# Patient Record
Sex: Female | Born: 1955
Health system: Southern US, Community
[De-identification: ages and names within clinical notes are randomized; demographics above are authoritative.]

## PROBLEM LIST (undated history)

## (undated) DIAGNOSIS — I10 Essential (primary) hypertension: Secondary | ICD-10-CM

## (undated) DIAGNOSIS — F32A Depression, unspecified: Secondary | ICD-10-CM

## (undated) DIAGNOSIS — F329 Major depressive disorder, single episode, unspecified: Secondary | ICD-10-CM

## (undated) DIAGNOSIS — F419 Anxiety disorder, unspecified: Secondary | ICD-10-CM

## (undated) DIAGNOSIS — K219 Gastro-esophageal reflux disease without esophagitis: Secondary | ICD-10-CM

## (undated) DIAGNOSIS — K759 Inflammatory liver disease, unspecified: Secondary | ICD-10-CM

## (undated) DIAGNOSIS — R112 Nausea with vomiting, unspecified: Secondary | ICD-10-CM

## (undated) DIAGNOSIS — E785 Hyperlipidemia, unspecified: Secondary | ICD-10-CM

## (undated) DIAGNOSIS — Z9889 Other specified postprocedural states: Secondary | ICD-10-CM

## (undated) HISTORY — PX: BREAST EXCISIONAL BIOPSY: SUR124

## (undated) HISTORY — DX: Hyperlipidemia, unspecified: E78.5

## (undated) HISTORY — DX: Essential (primary) hypertension: I10

## (undated) HISTORY — PX: CERVICAL BIOPSY  W/ LOOP ELECTRODE EXCISION: SUR135

---
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
## 1985-05-10 HISTORY — PX: TUBAL LIGATION: SHX77

## 1998-07-24 ENCOUNTER — Other Ambulatory Visit: Admission: RE | Admit: 1998-07-24 | Discharge: 1998-07-24 | Payer: Self-pay | Admitting: Obstetrics and Gynecology

## 1998-12-23 ENCOUNTER — Other Ambulatory Visit: Admission: RE | Admit: 1998-12-23 | Discharge: 1998-12-23 | Payer: Self-pay | Admitting: Obstetrics and Gynecology

## 1999-07-17 ENCOUNTER — Other Ambulatory Visit: Admission: RE | Admit: 1999-07-17 | Discharge: 1999-07-17 | Payer: Self-pay | Admitting: Obstetrics and Gynecology

## 2000-02-19 ENCOUNTER — Other Ambulatory Visit: Admission: RE | Admit: 2000-02-19 | Discharge: 2000-02-19 | Payer: Self-pay | Admitting: Obstetrics and Gynecology

## 2000-06-28 ENCOUNTER — Encounter: Payer: Self-pay | Admitting: *Deleted

## 2000-06-28 ENCOUNTER — Encounter: Admission: RE | Admit: 2000-06-28 | Discharge: 2000-06-28 | Payer: Self-pay | Admitting: *Deleted

## 2000-07-18 ENCOUNTER — Other Ambulatory Visit: Admission: RE | Admit: 2000-07-18 | Discharge: 2000-07-18 | Payer: Self-pay | Admitting: Obstetrics and Gynecology

## 2000-12-27 ENCOUNTER — Encounter: Admission: RE | Admit: 2000-12-27 | Discharge: 2000-12-27 | Payer: Self-pay | Admitting: *Deleted

## 2000-12-27 ENCOUNTER — Encounter: Payer: Self-pay | Admitting: *Deleted

## 2001-04-26 ENCOUNTER — Other Ambulatory Visit: Admission: RE | Admit: 2001-04-26 | Discharge: 2001-04-26 | Payer: Self-pay | Admitting: Obstetrics and Gynecology

## 2002-03-06 ENCOUNTER — Encounter: Payer: Self-pay | Admitting: *Deleted

## 2002-03-06 ENCOUNTER — Encounter: Admission: RE | Admit: 2002-03-06 | Discharge: 2002-03-06 | Payer: Self-pay | Admitting: *Deleted

## 2003-03-19 ENCOUNTER — Encounter: Admission: RE | Admit: 2003-03-19 | Discharge: 2003-03-19 | Payer: Self-pay | Admitting: *Deleted

## 2004-03-20 ENCOUNTER — Encounter: Admission: RE | Admit: 2004-03-20 | Discharge: 2004-03-20 | Payer: Self-pay | Admitting: Obstetrics and Gynecology

## 2005-05-20 ENCOUNTER — Encounter: Admission: RE | Admit: 2005-05-20 | Discharge: 2005-05-20 | Payer: Self-pay | Admitting: Obstetrics and Gynecology

## 2006-05-10 HISTORY — PX: LIVER BIOPSY: SHX301

## 2006-06-14 ENCOUNTER — Encounter: Admission: RE | Admit: 2006-06-14 | Discharge: 2006-06-14 | Payer: Self-pay | Admitting: Obstetrics and Gynecology

## 2007-02-24 ENCOUNTER — Ambulatory Visit: Payer: Self-pay | Admitting: Family Medicine

## 2007-05-31 ENCOUNTER — Ambulatory Visit: Payer: Self-pay | Admitting: Unknown Physician Specialty

## 2007-05-31 LAB — HM COLONOSCOPY

## 2007-06-26 ENCOUNTER — Encounter: Admission: RE | Admit: 2007-06-26 | Discharge: 2007-06-26 | Payer: Self-pay | Admitting: Obstetrics and Gynecology

## 2007-06-29 ENCOUNTER — Ambulatory Visit: Payer: Self-pay | Admitting: Surgery

## 2007-06-29 HISTORY — PX: CHOLECYSTECTOMY: SHX55

## 2008-07-02 ENCOUNTER — Encounter: Admission: RE | Admit: 2008-07-02 | Discharge: 2008-07-02 | Payer: Self-pay | Admitting: Obstetrics and Gynecology

## 2008-10-21 ENCOUNTER — Ambulatory Visit: Payer: Self-pay | Admitting: Unknown Physician Specialty

## 2009-01-22 ENCOUNTER — Ambulatory Visit: Payer: Self-pay | Admitting: Family Medicine

## 2009-07-07 ENCOUNTER — Encounter: Admission: RE | Admit: 2009-07-07 | Discharge: 2009-07-07 | Payer: Self-pay | Admitting: Obstetrics and Gynecology

## 2010-06-11 ENCOUNTER — Other Ambulatory Visit: Payer: Self-pay | Admitting: Obstetrics and Gynecology

## 2010-06-11 DIAGNOSIS — Z1239 Encounter for other screening for malignant neoplasm of breast: Secondary | ICD-10-CM

## 2010-07-08 ENCOUNTER — Ambulatory Visit
Admission: RE | Admit: 2010-07-08 | Discharge: 2010-07-08 | Disposition: A | Discharge status: Home / Self Care | Payer: BC Managed Care – PPO | Source: Ambulatory Visit | Attending: Obstetrics and Gynecology | Admitting: Obstetrics and Gynecology

## 2010-07-08 DIAGNOSIS — Z1239 Encounter for other screening for malignant neoplasm of breast: Secondary | ICD-10-CM

## 2010-08-06 ENCOUNTER — Ambulatory Visit: Payer: Self-pay | Admitting: Unknown Physician Specialty

## 2011-06-02 ENCOUNTER — Other Ambulatory Visit: Payer: Self-pay | Admitting: Obstetrics and Gynecology

## 2011-06-02 DIAGNOSIS — Z1231 Encounter for screening mammogram for malignant neoplasm of breast: Secondary | ICD-10-CM

## 2011-08-17 ENCOUNTER — Ambulatory Visit
Admission: RE | Admit: 2011-08-17 | Discharge: 2011-08-17 | Disposition: A | Discharge status: Home / Self Care | Payer: BC Managed Care – PPO | Source: Ambulatory Visit | Attending: Obstetrics and Gynecology | Admitting: Obstetrics and Gynecology

## 2011-08-17 DIAGNOSIS — Z1231 Encounter for screening mammogram for malignant neoplasm of breast: Secondary | ICD-10-CM

## 2012-07-19 ENCOUNTER — Other Ambulatory Visit: Payer: Self-pay

## 2012-07-19 ENCOUNTER — Ambulatory Visit: Payer: Self-pay | Admitting: Unknown Physician Specialty

## 2012-07-19 DIAGNOSIS — Z1231 Encounter for screening mammogram for malignant neoplasm of breast: Secondary | ICD-10-CM

## 2012-08-23 ENCOUNTER — Ambulatory Visit
Admission: RE | Admit: 2012-08-23 | Discharge: 2012-08-23 | Disposition: A | Discharge status: Home / Self Care | Payer: BC Managed Care – PPO | Source: Ambulatory Visit

## 2012-08-23 ENCOUNTER — Other Ambulatory Visit: Payer: Self-pay | Admitting: Obstetrics and Gynecology

## 2012-08-23 DIAGNOSIS — Z1231 Encounter for screening mammogram for malignant neoplasm of breast: Secondary | ICD-10-CM

## 2012-08-23 DIAGNOSIS — R928 Other abnormal and inconclusive findings on diagnostic imaging of breast: Secondary | ICD-10-CM

## 2012-09-06 ENCOUNTER — Ambulatory Visit
Admission: RE | Admit: 2012-09-06 | Discharge: 2012-09-06 | Disposition: A | Discharge status: Home / Self Care | Payer: BC Managed Care – PPO | Source: Ambulatory Visit | Attending: Obstetrics and Gynecology | Admitting: Obstetrics and Gynecology

## 2012-09-06 DIAGNOSIS — R928 Other abnormal and inconclusive findings on diagnostic imaging of breast: Secondary | ICD-10-CM

## 2013-11-06 LAB — LIPID PANEL
Cholesterol: 207 mg/dL — AB (ref 0–200)
HDL: 63 mg/dL (ref 35–70)
LDL Cholesterol: 126 mg/dL
LDl/HDL Ratio: 2
Triglycerides: 88 mg/dL (ref 40–160)

## 2013-11-06 LAB — HEPATIC FUNCTION PANEL
ALT: 39 U/L — AB (ref 7–35)
AST: 32 U/L (ref 13–35)

## 2013-11-06 LAB — HEMOGLOBIN A1C: HEMOGLOBIN A1C: 6 % (ref 4.0–6.0)

## 2014-04-16 LAB — HM DEXA SCAN

## 2014-08-10 ENCOUNTER — Other Ambulatory Visit: Payer: Self-pay

## 2014-08-10 DIAGNOSIS — F329 Major depressive disorder, single episode, unspecified: Secondary | ICD-10-CM | POA: Insufficient documentation

## 2014-08-10 DIAGNOSIS — G43009 Migraine without aura, not intractable, without status migrainosus: Secondary | ICD-10-CM | POA: Insufficient documentation

## 2014-08-10 DIAGNOSIS — G4700 Insomnia, unspecified: Secondary | ICD-10-CM | POA: Insufficient documentation

## 2014-08-10 DIAGNOSIS — K743 Primary biliary cirrhosis: Secondary | ICD-10-CM | POA: Insufficient documentation

## 2014-08-10 DIAGNOSIS — K219 Gastro-esophageal reflux disease without esophagitis: Secondary | ICD-10-CM | POA: Insufficient documentation

## 2014-08-10 DIAGNOSIS — I10 Essential (primary) hypertension: Secondary | ICD-10-CM | POA: Insufficient documentation

## 2014-08-10 DIAGNOSIS — Z0000 Encounter for general adult medical examination without abnormal findings: Secondary | ICD-10-CM | POA: Insufficient documentation

## 2014-08-10 DIAGNOSIS — F32A Depression, unspecified: Secondary | ICD-10-CM | POA: Insufficient documentation

## 2014-08-10 DIAGNOSIS — A048 Other specified bacterial intestinal infections: Secondary | ICD-10-CM | POA: Insufficient documentation

## 2014-08-10 DIAGNOSIS — G43809 Other migraine, not intractable, without status migrainosus: Secondary | ICD-10-CM | POA: Insufficient documentation

## 2014-08-10 DIAGNOSIS — E785 Hyperlipidemia, unspecified: Secondary | ICD-10-CM | POA: Insufficient documentation

## 2014-08-10 DIAGNOSIS — E7800 Pure hypercholesterolemia, unspecified: Secondary | ICD-10-CM | POA: Insufficient documentation

## 2014-09-20 ENCOUNTER — Other Ambulatory Visit: Payer: Self-pay | Admitting: Obstetrics and Gynecology

## 2014-09-20 DIAGNOSIS — R928 Other abnormal and inconclusive findings on diagnostic imaging of breast: Secondary | ICD-10-CM

## 2014-10-01 ENCOUNTER — Other Ambulatory Visit: Payer: Self-pay | Admitting: Obstetrics and Gynecology

## 2014-10-01 ENCOUNTER — Ambulatory Visit
Admission: RE | Admit: 2014-10-01 | Discharge: 2014-10-01 | Disposition: A | Discharge status: Home / Self Care | Payer: BLUE CROSS/BLUE SHIELD | Source: Ambulatory Visit | Attending: Obstetrics and Gynecology | Admitting: Obstetrics and Gynecology

## 2014-10-01 DIAGNOSIS — R928 Other abnormal and inconclusive findings on diagnostic imaging of breast: Secondary | ICD-10-CM

## 2014-10-02 ENCOUNTER — Other Ambulatory Visit: Payer: Self-pay | Admitting: Obstetrics and Gynecology

## 2014-10-02 DIAGNOSIS — R928 Other abnormal and inconclusive findings on diagnostic imaging of breast: Secondary | ICD-10-CM

## 2014-10-03 ENCOUNTER — Ambulatory Visit
Admission: RE | Admit: 2014-10-03 | Discharge: 2014-10-03 | Disposition: A | Discharge status: Home / Self Care | Payer: BLUE CROSS/BLUE SHIELD | Source: Ambulatory Visit | Attending: Obstetrics and Gynecology | Admitting: Obstetrics and Gynecology

## 2014-10-03 DIAGNOSIS — R928 Other abnormal and inconclusive findings on diagnostic imaging of breast: Secondary | ICD-10-CM

## 2014-10-03 HISTORY — PX: BREAST BIOPSY: SHX20

## 2014-10-09 ENCOUNTER — Ambulatory Visit (INDEPENDENT_AMBULATORY_CARE_PROVIDER_SITE_OTHER): Payer: BLUE CROSS/BLUE SHIELD | Admitting: Family Medicine

## 2014-10-09 ENCOUNTER — Encounter: Payer: Self-pay | Admitting: Family Medicine

## 2014-10-09 VITALS — BP 128/80 | HR 84 | Temp 98.2°F | Resp 16 | Ht 65.5 in | Wt 212.0 lb

## 2014-10-09 DIAGNOSIS — I10 Essential (primary) hypertension: Secondary | ICD-10-CM

## 2014-10-09 DIAGNOSIS — E7800 Pure hypercholesterolemia, unspecified: Secondary | ICD-10-CM

## 2014-10-09 DIAGNOSIS — K743 Primary biliary cirrhosis: Secondary | ICD-10-CM

## 2014-10-09 DIAGNOSIS — R739 Hyperglycemia, unspecified: Secondary | ICD-10-CM | POA: Diagnosis not present

## 2014-10-09 NOTE — Progress Notes (Signed)
Patient ID: Michaela Hardy, female   DOB: [birth year], 59 y.o.   MRN: [id number] Patient: Michaela Hardy, Female    DOB: [birth year], 59 y.o.   MRN: [id number] Visit Date: 10/09/2014  Today's Provider: Margarita Rana, MD   Chief Complaint  Patient presents with  . Annual Exam   Subjective:  Michaela Hardy is a 59 y.o. female who presents today for health maintenance and complete physical. She feels well. She reports exercising 3 times a week cardio and weights. Patient reports that over the weekend she walks a few miles. She reports she is sleeping well.   Review of Systems  Constitutional: Negative.   HENT: Negative.   Eyes: Positive for discharge. Negative for photophobia, pain, redness, itching and visual disturbance.  Respiratory: Negative.   Cardiovascular: Negative.   Gastrointestinal: Negative.   Endocrine: Negative.   Genitourinary: Negative.   Allergic/Immunologic: Negative.   Neurological: Negative.   Hematological: Negative.   Psychiatric/Behavioral: Negative.     History   Social History  . Marital Status: Married    Spouse Name: N/A  . Number of Children: N/A  . Years of Education: N/A   Occupational History  . Not on file.   Social History Main Topics  . Smoking status: Former Smoker -- 1.50 packs/day for 40 years    Types: Cigarettes    Quit date: 05/10/2004  . Smokeless tobacco: Never Used  . Alcohol Use: 3.0 oz/week    5 Glasses of wine per week  . Drug Use: No  . Sexual Activity: Not Currently   Other Topics Concern  . Not on file   Social History Narrative   Patient Active Problem List   Diagnosis Date Noted  . Atypical migraine 08/10/2014  . Routine general medical examination at a health care facility 08/10/2014  . Clinical depression 08/10/2014  . Acid reflux 08/10/2014  . Helicobacter pylori gastrointestinal tract infection 08/10/2014  . Hypercholesteremia 08/10/2014  . BP (high blood pressure) 08/10/2014  . Cannot sleep 08/10/2014   . Cirrhosis, primary biliary 08/10/2014     Past Medical History  Diagnosis Date  . Hyperlipidemia   . Hypertension     Past Surgical History  Procedure Laterality Date  . Appendectomy  1976  . Cholecystectomy  06/29/2007  . Tubal ligation  1987  . Liver biopsy  2008  . Breast biopsy Left 10/03/2014    Dr. Autumn Patty    Her family history includes Cancer in her father; Cerebrovascular Accident in her father; Coronary artery disease in her father; Glaucoma in her father; Heart attack in her father; Hypertension in her father and mother; Pancreatitis in her mother; Parkinson's disease in her maternal grandmother; Thyroid disease in her mother.    Previous Medications   CALCIUM CARBONATE (OS-CAL) 600 MG TABS TABLET    Take 1 tablet by mouth 2 (two) times daily.   CITALOPRAM (CELEXA) 20 MG TABLET    Take 20 mg by mouth once.   LOSARTAN-HYDROCHLOROTHIAZIDE (HYZAAR) 50-12.5 MG PER TABLET    Take 1 tablet by mouth daily.   MULTIPLE VITAMIN TABLET    Take 1 tablet by mouth daily.   OMEPRAZOLE (PRILOSEC) 20 MG CAPSULE    Take 1 capsule by mouth daily.   SIMVASTATIN (ZOCOR) 10 MG TABLET    Take 1 tablet by mouth daily.   URSODIOL (ACTIGALL) 300 MG CAPSULE    Take 2 capsules by mouth 2 (two) times daily.    Patient Care Team: Margarita Rana, MD as PCP -  General (Family Medicine)     Objective:  BP 128/80 mmHg  Pulse 84  Temp(Src) 98.2 F (36.8 C) (Oral)  Resp 16  Ht 5' 5.5" (1.664 m)  Wt 212 lb (96.163 kg)  BMI 34.73 kg/m2 Vitals:  Filed Vitals:   10/09/14 1546  BP: 128/80  Pulse: 84  Temp: 98.2 F (36.8 C)  TempSrc: Oral  Resp: 16  Height: 5' 5.5" (1.664 m)  Weight: 212 lb (96.163 kg)    Physical Exam  Constitutional: She is oriented to person, place, and time. She appears well-developed and well-nourished.  HENT:  Head: Normocephalic and atraumatic.  Right Ear: Tympanic membrane, external ear and ear canal normal.  Left Ear: Tympanic membrane, external ear and ear  canal normal.  Nose: Nose normal.  Mouth/Throat: Uvula is midline, oropharynx is clear and moist and mucous membranes are normal.  Eyes: Conjunctivae, EOM and lids are normal. Pupils are equal, round, and reactive to light.  Neck: Trachea normal and normal range of motion. Neck supple. Carotid bruit is not present. No thyroid mass and no thyromegaly present.  Cardiovascular: Normal rate, regular rhythm and normal heart sounds.   Pulmonary/Chest: Effort normal and breath sounds normal.  Abdominal: Soft. Normal appearance and bowel sounds are normal. There is no hepatosplenomegaly. There is no tenderness.  Musculoskeletal: Normal range of motion.  Lymphadenopathy:    She has no cervical adenopathy.    She has no axillary adenopathy.  Neurological: She is alert and oriented to person, place, and time. She has normal strength. No cranial nerve deficit.  Skin: Skin is warm, dry and intact.  Psychiatric: She has a normal mood and affect. Her speech is normal and behavior is normal. Judgment and thought content normal. Cognition and memory are normal.     Depression Screen PHQ 2/9 Scores 10/09/2014  PHQ - 2 Score 0      Assessment & Plan:     Routine Health Maintenance and Physical Exam  Will check labs today also.    Exercise Activities and Dietary recommendations Goals    . Exercise 150 minutes per week (moderate activity)       Immunization History  Administered Date(s) Administered  . Hepatitis A 11/27/2008, 05/30/2009  . Hepatitis B 11/27/2008, 12/26/2008, 05/30/2009  . Td 12/03/1998  . Tdap 06/29/2012    Health Maintenance  Topic Date Due  . HIV Screening  06/07/1971  . INFLUENZA VACCINE  12/09/2014  . MAMMOGRAM  10/02/2016  . COLONOSCOPY  05/30/2017  . PAP SMEAR  09/23/2017  . TETANUS/TDAP  06/29/2022      Discussed health benefits of physical activity, and encouraged her to engage in regular exercise appropriate for her age and condition.     ------------------------------------------------------------------------------------------------------------  Patient seen and examined by Dr. Jerrell Belfast, and note scribed by Philbert Riser. Dimas, CMA.  I have reviewed the document for accuracy and completeness and I agree with above. Jerrell Belfast, MD

## 2014-10-16 LAB — LIPID PANEL
Chol/HDL Ratio: 3.8 ratio units (ref 0.0–4.4)
Cholesterol, Total: 231 mg/dL — ABNORMAL HIGH (ref 100–199)
HDL: 61 mg/dL (ref >39)
LDL Calculated: 146 mg/dL — ABNORMAL HIGH (ref 0–99)
TRIGLYCERIDES: 122 mg/dL (ref 0–149)
VLDL CHOLESTEROL CAL: 24 mg/dL (ref 5–40)

## 2014-10-16 LAB — COMPREHENSIVE METABOLIC PANEL
A/G RATIO: 1.5 (ref 1.1–2.5)
ALK PHOS: 106 IU/L (ref 39–117)
ALT: 27 IU/L (ref 0–32)
AST: 24 IU/L (ref 0–40)
Albumin: 4.4 g/dL (ref 3.5–5.5)
BUN/Creatinine Ratio: 15 (ref 9–23)
BUN: 11 mg/dL (ref 6–24)
Bilirubin Total: 0.3 mg/dL (ref 0.0–1.2)
CALCIUM: 9.5 mg/dL (ref 8.7–10.2)
CO2: 27 mmol/L (ref 18–29)
CREATININE: 0.72 mg/dL (ref 0.57–1.00)
Chloride: 97 mmol/L (ref 97–108)
GFR calc Af Amer: 107 mL/min/{1.73_m2} (ref >59)
GFR calc non Af Amer: 93 mL/min/{1.73_m2} (ref >59)
GLUCOSE: 117 mg/dL — AB (ref 65–99)
Globulin, Total: 2.9 g/dL (ref 1.5–4.5)
Potassium: 4.8 mmol/L (ref 3.5–5.2)
Sodium: 140 mmol/L (ref 134–144)
TOTAL PROTEIN: 7.3 g/dL (ref 6.0–8.5)

## 2014-10-16 LAB — CBC WITH DIFFERENTIAL/PLATELET
Basophils Absolute: 0 10*3/uL (ref 0.0–0.2)
Basos: 1 %
EOS (ABSOLUTE): 0.1 10*3/uL (ref 0.0–0.4)
Eos: 1 %
HEMATOCRIT: 38.4 % (ref 34.0–46.6)
Hemoglobin: 12.8 g/dL (ref 11.1–15.9)
IMMATURE GRANULOCYTES: 0 %
Immature Grans (Abs): 0 10*3/uL (ref 0.0–0.1)
LYMPHS: 25 %
Lymphocytes Absolute: 1.2 10*3/uL (ref 0.7–3.1)
MCH: 30.3 pg (ref 26.6–33.0)
MCHC: 33.3 g/dL (ref 31.5–35.7)
MCV: 91 fL (ref 79–97)
Monocytes Absolute: 0.5 10*3/uL (ref 0.1–0.9)
Monocytes: 11 %
NEUTROS PCT: 62 %
Neutrophils Absolute: 3 10*3/uL (ref 1.4–7.0)
Platelets: 373 10*3/uL (ref 150–379)
RBC: 4.23 x10E6/uL (ref 3.77–5.28)
RDW: 14.3 % (ref 12.3–15.4)
WBC: 4.9 10*3/uL (ref 3.4–10.8)

## 2014-10-16 LAB — HEMOGLOBIN A1C
Est. average glucose Bld gHb Est-mCnc: 134 mg/dL
HEMOGLOBIN A1C: 6.3 % — AB (ref 4.8–5.6)

## 2014-10-16 LAB — TSH: TSH: 3.78 u[IU]/mL (ref 0.450–4.500)

## 2014-10-22 ENCOUNTER — Other Ambulatory Visit: Payer: Self-pay | Admitting: General Surgery

## 2014-10-22 DIAGNOSIS — N6022 Fibroadenosis of left breast: Secondary | ICD-10-CM

## 2014-11-04 ENCOUNTER — Other Ambulatory Visit: Payer: Self-pay | Admitting: Family Medicine

## 2014-11-04 DIAGNOSIS — F329 Major depressive disorder, single episode, unspecified: Secondary | ICD-10-CM

## 2014-11-04 DIAGNOSIS — F32A Depression, unspecified: Secondary | ICD-10-CM

## 2014-11-05 ENCOUNTER — Other Ambulatory Visit: Payer: Self-pay | Admitting: General Surgery

## 2014-11-05 DIAGNOSIS — N6022 Fibroadenosis of left breast: Secondary | ICD-10-CM

## 2014-11-15 DIAGNOSIS — K743 Primary biliary cirrhosis: Secondary | ICD-10-CM | POA: Insufficient documentation

## 2014-11-18 ENCOUNTER — Encounter (HOSPITAL_BASED_OUTPATIENT_CLINIC_OR_DEPARTMENT_OTHER)
Admission: RE | Admit: 2014-11-18 | Discharge: 2014-11-18 | Disposition: A | Discharge status: Home / Self Care | Payer: BLUE CROSS/BLUE SHIELD | Source: Ambulatory Visit | Attending: General Surgery | Admitting: General Surgery

## 2014-11-18 ENCOUNTER — Ambulatory Visit
Admission: RE | Admit: 2014-11-18 | Discharge: 2014-11-18 | Disposition: A | Discharge status: Home / Self Care | Payer: BLUE CROSS/BLUE SHIELD | Source: Ambulatory Visit | Attending: General Surgery | Admitting: General Surgery

## 2014-11-18 ENCOUNTER — Other Ambulatory Visit: Payer: Self-pay

## 2014-11-18 ENCOUNTER — Encounter (HOSPITAL_BASED_OUTPATIENT_CLINIC_OR_DEPARTMENT_OTHER): Payer: Self-pay | Admitting: *Deleted

## 2014-11-18 DIAGNOSIS — I10 Essential (primary) hypertension: Secondary | ICD-10-CM | POA: Diagnosis not present

## 2014-11-18 DIAGNOSIS — Z79899 Other long term (current) drug therapy: Secondary | ICD-10-CM | POA: Diagnosis not present

## 2014-11-18 DIAGNOSIS — R921 Mammographic calcification found on diagnostic imaging of breast: Secondary | ICD-10-CM | POA: Diagnosis not present

## 2014-11-18 DIAGNOSIS — N6022 Fibroadenosis of left breast: Secondary | ICD-10-CM

## 2014-11-18 DIAGNOSIS — Z859 Personal history of malignant neoplasm, unspecified: Secondary | ICD-10-CM | POA: Diagnosis not present

## 2014-11-18 DIAGNOSIS — L905 Scar conditions and fibrosis of skin: Secondary | ICD-10-CM | POA: Diagnosis not present

## 2014-11-18 DIAGNOSIS — K219 Gastro-esophageal reflux disease without esophagitis: Secondary | ICD-10-CM | POA: Diagnosis not present

## 2014-11-18 DIAGNOSIS — R9431 Abnormal electrocardiogram [ECG] [EKG]: Secondary | ICD-10-CM | POA: Diagnosis not present

## 2014-11-18 DIAGNOSIS — Z87891 Personal history of nicotine dependence: Secondary | ICD-10-CM | POA: Diagnosis not present

## 2014-11-18 DIAGNOSIS — Z6833 Body mass index (BMI) 33.0-33.9, adult: Secondary | ICD-10-CM | POA: Diagnosis not present

## 2014-11-18 LAB — BASIC METABOLIC PANEL
Anion gap: 10 (ref 5–15)
BUN: 7 mg/dL (ref 6–20)
CALCIUM: 9.2 mg/dL (ref 8.9–10.3)
CO2: 28 mmol/L (ref 22–32)
Chloride: 98 mmol/L — ABNORMAL LOW (ref 101–111)
Creatinine, Ser: 0.68 mg/dL (ref 0.44–1.00)
GFR calc non Af Amer: >60 mL/min (ref >60)
GLUCOSE: 104 mg/dL — AB (ref 65–99)
Potassium: 3.6 mmol/L (ref 3.5–5.1)
SODIUM: 136 mmol/L (ref 135–145)

## 2014-11-19 ENCOUNTER — Encounter (HOSPITAL_BASED_OUTPATIENT_CLINIC_OR_DEPARTMENT_OTHER): Payer: Self-pay

## 2014-11-21 ENCOUNTER — Encounter (HOSPITAL_BASED_OUTPATIENT_CLINIC_OR_DEPARTMENT_OTHER): Payer: Self-pay | Admitting: Anesthesiology

## 2014-11-21 ENCOUNTER — Ambulatory Visit
Admission: RE | Admit: 2014-11-21 | Discharge: 2014-11-21 | Disposition: A | Discharge status: Home / Self Care | Payer: BLUE CROSS/BLUE SHIELD | Source: Ambulatory Visit | Attending: General Surgery | Admitting: General Surgery

## 2014-11-21 ENCOUNTER — Ambulatory Visit (HOSPITAL_BASED_OUTPATIENT_CLINIC_OR_DEPARTMENT_OTHER): Payer: BLUE CROSS/BLUE SHIELD | Admitting: Anesthesiology

## 2014-11-21 ENCOUNTER — Ambulatory Visit (HOSPITAL_BASED_OUTPATIENT_CLINIC_OR_DEPARTMENT_OTHER)
Admission: RE | Admit: 2014-11-21 | Discharge: 2014-11-21 | Disposition: A | Discharge status: Home / Self Care | Payer: BLUE CROSS/BLUE SHIELD | Source: Ambulatory Visit | Attending: General Surgery | Admitting: General Surgery

## 2014-11-21 ENCOUNTER — Encounter (HOSPITAL_BASED_OUTPATIENT_CLINIC_OR_DEPARTMENT_OTHER)
Admission: RE | Disposition: A | Discharge status: Home / Self Care | Payer: Self-pay | Source: Ambulatory Visit | Attending: General Surgery

## 2014-11-21 DIAGNOSIS — N6022 Fibroadenosis of left breast: Secondary | ICD-10-CM

## 2014-11-21 DIAGNOSIS — R9431 Abnormal electrocardiogram [ECG] [EKG]: Secondary | ICD-10-CM | POA: Insufficient documentation

## 2014-11-21 DIAGNOSIS — I10 Essential (primary) hypertension: Secondary | ICD-10-CM | POA: Insufficient documentation

## 2014-11-21 DIAGNOSIS — R921 Mammographic calcification found on diagnostic imaging of breast: Secondary | ICD-10-CM | POA: Insufficient documentation

## 2014-11-21 DIAGNOSIS — Z87891 Personal history of nicotine dependence: Secondary | ICD-10-CM | POA: Insufficient documentation

## 2014-11-21 DIAGNOSIS — Z79899 Other long term (current) drug therapy: Secondary | ICD-10-CM | POA: Insufficient documentation

## 2014-11-21 DIAGNOSIS — L905 Scar conditions and fibrosis of skin: Secondary | ICD-10-CM | POA: Insufficient documentation

## 2014-11-21 DIAGNOSIS — K219 Gastro-esophageal reflux disease without esophagitis: Secondary | ICD-10-CM | POA: Insufficient documentation

## 2014-11-21 DIAGNOSIS — Z859 Personal history of malignant neoplasm, unspecified: Secondary | ICD-10-CM | POA: Insufficient documentation

## 2014-11-21 DIAGNOSIS — Z6833 Body mass index (BMI) 33.0-33.9, adult: Secondary | ICD-10-CM | POA: Insufficient documentation

## 2014-11-21 HISTORY — DX: Depression, unspecified: F32.A

## 2014-11-21 HISTORY — DX: Gastro-esophageal reflux disease without esophagitis: K21.9

## 2014-11-21 HISTORY — DX: Other specified postprocedural states: Z98.890

## 2014-11-21 HISTORY — DX: Anxiety disorder, unspecified: F41.9

## 2014-11-21 HISTORY — PX: BREAST LUMPECTOMY WITH RADIOACTIVE SEED LOCALIZATION: SHX6424

## 2014-11-21 HISTORY — DX: Inflammatory liver disease, unspecified: K75.9

## 2014-11-21 HISTORY — DX: Nausea with vomiting, unspecified: R11.2

## 2014-11-21 HISTORY — DX: Major depressive disorder, single episode, unspecified: F32.9

## 2014-11-21 LAB — POCT HEMOGLOBIN-HEMACUE: Hemoglobin: 13.4 g/dL (ref 12.0–15.0)

## 2014-11-21 SURGERY — BREAST LUMPECTOMY WITH RADIOACTIVE SEED LOCALIZATION
Anesthesia: General | Site: Breast | Laterality: Left

## 2014-11-21 MED ORDER — OXYCODONE-ACETAMINOPHEN 5-325 MG PO TABS: 1.0000 | ORAL_TABLET | ORAL | Status: DC | PRN

## 2014-11-21 MED ORDER — FENTANYL CITRATE (PF) 100 MCG/2ML IJ SOLN
INTRAMUSCULAR | Status: DC | PRN
  Administered 2014-11-21: 100 ug via INTRAVENOUS

## 2014-11-21 MED ORDER — LACTATED RINGERS IV SOLN
INTRAVENOUS | Status: DC
  Administered 2014-11-21 (×2): via INTRAVENOUS

## 2014-11-21 MED ORDER — HYDROMORPHONE HCL 1 MG/ML IJ SOLN
INTRAMUSCULAR | Status: AC
  Filled 2014-11-21: qty 1

## 2014-11-21 MED ORDER — FENTANYL CITRATE (PF) 100 MCG/2ML IJ SOLN: 50.0000 ug | INTRAMUSCULAR | Status: DC | PRN

## 2014-11-21 MED ORDER — PROPOFOL 10 MG/ML IV BOLUS
INTRAVENOUS | Status: DC | PRN
  Administered 2014-11-21: 200 mg via INTRAVENOUS

## 2014-11-21 MED ORDER — SCOPOLAMINE 1 MG/3DAYS TD PT72: 1.0000 | MEDICATED_PATCH | Freq: Once | TRANSDERMAL | Status: DC | PRN

## 2014-11-21 MED ORDER — ONDANSETRON HCL 4 MG/2ML IJ SOLN
INTRAMUSCULAR | Status: DC | PRN
Start: 2014-11-21 — End: 2014-11-21
  Administered 2014-11-21: 4 mg via INTRAVENOUS

## 2014-11-21 MED ORDER — LIDOCAINE HCL (CARDIAC) 20 MG/ML IV SOLN
INTRAVENOUS | Status: DC | PRN
  Administered 2014-11-21: 50 mg via INTRAVENOUS

## 2014-11-21 MED ORDER — MIDAZOLAM HCL 2 MG/2ML IJ SOLN
1.0000 mg | INTRAMUSCULAR | Status: DC | PRN
Start: 2014-11-21 — End: 2014-11-21

## 2014-11-21 MED ORDER — BUPIVACAINE HCL (PF) 0.25 % IJ SOLN
INTRAMUSCULAR | Status: DC | PRN
  Administered 2014-11-21: 20 mL

## 2014-11-21 MED ORDER — MIDAZOLAM HCL 5 MG/5ML IJ SOLN
INTRAMUSCULAR | Status: DC | PRN
  Administered 2014-11-21: 2 mg via INTRAVENOUS

## 2014-11-21 MED ORDER — SCOPOLAMINE 1 MG/3DAYS TD PT72
MEDICATED_PATCH | TRANSDERMAL | Status: AC
  Filled 2014-11-21: qty 1

## 2014-11-21 MED ORDER — CEFAZOLIN SODIUM-DEXTROSE 2-3 GM-% IV SOLR
2.0000 g | INTRAVENOUS | Status: AC
  Administered 2014-11-21: 2 g via INTRAVENOUS

## 2014-11-21 MED ORDER — OXYCODONE HCL 5 MG PO TABS: 5.0000 mg | ORAL_TABLET | Freq: Once | ORAL | Status: DC | PRN

## 2014-11-21 MED ORDER — CHLORHEXIDINE GLUCONATE 4 % EX LIQD: 1.0000 "application " | Freq: Once | CUTANEOUS | Status: DC

## 2014-11-21 MED ORDER — MEPERIDINE HCL 25 MG/ML IJ SOLN: 6.2500 mg | INTRAMUSCULAR | Status: DC | PRN

## 2014-11-21 MED ORDER — HYDROMORPHONE HCL 1 MG/ML IJ SOLN
0.2500 mg | INTRAMUSCULAR | Status: DC | PRN
  Administered 2014-11-21: 0.25 mg via INTRAVENOUS
  Administered 2014-11-21: 0.5 mg via INTRAVENOUS

## 2014-11-21 MED ORDER — FENTANYL CITRATE (PF) 100 MCG/2ML IJ SOLN
INTRAMUSCULAR | Status: AC
  Filled 2014-11-21: qty 4

## 2014-11-21 MED ORDER — GLYCOPYRROLATE 0.2 MG/ML IJ SOLN: 0.2000 mg | Freq: Once | INTRAMUSCULAR | Status: DC | PRN

## 2014-11-21 MED ORDER — DEXAMETHASONE SODIUM PHOSPHATE 4 MG/ML IJ SOLN
INTRAMUSCULAR | Status: DC | PRN
  Administered 2014-11-21: 10 mg via INTRAVENOUS

## 2014-11-21 MED ORDER — EPHEDRINE SULFATE 50 MG/ML IJ SOLN
INTRAMUSCULAR | Status: DC | PRN
  Administered 2014-11-21 (×2): 10 mg via INTRAVENOUS

## 2014-11-21 MED ORDER — OXYCODONE HCL 5 MG/5ML PO SOLN: 5.0000 mg | Freq: Once | ORAL | Status: DC | PRN

## 2014-11-21 MED ORDER — MIDAZOLAM HCL 2 MG/2ML IJ SOLN
INTRAMUSCULAR | Status: AC
  Filled 2014-11-21: qty 2

## 2014-11-21 SURGICAL SUPPLY — 37 items
APPLIER CLIP 9.375 MED OPEN (MISCELLANEOUS) ×3
BLADE SURG 15 STRL LF DISP TIS (BLADE) ×1 IMPLANT
BLADE SURG 15 STRL SS (BLADE) ×2
CANISTER SUC SOCK COL 7IN (MISCELLANEOUS) ×3 IMPLANT
CANISTER SUCT 1200ML W/VALVE (MISCELLANEOUS) ×3 IMPLANT
CHLORAPREP W/TINT 26ML (MISCELLANEOUS) ×3 IMPLANT
CLIP APPLIE 9.375 MED OPEN (MISCELLANEOUS) ×1 IMPLANT
COVER BACK TABLE 60X90IN (DRAPES) ×3 IMPLANT
COVER MAYO STAND STRL (DRAPES) ×3 IMPLANT
COVER PROBE W GEL 5X96 (DRAPES) ×3 IMPLANT
DECANTER SPIKE VIAL GLASS SM (MISCELLANEOUS) IMPLANT
DEVICE DUBIN W/COMP PLATE 8390 (MISCELLANEOUS) ×3 IMPLANT
DRAPE LAPAROSCOPIC ABDOMINAL (DRAPES) IMPLANT
DRAPE UTILITY XL STRL (DRAPES) ×3 IMPLANT
ELECT COATED BLADE 2.86 ST (ELECTRODE) ×3 IMPLANT
ELECT REM PT RETURN 9FT ADLT (ELECTROSURGICAL) ×3
ELECTRODE REM PT RTRN 9FT ADLT (ELECTROSURGICAL) ×1 IMPLANT
GLOVE BIO SURGEON STRL SZ7.5 (GLOVE) ×6 IMPLANT
GOWN STRL REUS W/ TWL LRG LVL3 (GOWN DISPOSABLE) ×2 IMPLANT
GOWN STRL REUS W/TWL LRG LVL3 (GOWN DISPOSABLE) ×4
KIT MARKER MARGIN INK (KITS) ×3 IMPLANT
LIQUID BAND (GAUZE/BANDAGES/DRESSINGS) ×3 IMPLANT
NEEDLE HYPO 25X1 1.5 SAFETY (NEEDLE) ×3 IMPLANT
NS IRRIG 1000ML POUR BTL (IV SOLUTION) ×3 IMPLANT
PACK BASIN DAY SURGERY FS (CUSTOM PROCEDURE TRAY) ×3 IMPLANT
PENCIL BUTTON HOLSTER BLD 10FT (ELECTRODE) ×3 IMPLANT
SLEEVE SCD COMPRESS KNEE MED (MISCELLANEOUS) ×3 IMPLANT
SPONGE LAP 18X18 X RAY DECT (DISPOSABLE) ×3 IMPLANT
SUT MON AB 4-0 PC3 18 (SUTURE) ×3 IMPLANT
SUT SILK 2 0 SH (SUTURE) IMPLANT
SUT VICRYL 3-0 CR8 SH (SUTURE) ×3 IMPLANT
SYR CONTROL 10ML LL (SYRINGE) IMPLANT
TOWEL OR 17X24 6PK STRL BLUE (TOWEL DISPOSABLE) ×3 IMPLANT
TOWEL OR NON WOVEN STRL DISP B (DISPOSABLE) ×3 IMPLANT
TUBE CONNECTING 20'X1/4 (TUBING) ×1
TUBE CONNECTING 20X1/4 (TUBING) ×2 IMPLANT
YANKAUER SUCT BULB TIP NO VENT (SUCTIONS) IMPLANT

## 2014-11-21 NOTE — H&P (Signed)
Michaela Hardy 4/43/1540 08:67 AM Location: [facility] Patient #: [id number] DOB: 01/07/1956 Married / Language: Cleophus Molt / Race: White Female  History of Present Illness Sammuel Hines. Marlou Starks MD; 10/22/2014 12:45 PM) Patient words: np.  The patient is a 59 year old female who presents with a breast mass. We are asked to see the patient in consultation by Dr. Venia Minks to evaluate her for an abnormal mammogram. The patient is a 59 year old white female who recently went for a routine screening mammogram. At that time she was found to have an abnormal area of architectural distortion in the 12 o'clock position of the left breast. This was biopsied and came back as a complex sclerosing lesion. She denies any breast pain or discharge from the nipple. She has no personal or family history of breast cancer. She does not take any hormone replacement.   Other Problems Yehuda Mao, RMA; 10/22/2014 11:29 AM) High blood pressure Other disease, cancer, significant illness  Past Surgical History Yehuda Mao, RMA; 10/22/2014 11:29 AM) Appendectomy Breast Biopsy Left. Colon Polyp Removal - Colonoscopy Gallbladder Surgery - Laparoscopic Oral Surgery  Diagnostic Studies History Yehuda Mao, RMA; 10/22/2014 11:29 AM) Colonoscopy 5-10 years ago Mammogram within last year Pap Smear 1-5 years ago  Allergies Shirlean Mylar Gwynn, RMA; 10/22/2014 11:29 AM) No Known Drug Allergies06/14/2016  Medication History Shirlean Mylar Gwynn, RMA; 10/22/2014 11:32 AM) Calcium 600 (1500MG  Tablet, Oral daily) Active. CeleXA (20MG  Tablet, Oral daily) Active. Hyzaar (50-12.5MG  Tablet, Oral daily) Active. Multiple Vitamin (Oral daily) Active. PriLOSEC (20MG  Capsule DR, Oral daily) Active. Zocor (10MG  Tablet, Oral daily) Active. Actigall (300MG  Capsule, Oral daily) Active. Medications Reconciled  Social History Yehuda Mao, RMA; 10/22/2014 11:29 AM) Alcohol use Moderate alcohol use. Caffeine use  Coffee. No drug use Tobacco use Former smoker.  Family History Yehuda Mao, [place]; 10/22/2014 11:29 AM) Cancer Father. Cerebrovascular Accident Father. Heart Disease Father. Heart disease in female family member before age 38 Hypertension Father, Mother. Kidney Disease Father. Thyroid problems Mother.  Pregnancy / Birth History Yehuda Mao, RMA; 10/22/2014 11:29 AM) Age at menarche 64 years. Age of menopause 35-55 Gravida 2 Maternal age 16-30 Para 2  Review of Systems Yehuda Mao RMA; 10/22/2014 11:29 AM) General Not Present- Appetite Loss, Chills, Fatigue, Fever, Night Sweats, Weight Gain and Weight Loss. Skin Not Present- Change in Wart/Mole, Dryness, Hives, Jaundice, New Lesions, Non-Healing Wounds, Rash and Ulcer. HEENT Present- Wears glasses/contact lenses. Not Present- Earache, Hearing Loss, Hoarseness, Nose Bleed, Oral Ulcers, Ringing in the Ears, Seasonal Allergies, Sinus Pain, Sore Throat, Visual Disturbances and Yellow Eyes. Respiratory Not Present- Bloody sputum, Chronic Cough, Difficulty Breathing, Snoring and Wheezing. Breast Present- Breast Mass. Not Present- Breast Pain, Nipple Discharge and Skin Changes. Cardiovascular Not Present- Chest Pain, Difficulty Breathing Lying Down, Leg Cramps, Palpitations, Rapid Heart Rate, Shortness of Breath and Swelling of Extremities. Gastrointestinal Not Present- Abdominal Pain, Bloating, Bloody Stool, Change in Bowel Habits, Chronic diarrhea, Constipation, Difficulty Swallowing, Excessive gas, Gets full quickly at meals, Hemorrhoids, Indigestion, Nausea, Rectal Pain and Vomiting. Female Genitourinary Not Present- Frequency, Nocturia, Painful Urination, Pelvic Pain and Urgency. Musculoskeletal Not Present- Back Pain, Joint Pain, Joint Stiffness, Muscle Pain, Muscle Weakness and Swelling of Extremities. Neurological Not Present- Decreased Memory, Fainting, Headaches, Numbness, Seizures, Tingling, Tremor, Trouble walking and  Weakness. Psychiatric Not Present- Anxiety, Bipolar, Change in Sleep Pattern, Depression, Fearful and Frequent crying. Endocrine Present- Hot flashes. Not Present- Cold Intolerance, Excessive Hunger, Hair Changes, Heat Intolerance and New Diabetes. Hematology Not Present- Easy Bruising, Excessive bleeding, Gland problems,  HIV and Persistent Infections.   Vitals (Robin Gwynn RMA; 10/22/2014 11:33 AM) 10/22/2014 11:32 AM Weight: 208.6 lb Height: 67in Body Surface Area: 2.11 m Body Mass Index: 32.67 kg/m Temp.: 98.44F  Pulse: 80 (Regular)  BP: 160/90 (Sitting, Left Arm, Standard)    Physical Exam Eddie Dibbles S. Marlou Starks MD; 10/22/2014 12:46 PM) General Mental Status-Alert. General Appearance-Consistent with stated age. Hydration-Well hydrated. Voice-Normal.  Head and Neck Head-normocephalic, atraumatic with no lesions or palpable masses. Trachea-midline. Thyroid Gland Characteristics - normal size and consistency.  Eye Eyeball - Bilateral-Extraocular movements intact. Sclera/Conjunctiva - Bilateral-No scleral icterus.  Chest and Lung Exam Chest and lung exam reveals -quiet, even and easy respiratory effort with no use of accessory muscles and on auscultation, normal breath sounds, no adventitious sounds and normal vocal resonance. Inspection Chest Wall - Normal. Back - normal.  Breast Note: There is no palpable mass in either breast. There is no palpable axillary, supraclavicular, or cervical lymphadenopathy.   Cardiovascular Cardiovascular examination reveals -normal heart sounds, regular rate and rhythm with no murmurs and normal pedal pulses bilaterally.  Abdomen Note: The abdomen is soft and nontender. There is no palpable mass.   Neurologic Neurologic evaluation reveals -alert and oriented x 3 with no impairment of recent or remote memory. Mental Status-Normal.  Musculoskeletal Normal Exam - Left-Upper Extremity Strength Normal and  Lower Extremity Strength Normal. Normal Exam - Right-Upper Extremity Strength Normal and Lower Extremity Strength Normal.  Lymphatic Head & Neck  General Head & Neck Lymphatics: Bilateral - Description - Normal. Axillary  General Axillary Region: Bilateral - Description - Normal. Tenderness - Non Tender. Femoral & Inguinal  Generalized Femoral & Inguinal Lymphatics: Bilateral - Description - Normal. Tenderness - Non Tender.    Assessment & Plan Eddie Dibbles S. Marlou Starks MD; 10/22/2014 12:24 PM) SCLEROSING ADENOSIS OF BREAST, LEFT (610.2  N60.22) Impression: The patient appears to have a complex sclerosing lesion in the 12 o'clock position of the left breast. Because of its appearance and because it is considered a high risk lesion the recommendation is to have this area removed. I have discussed with her in detail the risks and benefits of the operation to do this as well as some of the technical aspects and she understands and wishes to proceed. I will plan for a left breast radioactive seed localized lumpectomy Current Plans  Pt Education - Breast Diseases: discussed with patient and provided information.   Signed by Luella Cook, MD (10/22/2014 12:46 PM)

## 2014-11-21 NOTE — Anesthesia Procedure Notes (Signed)
Procedure Name: LMA Insertion Date/Time: 11/21/2014 11:58 AM Performed by: Lieutenant Diego Pre-anesthesia Checklist: Patient identified, Emergency Drugs available, Suction available and Patient being monitored Patient Re-evaluated:Patient Re-evaluated prior to inductionOxygen Delivery Method: Circle System Utilized Preoxygenation: Pre-oxygenation with 100% oxygen Intubation Type: IV induction Ventilation: Mask ventilation without difficulty LMA: LMA inserted LMA Size: 4.0 Number of attempts: 1 Airway Equipment and Method: Bite block Placement Confirmation: positive ETCO2 and breath sounds checked- equal and bilateral Tube secured with: Tape Dental Injury: Teeth and Oropharynx as per pre-operative assessment

## 2014-11-21 NOTE — Op Note (Signed)
11/21/2014  12:39 PM  PATIENT:  Michaela Hardy  59 y.o. female  PRE-OPERATIVE DIAGNOSIS:  Left Breast Sclerosing Lesion  POST-OPERATIVE DIAGNOSIS:  Left Breast Sclerosing Lesion  PROCEDURE:  Procedure(s): LEFT BREAST LUMPECTOMY WITH RADIOACTIVE SEED LOCALIZATION (Left)  SURGEON:  Surgeon(s) and Role:    * Jovita Kussmaul, MD - Primary  PHYSICIAN ASSISTANT:   ASSISTANTS: none   ANESTHESIA:   general  EBL:  Total I/O In: 1000 [I.V.:1000] Out: -   BLOOD ADMINISTERED:none  DRAINS: none   LOCAL MEDICATIONS USED:  MARCAINE     SPECIMEN:  Source of Specimen:  left breast tissue  DISPOSITION OF SPECIMEN:  PATHOLOGY  COUNTS:  YES  TOURNIQUET:  * No tourniquets in log *  DICTATION: .Dragon Dictation  After informed consent was obtained the patient was brought to the operating room and placed in the supine position on the operating room table. After adequate induction of general anesthesia the patient's left breast was prepped with ChloraPrep, allowed to dry, and draped in usual sterile manner. Previously an I-125 seed was placed in the upper outer quadrant of the left breast to mark an area of complex sclerosing lesion. The neoprobe said to I-125 was used to identify the area of radioactivity. A transversely oriented curvilinear incision was made overlying the area of radioactivity with a 15 blade knife. This incision was carried through the skin and subcutaneous tissue sharply with electrocautery until the breast tissue was entered. While checking the area of radioactivity frequently with the neoprobe a circular portion of breast tissue was excised sharply around the radioactive seed. Once the specimen was removed it was oriented with the appropriate paint colors. A specimen radiograph was obtained that showed the clip and seed to be in the center of the specimen. There was no residual I-125 radioactivity in the breast. The specimen was then sent to pathology for further evaluation.  The wound was irrigated with copious amounts of saline and infiltrated with Quarter percent Marcaine. The deep layer of the wound was closed with layers of interrupted 3-0 Vicryl stitch. The skin was then closed with interrupted 4-0 Monocryl subcuticular stitches. Dermabond dressings were applied. The patient tolerated the procedure well. At the end of the case all needle sponge and instrument counts were correct. The patient was then awakened and taken to recovery in stable condition.  PLAN OF CARE: Discharge to home after PACU  PATIENT DISPOSITION:  PACU - hemodynamically stable.   Delay start of Pharmacological VTE agent (>24hrs) due to surgical blood loss or risk of bleeding: not applicable

## 2014-11-21 NOTE — Anesthesia Postprocedure Evaluation (Signed)
  Anesthesia Post-op Note  Patient: Michaela Hardy  Procedure(s) Performed: Procedure(s): LEFT BREAST LUMPECTOMY WITH RADIOACTIVE SEED LOCALIZATION (Left)  Patient Location: PACU  Anesthesia Type: General   Level of Consciousness: awake, alert  and oriented  Airway and Oxygen Therapy: Patient Spontanous Breathing  Post-op Pain: mild  Post-op Assessment: Post-op Vital signs reviewed  Post-op Vital Signs: Reviewed  Last Vitals:  Filed Vitals:   11/21/14 1413  BP: 164/78  Pulse: 68  Temp: 36.5 C  Resp:     Complications: No apparent anesthesia complications

## 2014-11-21 NOTE — Transfer of Care (Signed)
Immediate Anesthesia Transfer of Care Note  Patient: Michaela Hardy  Procedure(s) Performed: Procedure(s): LEFT BREAST LUMPECTOMY WITH RADIOACTIVE SEED LOCALIZATION (Left)  Patient Location: PACU  Anesthesia Type:General  Level of Consciousness: awake and alert   Airway & Oxygen Therapy: Patient Spontanous Breathing and Patient connected to face mask oxygen  Post-op Assessment: Report given to RN and Post -op Vital signs reviewed and stable  Post vital signs: Reviewed and stable  Last Vitals:  Filed Vitals:   11/21/14 1117  BP: 156/72  Pulse: 70  Temp: 36.8 C  Resp: 18    Complications: No apparent anesthesia complications

## 2014-11-21 NOTE — Discharge Instructions (Signed)

## 2014-11-21 NOTE — Interval H&P Note (Signed)
History and Physical Interval Note:  11/21/2014 3:95 AM  Michaela Hardy  has presented today for surgery, with the diagnosis of Left Breast Sclerosing Lesion  The various methods of treatment have been discussed with the patient and family. After consideration of risks, benefits and other options for treatment, the patient has consented to  Procedure(s): LEFT BREAST LUMPECTOMY WITH RADIOACTIVE SEED LOCALIZATION (Left) as a surgical intervention .  The patient's history has been reviewed, patient examined, no change in status, stable for surgery.  I have reviewed the patient's chart and labs.  Questions were answered to the patient's satisfaction.     TOTH III,Tristyn Pharris S

## 2014-11-21 NOTE — Anesthesia Preprocedure Evaluation (Signed)
Anesthesia Evaluation  Patient identified by MRN, date of birth, ID band Patient awake    Reviewed: Allergy & Precautions, NPO status , Patient's Chart, lab work & pertinent test results  History of Anesthesia Complications (+) PONV  Airway Mallampati: I  TM Distance: >3 FB Neck ROM: Full    Dental  (+) Teeth Intact, Dental Advisory Given   Pulmonary former smoker,  breath sounds clear to auscultation        Cardiovascular hypertension, Pt. on medications Rhythm:Regular Rate:Normal     Neuro/Psych    GI/Hepatic GERD-  Medicated,  Endo/Other  Morbid obesity  Renal/GU      Musculoskeletal   Abdominal   Peds  Hematology   Anesthesia Other Findings   Reproductive/Obstetrics                             Anesthesia Physical Anesthesia Plan  ASA: II  Anesthesia Plan: General   Post-op Pain Management:    Induction: Intravenous  Airway Management Planned: LMA  Additional Equipment:   Intra-op Plan:   Post-operative Plan: Extubation in OR  Informed Consent: I have reviewed the patients History and Physical, chart, labs and discussed the procedure including the risks, benefits and alternatives for the proposed anesthesia with the patient or authorized representative who has indicated his/her understanding and acceptance.   Dental advisory given  Plan Discussed with: CRNA, Anesthesiologist and Surgeon  Anesthesia Plan Comments:         Anesthesia Quick Evaluation

## 2014-11-22 ENCOUNTER — Encounter (HOSPITAL_BASED_OUTPATIENT_CLINIC_OR_DEPARTMENT_OTHER): Payer: Self-pay | Admitting: General Surgery

## 2015-01-10 ENCOUNTER — Other Ambulatory Visit: Payer: Self-pay | Admitting: Family Medicine

## 2015-01-10 DIAGNOSIS — F32A Depression, unspecified: Secondary | ICD-10-CM

## 2015-01-10 DIAGNOSIS — F329 Major depressive disorder, single episode, unspecified: Secondary | ICD-10-CM

## 2015-01-14 ENCOUNTER — Encounter: Payer: Self-pay | Admitting: Family Medicine

## 2015-01-14 ENCOUNTER — Ambulatory Visit (INDEPENDENT_AMBULATORY_CARE_PROVIDER_SITE_OTHER): Payer: BLUE CROSS/BLUE SHIELD | Admitting: Family Medicine

## 2015-01-14 VITALS — BP 116/74 | HR 72 | Temp 98.5°F | Resp 16 | Ht 67.0 in | Wt 210.0 lb

## 2015-01-14 DIAGNOSIS — J309 Allergic rhinitis, unspecified: Secondary | ICD-10-CM

## 2015-01-14 DIAGNOSIS — H6980 Other specified disorders of Eustachian tube, unspecified ear: Secondary | ICD-10-CM

## 2015-01-14 DIAGNOSIS — J3489 Other specified disorders of nose and nasal sinuses: Secondary | ICD-10-CM | POA: Diagnosis not present

## 2015-01-14 DIAGNOSIS — H6990 Unspecified Eustachian tube disorder, unspecified ear: Secondary | ICD-10-CM | POA: Insufficient documentation

## 2015-01-14 MED ORDER — FLUTICASONE PROPIONATE 50 MCG/ACT NA SUSP: 2.0000 | Freq: Every day | NASAL | Status: DC

## 2015-01-14 MED ORDER — LORATADINE 10 MG PO TABS: 10.0000 mg | ORAL_TABLET | Freq: Every day | ORAL | Status: DC

## 2015-01-14 NOTE — Progress Notes (Signed)
Subjective:    Patient ID: Michaela Hardy, female    DOB: [birth year], 59 y.o.   MRN: [id number]  Sinusitis This is a new problem. The current episode started in the past 7 days. The problem has been gradually worsening since onset. There has been no fever. Associated symptoms include congestion, ear pain, headaches and sinus pressure. Pertinent negatives include no chills, coughing, diaphoresis, hoarse voice, neck pain, shortness of breath, sneezing, sore throat or swollen glands. Past treatments include oral decongestants. The treatment provided no relief.   Has had trouble with this in the past. Especially change of season.  Not on any allergy medication. Head spinning when leans back.   Patient Active Problem List   Diagnosis Date Noted  . Atypical migraine 08/10/2014  . Routine general medical examination at a health care facility 08/10/2014  . Clinical depression 08/10/2014  . Acid reflux 08/10/2014  . Helicobacter pylori gastrointestinal tract infection 08/10/2014  . Hypercholesteremia 08/10/2014  . BP (high blood pressure) 08/10/2014  . Cannot sleep 08/10/2014  . Cirrhosis, primary biliary 08/10/2014   Social History   Social History  . Marital Status: Married    Spouse Name: N/A  . Number of Children: N/A  . Years of Education: N/A   Occupational History  . Not on file.   Social History Main Topics  . Smoking status: Former Smoker -- 1.50 packs/day for 40 years    Types: Cigarettes    Quit date: 05/10/2004  . Smokeless tobacco: Never Used  . Alcohol Use: 3.0 oz/week    5 Glasses of wine per week     Comment: social  . Drug Use: No  . Sexual Activity: Not Currently   Other Topics Concern  . Not on file   Social History Narrative   No Known Allergies Previous Medications   CALCIUM CARBONATE (OS-CAL) 600 MG TABS TABLET    Take 1 tablet by mouth 2 (two) times daily.   CITALOPRAM (CELEXA) 20 MG TABLET    TAKE 1 TABLET BY MOUTH EVERY DAY   LOSARTAN-HYDROCHLOROTHIAZIDE (HYZAAR) 50-12.5 MG PER TABLET    Take 1 tablet by mouth daily.   MULTIPLE VITAMIN TABLET    Take 1 tablet by mouth daily.   OMEPRAZOLE (PRILOSEC) 20 MG CAPSULE    Take 1 capsule by mouth daily.   SIMVASTATIN (ZOCOR) 10 MG TABLET    Take 1 tablet by mouth daily.   URSODIOL (ACTIGALL) 300 MG CAPSULE    Take 2 capsules by mouth 2 (two) times daily.       Review of Systems  Constitutional: Positive for fatigue. Negative for fever, chills, diaphoresis, activity change, appetite change and unexpected weight change.  HENT: Positive for congestion, ear pain and sinus pressure. Negative for dental problem, drooling, ear discharge, facial swelling, hearing loss, hoarse voice, mouth sores, nosebleeds, postnasal drip, rhinorrhea, sneezing, sore throat, tinnitus, trouble swallowing and voice change.   Eyes: Positive for discharge (watery). Negative for photophobia, pain, redness, itching and visual disturbance.  Respiratory: Negative for apnea, cough, choking, chest tightness, shortness of breath, wheezing and stridor.   Cardiovascular: Negative for chest pain, palpitations and leg swelling.  Gastrointestinal: Positive for nausea. Negative for vomiting, abdominal pain, diarrhea, constipation, blood in stool, abdominal distention, anal bleeding and rectal pain.  Musculoskeletal: Negative for neck pain and neck stiffness.  Neurological: Positive for dizziness and headaches. Negative for facial asymmetry and light-headedness.       Objective:   Physical Exam  Constitutional: She is oriented  to person, place, and time. She appears well-developed and well-nourished.  HENT:  Head: Normocephalic and atraumatic.  Right Ear: External ear normal.  Left Ear: External ear normal.  Nose: Mucosal edema present.  Mouth/Throat: Oropharynx is clear and moist.  Eyes: Conjunctivae and EOM are normal. Pupils are equal, round, and reactive to light.  Neck: Normal range of motion. Neck  supple.  Cardiovascular: Normal rate and regular rhythm.   Pulmonary/Chest: Effort normal and breath sounds normal.  Neurological: She is alert and oriented to person, place, and time.  Psychiatric: She has a normal mood and affect. Her behavior is normal. Judgment and thought content normal.   BP 116/74 mmHg  Pulse 72  Temp(Src) 98.5 F (36.9 C) (Oral)  Resp 16  Ht 5\' 7"  (1.702 m)  Wt 210 lb (95.255 kg)  BMI 32.88 kg/m2      Assessment & Plan:  1. Sinus pain Suspect related to allergies.   2. Allergic sinusitis Will treat for allergies and further plan pending this treatment.  - fluticasone (FLONASE) 50 MCG/ACT nasal spray; Place 2 sprays into both nostrils daily.  Dispense: 16 g; Refill: 6 - loratadine (CLARITIN) 10 MG tablet; Take 1 tablet (10 mg total) by mouth daily.  Dispense: 30 tablet; Refill: 0  3. Eustachian tube dysfunction, unspecified laterality Suspect related to above. Call if worsens or does not improve.     Patient was seen and examined by Jerrell Belfast, MD, and note scribed, in part,  by Ashley Royalty, [place].   I have reviewed the document for accuracy and completeness and I agree with above. Jerrell Belfast, MD

## 2015-01-14 NOTE — Telephone Encounter (Signed)
Last OV 10/2013 

## 2015-01-24 ENCOUNTER — Telehealth: Payer: Self-pay | Admitting: Family Medicine

## 2015-01-24 DIAGNOSIS — J018 Other acute sinusitis: Secondary | ICD-10-CM

## 2015-01-24 DIAGNOSIS — J019 Acute sinusitis, unspecified: Secondary | ICD-10-CM | POA: Insufficient documentation

## 2015-01-24 MED ORDER — AMOXICILLIN-POT CLAVULANATE 875-125 MG PO TABS: 1.0000 | ORAL_TABLET | Freq: Two times a day (BID) | ORAL | Status: DC

## 2015-01-24 NOTE — Telephone Encounter (Signed)
Sent in rx.  Thanks.

## 2015-01-24 NOTE — Telephone Encounter (Signed)
Pt reports she is still having a lot of sinus pressure; dizziness, etc.  She says she is not running a fever, coughing, wheezing.  She was wondering if it was time for an antibiotic?  Thanks,   -Mickel Baas

## 2015-01-24 NOTE — Telephone Encounter (Signed)
Left message advising pt.   Thanks,   -laura  

## 2015-01-24 NOTE — Telephone Encounter (Signed)
Pt stated that she has been taking the meds that she was put on at her last OV on 01/14/15 but she isn't feeling any better. Symptoms are the same. Pt wanted to know if she should try something else. [facility] [address] Marina Gravel TNP

## 2015-02-09 ENCOUNTER — Other Ambulatory Visit: Payer: Self-pay | Admitting: Family Medicine

## 2015-02-09 DIAGNOSIS — J309 Allergic rhinitis, unspecified: Secondary | ICD-10-CM

## 2015-04-09 ENCOUNTER — Ambulatory Visit (INDEPENDENT_AMBULATORY_CARE_PROVIDER_SITE_OTHER): Payer: BLUE CROSS/BLUE SHIELD | Admitting: Physician Assistant

## 2015-04-09 ENCOUNTER — Encounter: Payer: Self-pay | Admitting: Physician Assistant

## 2015-04-09 VITALS — BP 134/70 | HR 86 | Temp 98.4°F | Resp 16 | Wt 209.6 lb

## 2015-04-09 DIAGNOSIS — M62830 Muscle spasm of back: Secondary | ICD-10-CM

## 2015-04-09 MED ORDER — BACLOFEN 10 MG PO TABS: 5.0000 mg | ORAL_TABLET | Freq: Three times a day (TID) | ORAL | Status: DC

## 2015-04-09 MED ORDER — MELOXICAM 15 MG PO TABS: 15.0000 mg | ORAL_TABLET | Freq: Every day | ORAL | Status: DC

## 2015-04-09 NOTE — Progress Notes (Signed)
Patient: [name]    DOB: 19-Aug-1955   59 y.o.   MRN: [id number] Visit Date: 04/09/2015  Today's Provider: Mar Daring, PA-C   Chief Complaint  Patient presents with  . Leg Pain   Subjective:    Leg Pain  The incident occurred more than 1 week ago. There was no injury mechanism. The pain is present in the left leg. The quality of the pain is described as cramping and aching (patient also has aching pain in her lower left side of back). The pain is at a severity of 8/10. The pain is moderate. The pain has been fluctuating since onset. Associated symptoms include an inability to bear weight. The symptoms are aggravated by movement and weight bearing. She has tried acetaminophen (Ibuprofen) for the symptoms. The treatment provided no relief.  Symptoms started approximately 2 weeks ago. She states that she was working out with a friend of hers and they started a new exercise DVD. She states that she notice the pain the next day. She started taking ibuprofen without relief. She states that the pain radiates from her left buttocks around her hip up into the left groin region.     No Known Allergies Previous Medications   AMOXICILLIN-CLAVULANATE (AUGMENTIN) 875-125 MG PER TABLET    Take 1 tablet by mouth 2 (two) times daily.   CALCIUM CARBONATE (OS-CAL) 600 MG TABS TABLET    Take 1 tablet by mouth 2 (two) times daily.   CITALOPRAM (CELEXA) 20 MG TABLET    TAKE 1 TABLET BY MOUTH EVERY DAY   FLUTICASONE (FLONASE) 50 MCG/ACT NASAL SPRAY    Place 2 sprays into both nostrils daily.   LOSARTAN-HYDROCHLOROTHIAZIDE (HYZAAR) 50-12.5 MG PER TABLET    Take 1 tablet by mouth daily.   MULTIPLE VITAMIN TABLET    Take 1 tablet by mouth daily.   OMEPRAZOLE (PRILOSEC) 20 MG CAPSULE    Take 1 capsule by mouth daily.   SIMVASTATIN (ZOCOR) 10 MG TABLET    Take 1 tablet by mouth daily.   URSODIOL (ACTIGALL) 300 MG CAPSULE    Take 2 capsules by mouth 2 (two) times daily.    Review  of Systems  Constitutional: Negative.   Respiratory: Negative.   Cardiovascular: Negative.   Gastrointestinal: Negative.   Musculoskeletal: Positive for back pain (Lower back pain. Left side) and arthralgias (left hip).  Neurological: Negative.   All other systems reviewed and are negative.   Social History  Substance Use Topics  . Smoking status: Former Smoker -- 1.50 packs/day for 40 years    Types: Cigarettes    Quit date: 05/10/2004  . Smokeless tobacco: Never Used  . Alcohol Use: 3.0 oz/week    5 Glasses of wine per week     Comment: social   Objective:   BP 134/70 mmHg  Pulse 86  Temp(Src) 98.4 F (36.9 C) (Oral)  Resp 16  Wt 209 lb 9.6 oz (95.074 kg)  Physical Exam  Constitutional: She appears well-developed and well-nourished. No distress.  Cardiovascular: Normal rate, regular rhythm and normal heart sounds.  Exam reveals no gallop and no friction rub.   No murmur heard. Pulmonary/Chest: Effort normal and breath sounds normal. No respiratory distress. She has no wheezes. She has no rales.  Musculoskeletal:       Right hip: Normal.       Left hip: She exhibits normal range of motion, normal strength, no tenderness and no bony tenderness.  Right knee: Normal.       Lumbar back: Normal.  Spasm noted through the left gluteal muscle. No pain or tenderness with bilateral hip range of motion. No weakness noted. Negative straight leg raise.  Skin: She is not diaphoretic.  Vitals reviewed.       Assessment & Plan:     1. Muscle spasm of back Spasm noted through the left gluteal muscle. Since she has not had much response with ibuprofen I will prescribe meloxicam as below. She is to take 1 tablet daily for 2 weeks and then as needed. I will also give baclofen as below for the muscle spasm. I advised her to take baclofen at night initially to see how much drowsiness it causes. If she does not have much drowsiness she may take during the day as needed. I also advised  her to apply moist heat and/or do Epsom salt soaks for 15-20 minutes. I also gave her information on back stretches and exercises that may help alleviate the pain. She is to call the office if symptoms fail to improve or worsen. May require prednisone for inflammation versus imaging if no improvement. - meloxicam (MOBIC) 15 MG tablet; Take 1 tablet (15 mg total) by mouth daily.  Dispense: 30 tablet; Refill: 0 - baclofen (LIORESAL) 10 MG tablet; Take 0.5 tablets (5 mg total) by mouth 3 (three) times daily.  Dispense: 30 each; Refill: 0       Mar Daring, PA-C  [facility]

## 2015-04-09 NOTE — Patient Instructions (Signed)

## 2015-04-24 ENCOUNTER — Other Ambulatory Visit: Payer: Self-pay | Admitting: Nurse Practitioner

## 2015-04-24 DIAGNOSIS — K743 Primary biliary cirrhosis: Secondary | ICD-10-CM

## 2015-05-07 ENCOUNTER — Ambulatory Visit (INDEPENDENT_AMBULATORY_CARE_PROVIDER_SITE_OTHER): Payer: BLUE CROSS/BLUE SHIELD | Admitting: Family Medicine

## 2015-05-07 ENCOUNTER — Encounter: Payer: Self-pay | Admitting: Family Medicine

## 2015-05-07 VITALS — BP 138/78 | HR 92 | Temp 97.5°F | Resp 16 | Wt 207.0 lb

## 2015-05-07 DIAGNOSIS — I10 Essential (primary) hypertension: Secondary | ICD-10-CM | POA: Diagnosis not present

## 2015-05-07 DIAGNOSIS — J0100 Acute maxillary sinusitis, unspecified: Secondary | ICD-10-CM

## 2015-05-07 DIAGNOSIS — J019 Acute sinusitis, unspecified: Secondary | ICD-10-CM | POA: Insufficient documentation

## 2015-05-07 MED ORDER — AMOXICILLIN-POT CLAVULANATE 875-125 MG PO TABS: 1.0000 | ORAL_TABLET | Freq: Two times a day (BID) | ORAL | Status: DC

## 2015-05-07 NOTE — Progress Notes (Signed)
Patient ID: Michaela Hardy, female   DOB: 20-Mar-1956, 59 y.o.   MRN: [id number]       Patient: Michaela Hardy Female    DOB: [birth year]   59 y.o.   MRN: [id number] Visit Date: 05/07/2015  Today's Provider: Margarita Rana, MD   Chief Complaint  Patient presents with  . URI   Subjective:    HPI Pt reports that about 10 days ago she started having nasal congestion, sinus pain and pressure. She also has dizziness when she leans her head backwards and her ears are popping. She has tried OTC medication, with pseudaphed and it works but makes her jittery, also  flonase, nasal rinse with mild relief. She is not coughing, no fevers, or chills. Just not getting better.  Has been too long. Has had previously and does end up needing an antibiotic. This feels similar. Did not respond to allergy treatment last time. Was in the summer last time. Taking all the medication listed without difficulty.      No Known Allergies Previous Medications   BACLOFEN (LIORESAL) 10 MG TABLET    Take 0.5 tablets (5 mg total) by mouth 3 (three) times daily.   CALCIUM CARBONATE (OS-CAL) 600 MG TABS TABLET    Take 1 tablet by mouth 2 (two) times daily.   CITALOPRAM (CELEXA) 20 MG TABLET    TAKE 1 TABLET BY MOUTH EVERY DAY   FLUTICASONE (FLONASE) 50 MCG/ACT NASAL SPRAY    Place 2 sprays into both nostrils daily.   LOSARTAN-HYDROCHLOROTHIAZIDE (HYZAAR) 50-12.5 MG PER TABLET    Take 1 tablet by mouth daily.   MULTIPLE VITAMIN TABLET    Take 1 tablet by mouth daily.   OMEPRAZOLE (PRILOSEC) 20 MG CAPSULE    Take 1 capsule by mouth daily.   SIMVASTATIN (ZOCOR) 10 MG TABLET    Take 1 tablet by mouth daily.   URSODIOL (ACTIGALL) 300 MG CAPSULE    Take 2 capsules by mouth 2 (two) times daily.    Review of Systems  Constitutional: Positive for fatigue. Negative for activity change and appetite change.  HENT: Positive for congestion, rhinorrhea and sinus pressure.   Eyes: Negative.   Respiratory: Negative.  Negative for  cough, chest tightness and shortness of breath.   Cardiovascular: Negative for chest pain.  Gastrointestinal: Negative.   Endocrine: Negative.   Genitourinary: Negative.   Musculoskeletal: Negative.   Skin: Negative.   Allergic/Immunologic: Negative.   Neurological: Negative.   Hematological: Negative.   Psychiatric/Behavioral: Negative.     Social History  Substance Use Topics  . Smoking status: Former Smoker -- 1.50 packs/day for 40 years    Types: Cigarettes    Quit date: 05/10/2004  . Smokeless tobacco: Never Used  . Alcohol Use: 3.0 oz/week    5 Glasses of wine per week     Comment: social   Objective:   BP 138/78 mmHg  Pulse 92  Temp(Src) 97.5 F (36.4 C) (Oral)  Resp 16  Wt 207 lb (93.895 kg)  SpO2 97%  Physical Exam  Constitutional: She is oriented to person, place, and time. She appears well-developed and well-nourished.  HENT:  Head: Normocephalic and atraumatic.  Right Ear: Tympanic membrane and external ear normal.  Left Ear: Tympanic membrane and external ear normal.  Nose: Mucosal edema and rhinorrhea present. Right sinus exhibits maxillary sinus tenderness. Left sinus exhibits maxillary sinus tenderness.  Mouth/Throat: Uvula is midline and oropharynx is clear and moist.  Eyes: Conjunctivae and EOM are normal. Pupils  are equal, round, and reactive to light.  Neck: Normal range of motion. Neck supple.  Cardiovascular: Normal rate and regular rhythm.   Pulmonary/Chest: Effort normal and breath sounds normal. She has no wheezes. She has no rales.  Neurological: She is alert and oriented to person, place, and time.      Assessment & Plan:     1. Acute maxillary sinusitis, recurrence not specified New problem.  Worsening.  Start antibiotic. Patient instructed to call back if condition worsens or does not improve.    - amoxicillin-clavulanate (AUGMENTIN) 875-125 MG tablet; Take 1 tablet by mouth 2 (two) times daily.  Dispense: 20 tablet; Refill: 0  2.  Essential hypertension No more pseudaphed secondary to BP.       Margarita Rana, MD  [facility]

## 2015-05-15 ENCOUNTER — Ambulatory Visit
Admission: RE | Admit: 2015-05-15 | Discharge: 2015-05-15 | Disposition: A | Discharge status: Home / Self Care | Payer: BLUE CROSS/BLUE SHIELD | Source: Ambulatory Visit | Attending: Nurse Practitioner | Admitting: Nurse Practitioner

## 2015-05-15 DIAGNOSIS — Z9049 Acquired absence of other specified parts of digestive tract: Secondary | ICD-10-CM | POA: Insufficient documentation

## 2015-05-15 DIAGNOSIS — K743 Primary biliary cirrhosis: Secondary | ICD-10-CM | POA: Diagnosis not present

## 2015-05-15 MED ORDER — GADOBENATE DIMEGLUMINE 529 MG/ML IV SOLN
20.0000 mL | Freq: Once | INTRAVENOUS | Status: AC | PRN
  Administered 2015-05-15: 19 mL via INTRAVENOUS

## 2015-05-22 ENCOUNTER — Telehealth: Payer: Self-pay | Admitting: Family Medicine

## 2015-05-22 DIAGNOSIS — J0100 Acute maxillary sinusitis, unspecified: Secondary | ICD-10-CM

## 2015-05-22 MED ORDER — AMOXICILLIN-POT CLAVULANATE 875-125 MG PO TABS: 1.0000 | ORAL_TABLET | Freq: Two times a day (BID) | ORAL | Status: DC

## 2015-05-22 NOTE — Telephone Encounter (Signed)
Pt called in saying she just finished her antibiotic for the sinus infection and has started back with symptoms.   Can she get another round on antibiotic or does she need to come in.  Her call back is [phone]  Thanks teri

## 2015-05-27 ENCOUNTER — Encounter: Payer: Self-pay | Admitting: Family Medicine

## 2015-06-09 ENCOUNTER — Other Ambulatory Visit: Payer: Self-pay | Admitting: Family Medicine

## 2015-06-09 DIAGNOSIS — I10 Essential (primary) hypertension: Secondary | ICD-10-CM

## 2015-06-12 ENCOUNTER — Inpatient Hospital Stay: Payer: BLUE CROSS/BLUE SHIELD | Attending: Internal Medicine | Admitting: Internal Medicine

## 2015-06-12 VITALS — BP 169/88 | HR 74 | Temp 98.4°F | Resp 18 | Ht 67.0 in | Wt 209.0 lb

## 2015-06-12 DIAGNOSIS — D472 Monoclonal gammopathy: Secondary | ICD-10-CM

## 2015-06-12 DIAGNOSIS — E785 Hyperlipidemia, unspecified: Secondary | ICD-10-CM

## 2015-06-12 DIAGNOSIS — I10 Essential (primary) hypertension: Secondary | ICD-10-CM

## 2015-06-12 DIAGNOSIS — F418 Other specified anxiety disorders: Secondary | ICD-10-CM

## 2015-06-12 DIAGNOSIS — Z79899 Other long term (current) drug therapy: Secondary | ICD-10-CM | POA: Diagnosis not present

## 2015-06-12 DIAGNOSIS — Z87891 Personal history of nicotine dependence: Secondary | ICD-10-CM

## 2015-06-12 DIAGNOSIS — K219 Gastro-esophageal reflux disease without esophagitis: Secondary | ICD-10-CM | POA: Diagnosis not present

## 2015-06-12 DIAGNOSIS — D890 Polyclonal hypergammaglobulinemia: Secondary | ICD-10-CM

## 2015-06-12 NOTE — Progress Notes (Signed)
[place] NOTE  Patient Care Team: Margarita Rana, MD as PCP - General (Family Medicine)  CHIEF COMPLAINTS/PURPOSE OF CONSULTATION: Increased IgM  HISTORY OF PRESENTING ILLNESS:  Michaela Hardy 60 y.o.  female  With a history of primary biliary cholangitis was recently evaluated by gastroenterology with protein electrophoresis and immunofixation.  IgM was elevated at 332 She has been referred to [place] for further evaluation.   patient denies any back pain;  Denies any unusual fatigue or  Nausea vomiting. Denies any tingling or numbness. Denies any unusual weight loss or night sweats. No frequent infections.  Denies any bruising or easy bleeding.   ROS: A complete 10 point review of system is done which is negative except mentioned above in history of present illness  MEDICAL HISTORY:  Past Medical History  Diagnosis Date  . Hyperlipidemia   . Hypertension   . Depression   . Anxiety   . GERD (gastroesophageal reflux disease)   . PONV (postoperative nausea and vomiting)   . Hepatitis     Primary bilary cholangitis    SURGICAL HISTORY: Past Surgical History  Procedure Laterality Date  . Appendectomy  1976  . Cholecystectomy  06/29/2007  . Tubal ligation  1987  . Liver biopsy  2008  . Breast biopsy Left 10/03/2014    Dr. Autumn Patty  . Breast lumpectomy with radioactive seed localization Left 11/21/2014    Procedure: LEFT BREAST LUMPECTOMY WITH RADIOACTIVE SEED LOCALIZATION;  Surgeon: Autumn Messing III, MD;  Location: [facility];  Service: General;  Laterality: Left;    SOCIAL HISTORY: Social History   Social History  . Marital Status: Married    Spouse Name: N/A  . Number of Children: N/A  . Years of Education: N/A   Occupational History  . Not on file.   Social History Main Topics  . Smoking status: Former Smoker -- 1.50 packs/day for 40 years    Types: Cigarettes    Quit date: 05/10/2004  . Smokeless tobacco: Never Used  . Alcohol  Use: 3.0 oz/week    5 Glasses of wine per week     Comment: social  . Drug Use: No  . Sexual Activity: Not Currently   Other Topics Concern  . Not on file   Social History Narrative    FAMILY HISTORY: Family History  Problem Relation Age of Onset  . Hypertension Mother   . Pancreatitis Mother   . Thyroid disease Mother   . Hypertension Father   . Heart attack Father   . Cerebrovascular Accident Father   . Coronary artery disease Father   . Glaucoma Father   . Cancer Father     lung  . Parkinson's disease Maternal Grandmother     ALLERGIES:  has No Known Allergies.  MEDICATIONS:  Current Outpatient Prescriptions  Medication Sig Dispense Refill  . amoxicillin-clavulanate (AUGMENTIN) 875-125 MG tablet Take 1 tablet by mouth 2 (two) times daily. 20 tablet 0  . baclofen (LIORESAL) 10 MG tablet Take 0.5 tablets (5 mg total) by mouth 3 (three) times daily. 30 each 0  . calcium carbonate (OS-CAL) 600 MG TABS tablet Take 1 tablet by mouth 2 (two) times daily.    . citalopram (CELEXA) 20 MG tablet TAKE 1 TABLET BY MOUTH EVERY DAY 90 tablet 1  . fluticasone (FLONASE) 50 MCG/ACT nasal spray Place 2 sprays into both nostrils daily. 16 g 6  . losartan-hydrochlorothiazide (HYZAAR) 50-12.5 MG tablet TAKE 1 TABLET DAILY 90 tablet 3  .  Multiple Vitamin tablet Take 1 tablet by mouth daily.    Marland Kitchen omeprazole (PRILOSEC) 20 MG capsule Take 1 capsule by mouth daily.    . simvastatin (ZOCOR) 10 MG tablet Take 1 tablet by mouth daily.    . ursodiol (ACTIGALL) 300 MG capsule Take 2 capsules by mouth 2 (two) times daily.     No current facility-administered medications for this visit.      Marland Kitchen  PHYSICAL EXAMINATION:   Filed Vitals:   06/12/15 1111  BP: 169/88  Pulse: 74  Temp: 98.4 F (36.9 C)  Resp: 18   Filed Weights   06/12/15 1111  Weight: 208 lb 15.9 oz (94.8 kg)    GENERAL: Well-nourished well-developed; Alert, no distress and comfortable.  Alone. EYES: no pallor or  icterus OROPHARYNX: no thrush or ulceration; good dentition  NECK: supple, no masses felt LYMPH:  no palpable lymphadenopathy in the cervical, axillary or inguinal regions LUNGS: clear to auscultation and  No wheeze or crackles HEART/CVS: regular rate & rhythm and no murmurs; No lower extremity edema ABDOMEN: abdomen soft, non-tender and normal bowel sounds Musculoskeletal:no cyanosis of digits and no clubbing  PSYCH: alert & oriented x 3 with fluent speech NEURO: no focal motor/sensory deficits SKIN:  no rashes or significant lesions  LABORATORY DATA:  I have reviewed the data as listed Lab Results  Component Value Date   WBC 4.9 10/15/2014   HGB 13.4 11/21/2014   HCT 38.4 10/15/2014   MCV 91 10/15/2014   PLT 373 10/15/2014    Recent Labs  10/15/14 0822 11/18/14 1500  NA 140 136  K 4.8 3.6  CL 97 98*  CO2 27 28  GLUCOSE 117* 104*  BUN 11 7  CREATININE 0.72 0.68  CALCIUM 9.5 9.2  GFRNONAA 93 >60  GFRAA 107 >60  PROT 7.3  --   ALBUMIN 4.4  --   AST 24  --   ALT 27  --   ALKPHOS 106  --   BILITOT 0.3  --     Mr Abdomen W Wo Contrast  05/15/2015  CLINICAL DATA:  Primary biliary cholangitis, fatty liver, elevated labs EXAM: MRI ABDOMEN WITHOUT AND WITH CONTRAST TECHNIQUE: Multiplanar multisequence MR imaging of the abdomen was performed both before and after the administration of intravenous contrast. CONTRAST:  63mL MULTIHANCE GADOBENATE DIMEGLUMINE 529 MG/ML IV SOLN COMPARISON:  Abdominal ultrasound dated 07/19/2012 FINDINGS: Lower chest:  Lung bases are clear. Hepatobiliary: Liver is within normal limits. No morphologic findings of cirrhosis. No suspicious/enhancing lesions. Status post cholecystectomy. No intrahepatic or extrahepatic ductal dilatation. Common duct measures 9 mm and smoothly tapers at the ampulla. No choledocholithiasis seen. Pancreas: Within normal limits. Spleen: Within normal limits. Adrenals/Urinary Tract: Adrenal glands are within normal limits.  Kidneys are within normal limits.  No hydronephrosis. Stomach/Bowel: Stomach is within normal limits. Visualized bowel is unremarkable. Vascular/Lymphatic: No evidence abdominal aortic aneurysm. No suspicious abdominal lymphadenopathy. Other: No abdominal ascites. Musculoskeletal: No focal osseous lesions. IMPRESSION: No morphologic findings of cirrhosis.  No hepatic steatosis. Status post cholecystectomy. No intrahepatic or extrahepatic ductal dilatation. Electronically Signed   By: Julian Hy M.D.   On: 05/15/2015 18:28    ASSESSMENT & PLAN:  #  Polyclonal gammopathy/ elevated IgM-  Immunofixation showed-  No M protein.  Normal  Rest of the  Immunoglobulins.  Reviewed the CBC- within normal limits;  Kidney function- normal.   I reviewed with the patient  At length that  Polyclonal  Gammopathy could be seen in  inflammatory states/ autoimmune  Diseases.  As there is absence of monoclonal protein-  I'm not concerned about   Plasma cell dyscrasia.  I would not recommend any further workup at this time  Unless clinically  Needed in future.   Patient  Does not need any follow-up in the clinic at this time.  She'll continue follow up with PCP/ GI physicians.   Thank you Ms. Jerelene Redden  For all of me to participate in the care of your patient.  Please not hesitate to contact me if any questions or concerns.      Cammie Sickle, MD 06/12/2015 11:16 AM

## 2015-07-20 ENCOUNTER — Other Ambulatory Visit: Payer: Self-pay | Admitting: Family Medicine

## 2015-07-20 DIAGNOSIS — F32A Depression, unspecified: Secondary | ICD-10-CM

## 2015-07-20 DIAGNOSIS — F329 Major depressive disorder, single episode, unspecified: Secondary | ICD-10-CM

## 2015-07-20 DIAGNOSIS — E7800 Pure hypercholesterolemia, unspecified: Secondary | ICD-10-CM

## 2015-07-30 ENCOUNTER — Other Ambulatory Visit: Payer: Self-pay | Admitting: Physician Assistant

## 2015-08-31 ENCOUNTER — Other Ambulatory Visit: Payer: Self-pay | Admitting: Physician Assistant

## 2015-09-10 ENCOUNTER — Encounter: Payer: Self-pay | Admitting: Family Medicine

## 2015-09-10 ENCOUNTER — Ambulatory Visit (INDEPENDENT_AMBULATORY_CARE_PROVIDER_SITE_OTHER): Payer: BLUE CROSS/BLUE SHIELD | Admitting: Family Medicine

## 2015-09-10 VITALS — BP 136/78 | HR 88 | Temp 98.3°F | Resp 20 | Wt 208.0 lb

## 2015-09-10 DIAGNOSIS — M7632 Iliotibial band syndrome, left leg: Secondary | ICD-10-CM

## 2015-09-10 MED ORDER — MELOXICAM 15 MG PO TABS: 15.0000 mg | ORAL_TABLET | Freq: Every day | ORAL | Status: DC

## 2015-09-10 NOTE — Progress Notes (Signed)
Subjective:    Patient ID: Michaela Hardy, female    DOB: 1955-08-12, 60 y.o.   MRN: [id number]  Leg Pain  Incident onset: x 3 months. Was seen 04/09/2015 by Tawanna Sat for leg pain and was prescribed Meloxicam and Baclofen for back muscle spasm. Pt reports the leg pain is not in the same location and is a different pain now. Back pain has improved. Injury mechanism: exercising. The pain is present in the left leg. The quality of the pain is described as aching. The pain is at a severity of 7/10. The pain is moderate. The pain has been fluctuating since onset. Associated symptoms include an inability to bear weight and muscle weakness. Pertinent negatives include no loss of motion, loss of sensation, numbness or tingling. Exacerbated by: going from sitting to standing position. Treatments tried: Baclofen (for back pain), Meloxicam. The treatment provided mild relief.      Review of Systems  Neurological: Negative for tingling and numbness.   BP 136/78 mmHg  Pulse 88  Temp(Src) 98.3 F (36.8 C) (Oral)  Resp 20  Wt 208 lb (94.348 kg)   Patient Active Problem List   Diagnosis Date Noted  . Sinusitis, acute 05/07/2015  . Allergic sinusitis 01/14/2015  . Eustachian tube dysfunction 01/14/2015  . Primary biliary cholangitis ([place]) 11/15/2014  . Atypical migraine 08/10/2014  . Routine general medical examination at a health care facility 08/10/2014  . Clinical depression 08/10/2014  . Acid reflux 08/10/2014  . Helicobacter pylori gastrointestinal tract infection 08/10/2014  . Hypercholesteremia 08/10/2014  . BP (high blood pressure) 08/10/2014  . Cannot sleep 08/10/2014  . Cirrhosis, primary biliary ([place]) 08/10/2014  . Gastro-esophageal reflux disease without esophagitis 08/10/2014   Past Medical History  Diagnosis Date  . Hyperlipidemia   . Hypertension   . Depression   . Anxiety   . GERD (gastroesophageal reflux disease)   . PONV (postoperative nausea and vomiting)   . Hepatitis      Primary bilary cholangitis   Current Outpatient Prescriptions on File Prior to Visit  Medication Sig  . calcium carbonate (OS-CAL) 600 MG TABS tablet Take 1 tablet by mouth 2 (two) times daily.  . citalopram (CELEXA) 20 MG tablet TAKE 1 TABLET BY MOUTH EVERY DAY  . losartan-hydrochlorothiazide (HYZAAR) 50-12.5 MG tablet TAKE 1 TABLET DAILY  . Multiple Vitamin tablet Take 1 tablet by mouth daily.  Marland Kitchen omeprazole (PRILOSEC) 20 MG capsule Take 1 capsule by mouth daily.  . simvastatin (ZOCOR) 10 MG tablet TAKE 1 TABLET BY MOUTH DAILY  . ursodiol (ACTIGALL) 300 MG capsule Take 2 capsules by mouth 2 (two) times daily.  . baclofen (LIORESAL) 10 MG tablet Take 0.5 tablets (5 mg total) by mouth 3 (three) times daily. (Patient not taking: Reported on 09/10/2015)  . fluticasone (FLONASE) 50 MCG/ACT nasal spray Place 2 sprays into both nostrils daily. (Patient not taking: Reported on 09/10/2015)   No current facility-administered medications on file prior to visit.   No Known Allergies Past Surgical History  Procedure Laterality Date  . Appendectomy  1976  . Cholecystectomy  06/29/2007  . Tubal ligation  1987  . Liver biopsy  2008  . Breast biopsy Left 10/03/2014    Dr. Autumn Patty  . Breast lumpectomy with radioactive seed localization Left 11/21/2014    Procedure: LEFT BREAST LUMPECTOMY WITH RADIOACTIVE SEED LOCALIZATION;  Surgeon: Autumn Messing III, MD;  Location: [facility];  Service: General;  Laterality: Left;   Social History   Social History  .  Marital Status: Married    Spouse Name: N/A  . Number of Children: N/A  . Years of Education: N/A   Occupational History  . Not on file.   Social History Main Topics  . Smoking status: Former Smoker -- 1.50 packs/day for 40 years    Types: Cigarettes    Quit date: 05/10/2004  . Smokeless tobacco: Never Used  . Alcohol Use: Yes     Comment: social  . Drug Use: No  . Sexual Activity: Not Currently   Other Topics Concern  . Not on  file   Social History Narrative   Family History  Problem Relation Age of Onset  . Hypertension Mother   . Pancreatitis Mother   . Thyroid disease Mother   . Hypertension Father   . Heart attack Father   . Cerebrovascular Accident Father   . Coronary artery disease Father   . Glaucoma Father   . Cancer Father     lung  . Parkinson's disease Maternal Grandmother       Objective:   Physical Exam  Constitutional: She appears well-developed and well-nourished.  Musculoskeletal:  No back tenderness with palpation. Negative leg lifts bilaterally. No pain with internal and external lateral movements. Normal reflexes bilaterally.  Psychiatric: She has a normal mood and affect. Her behavior is normal.  BP 136/78 mmHg  Pulse 88  Temp(Src) 98.3 F (36.8 C) (Oral)  Resp 20  Wt 208 lb (94.348 kg)     Assessment & Plan:  1. IT band syndrome, left New problem.  Advised pt to purchase rolling stick from a sporting goods store. Continue Meloxicam as below. Printed off rehabilitation exercises for pt to perform. FU if worsening or if there is no improvement. - meloxicam (MOBIC) 15 MG tablet; Take 1 tablet (15 mg total) by mouth daily.  Dispense: 30 tablet; Refill: 1    Patient seen and examined by Jerrell Belfast, MD, and note scribed by Renaldo Fiddler, CMA.  I have reviewed the document for accuracy and completeness and I agree with above. Jerrell Belfast, MD   Margarita Rana, MD

## 2015-09-10 NOTE — Patient Instructions (Signed)
Iliotibial Band Syndrome With Rehab The iliotibial (IT) band is a tendon that connects the hip muscles to the shinbone (tibia) and to one of the bones of the pelvis (ileum). The IT band passes by the knee and is often irritated by the outer portion of the knee (lateral femoral condyle). A fluid filled sac (bursa) exists between the tendon and the bone, to cushion and reduce friction. Overuse of the tendon may cause excessive friction, which results in IT band syndrome. This condition involves inflammation of the bursa (bursitis) and/or inflammation of the IT band (tendinitis). SYMPTOMS   Pain, tenderness, swelling, warmth, or redness over the IT band, at the outer knee (above the joint).  Pain that travels up or down the thigh or leg.  Initially, pain at the beginning of an exercise, that decreases once warmed up. Eventually, pain throughout the activity, getting worse as the activity continues. May cause the athlete to stop in the middle of training or competing.  Pain that gets worse when running down hills or stairs, on banked tracks, or next to the curb on the street.  Pain that increases when the foot of the affected leg hits the ground.  Possibly, a crackling sound (crepitation) when the tendon or bursa is moved or touched. CAUSES  IT band syndrome is caused by irritation of the IT band and the underlying bursa. This eventually results in inflammation and pain. IT band syndrome is an overuse injury.  RISK INCREASES WITH:  Sports with repetitive knee-bending activities (distance running, cycling).  Incorrect training techniques, including sudden changes in the intensity, frequency, or duration of training.  Not enough rest between workouts.  Poor strength and flexibility, especially a tight IT band.  Failure to warm up properly before activity.  Bow legs.  Arthritis of the knee. PREVENTION   Warm up and stretch properly before activity.  Allow for adequate recovery between  workouts.  Maintain physical fitness:  Strength, flexibility, and endurance.  Cardiovascular fitness.  Learn and use proper training technique, including reducing running mileage, shortening stride, and avoiding running on hills and banked surfaces.  Wear arch supports (orthotics), if you have flat feet. PROGNOSIS  If treated properly, IT band syndrome usually goes away within 6 weeks of treatment. RELATED COMPLICATIONS   Longer healing time, if not properly treated, or if not given enough time to heal.  Recurring inflammation of the tendon and bursa, that may result in a chronic condition.  Recurring symptoms, if activity is resumed too soon, with overuse, with a direct blow, or with poor training technique.  Inability to complete training or competition. TREATMENT  Treatment first involves the use of ice and medicine, to reduce pain and inflammation. The use of strengthening and stretching exercises may help reduce pain with activity. These exercises may be performed at home or with a therapist. For individuals with flat feet, an arch support (orthotic) may be helpful. Some individuals find that wearing a knee sleeve or compression bandage around the knee during workouts provides some relief. Certain training techniques, such as adjusting stride length, avoiding running on hills or stairs, changing the direction you run on a circular or banked track, or changing the side of the road you run on, if you run next to the curb, may help decrease symptoms of IT band syndrome. Cyclists may need to change the seat height or foot position on their bicycles. An injection of cortisone into the bursa may be recommended. Surgery to remove the inflamed bursa and/or part   of the IT band is only considered after at least 6 months of non-surgical treatment.  MEDICATION   If pain medicine is needed, nonsteroidal anti-inflammatory medicines (aspirin and ibuprofen), or other minor pain relievers  (acetaminophen), are often advised.  Do not take pain medicine for 7 days before surgery.  Prescription pain relievers may be given, if your caregiver thinks they are needed. Use only as directed and only as much as you need.  Corticosteroid injections may be given by your caregiver. These injections should be reserved for the most serious cases, because they may only be given a certain number of times. HEAT AND COLD  Cold treatment (icing) should be applied for 10 to 15 minutes every 2 to 3 hours for inflammation and pain, and immediately after activity that aggravates your symptoms. Use ice packs or an ice massage.  Heat treatment may be used before performing stretching and strengthening activities prescribed by your caregiver, physical therapist, or athletic trainer. Use a heat pack or a warm water soak. SEEK MEDICAL CARE IF:   Symptoms get worse or do not improve in 2 to 4 weeks, despite treatment.  New, unexplained symptoms develop. (Drugs used in treatment may produce side effects.) EXERCISES  RANGE OF MOTION (ROM) AND STRETCHING EXERCISES - Iliotibial Band Syndrome These exercises may help you when beginning to rehabilitate your injury. Your symptoms may go away with or without further involvement from your physician, physical therapist or athletic trainer. While completing these exercises, remember:   Restoring tissue flexibility helps normal motion to return to the joints. This allows healthier, less painful movement and activity.  An effective stretch should be held for at least 30 seconds.  A stretch should never be painful. You should only feel a gentle lengthening or release in the stretched tissue. STRETCH - Quadriceps, Prone   Lie on your stomach on a firm surface, such as a bed or padded floor.  Bend your right / left knee and grasp your ankle. If you are unable to reach your ankle or pant leg, use a belt around your foot to lengthen your reach.  Gently pull your heel  toward your buttocks. Your knee should not slide out to the side. You should feel a stretch in the front of your thigh and knee.  Hold this position for __________ seconds. Repeat __________ times. Complete this stretch __________ times per day.  STRETCH - Iliotibial Band  On the floor or bed, lie on your side, so your right / left leg is on top. Bend your knee and grab your ankle.  Slowly bring your knee back so that your thigh is in line with your trunk. Keep your heel at your buttocks and gently arch your back, so your head, shoulders and hips line up.  Slowly lower your leg so that your knee approaches the floor or bed, until you feel a gentle stretch on the outside of your right / left thigh. If you do not feel a stretch and your knee will not fall farther, place the heel of your opposite foot on top of your knee, and pull your thigh down farther.  Hold this stretch for __________ seconds. Repeat __________ times. Complete this stretch __________ times per day. STRENGTHENING EXERCISES - Iliotibial Band Syndrome Improving the flexibility of the IT band will best relieve your discomfort due to IT band syndrome. Strengthening exercises, however, can help improve both muscle endurance and joint mechanics, reducing the factors that can contribute to this condition. Your physician, physical   therapist or athletic trainer may provide you with exercises that train specific muscle groups that are especially weak. The following exercises target muscles that are often weak in people who have IT band syndrome. STRENGTH - Hip Abductors, Straight Leg Raises  Be aware of your form throughout the entire exercise, so that you exercise the correct muscles. Poor form means that you are not strengthening the correct muscles.  Lie on your side, so that your head, shoulders, knee and hip line up. You may bend your lower knee to help maintain your balance. Your right / left leg should be on top.  Roll your hips  slightly forward, so that your hips are stacked directly over each other and your right / left knee is facing forward.  Lift your top leg up 4-6 inches, leading with your heel. Be sure that your foot does not drift forward and that your knee does not roll toward the ceiling.  Hold this position for __________ seconds. You should feel the muscles in your outer hip lifting (you may not notice this until your leg begins to tire).  Slowly lower your leg to the starting position. Allow the muscles to fully relax before beginning the next repetition. Repeat __________ times. Complete this exercise __________ times per day.  STRENGTH - Quad/VMO, Isometric  Sit in a chair with your right / left knee slightly bent. With your fingertips, feel the VMO muscle (just above the inside of your knee). The VMO is important in controlling the position of your kneecap.  Keeping your fingertips on this muscle. Without actually moving your leg, attempt to drive your knee down, as if straightening your leg. You should feel your VMO tense. If you have a difficult time, you may wish to try the same exercise on your healthy knee first.  Tense this muscle as hard as you can, without increasing any knee pain.  Hold for __________ seconds. Relax the muscles slowly and completely between each repetition. Repeat __________ times. Complete this exercise __________ times per day.    This information is not intended to replace advice given to you by your health care provider. Make sure you discuss any questions you have with your health care provider.   Document Released: 04/26/2005 Document Revised: 05/17/2014 Document Reviewed: 08/08/2008 Elsevier Interactive Patient Education 2016 [employer] Inc[employer]  

## 2015-09-25 ENCOUNTER — Other Ambulatory Visit: Payer: Self-pay

## 2015-09-25 DIAGNOSIS — Z1231 Encounter for screening mammogram for malignant neoplasm of breast: Secondary | ICD-10-CM

## 2015-10-01 DIAGNOSIS — Z1389 Encounter for screening for other disorder: Secondary | ICD-10-CM | POA: Diagnosis not present

## 2015-10-01 DIAGNOSIS — D649 Anemia, unspecified: Secondary | ICD-10-CM | POA: Diagnosis not present

## 2015-10-01 DIAGNOSIS — Z124 Encounter for screening for malignant neoplasm of cervix: Secondary | ICD-10-CM | POA: Diagnosis not present

## 2015-10-01 DIAGNOSIS — Z130 Encounter for screening for diseases of the blood and blood-forming organs and certain disorders involving the immune mechanism: Secondary | ICD-10-CM | POA: Diagnosis not present

## 2015-10-01 DIAGNOSIS — Z01419 Encounter for gynecological examination (general) (routine) without abnormal findings: Secondary | ICD-10-CM | POA: Diagnosis not present

## 2015-10-01 DIAGNOSIS — Z1151 Encounter for screening for human papillomavirus (HPV): Secondary | ICD-10-CM | POA: Diagnosis not present

## 2015-10-20 ENCOUNTER — Encounter: Payer: Self-pay | Admitting: Family Medicine

## 2015-10-20 ENCOUNTER — Ambulatory Visit (INDEPENDENT_AMBULATORY_CARE_PROVIDER_SITE_OTHER): Payer: BLUE CROSS/BLUE SHIELD | Admitting: Family Medicine

## 2015-10-20 VITALS — BP 156/86 | HR 96 | Temp 98.3°F | Resp 20 | Ht 67.0 in | Wt 208.0 lb

## 2015-10-20 DIAGNOSIS — E7800 Pure hypercholesterolemia, unspecified: Secondary | ICD-10-CM

## 2015-10-20 DIAGNOSIS — Z1159 Encounter for screening for other viral diseases: Secondary | ICD-10-CM | POA: Diagnosis not present

## 2015-10-20 DIAGNOSIS — R7309 Other abnormal glucose: Secondary | ICD-10-CM

## 2015-10-20 DIAGNOSIS — Z0000 Encounter for general adult medical examination without abnormal findings: Secondary | ICD-10-CM

## 2015-10-20 DIAGNOSIS — I10 Essential (primary) hypertension: Secondary | ICD-10-CM

## 2015-10-20 DIAGNOSIS — R739 Hyperglycemia, unspecified: Secondary | ICD-10-CM | POA: Insufficient documentation

## 2015-10-20 LAB — POCT URINALYSIS DIPSTICK
Bilirubin, UA: NEGATIVE
Blood, UA: NEGATIVE
Glucose, UA: NEGATIVE
Ketones, UA: NEGATIVE
LEUKOCYTES UA: NEGATIVE
NITRITE UA: NEGATIVE
PH UA: 5
PROTEIN UA: NEGATIVE
Spec Grav, UA: 1.025
UROBILINOGEN UA: 0.2

## 2015-10-20 MED ORDER — LOSARTAN POTASSIUM-HCTZ 100-25 MG PO TABS: 1.0000 | ORAL_TABLET | Freq: Every day | ORAL | Status: DC

## 2015-10-20 NOTE — Progress Notes (Signed)
Patient ID: [name], female   DOB: 1955-10-11, 60 y.o.   MRN: [id number]       Patient: Michaela Hardy, Female    DOB: 1955-06-14, 60 y.o.   MRN: [id number] Visit Date: 10/20/2015  Today's Provider: Margarita Rana, MD   Chief Complaint  Patient presents with  . Annual Exam   Subjective:    Annual physical exam Michaela Hardy is a 61 y.o. female who presents today for health maintenance and complete physical. She feels well. She reports exercising 3 days a week. She reports she is sleeping well.  10/31/13 CPE 09/2015  Pap-GYN-per pt normal HPV-neg 10/27/15 appt for mammogram 05/31/07 Colonoscopy-polyps; int hemorrhoids, recheck in 10 yrs Dr. Vira Agar 04/16/14 BMD-osteopenia, recheck in 2-3 yrs 09/26/07 EKG -----------------------------------------------------------------   Hypertension, follow-up:  BP Readings from Last 3 Encounters:  10/20/15 156/86  09/10/15 136/78  06/12/15 169/88    She was last seen for hypertension 6 months ago.  BP at that visit was 136/78. Management changes since that visit include no changes. She reports excellent compliance with treatment. She is not having side effects.  She is exercising. She is adherent to low salt diet.   Outside blood pressures are elevated. She is experiencing none.  Patient denies chest pain.   Cardiovascular risk factors include none.  Use of agents associated with hypertension: none.     Weight trend: stable Wt Readings from Last 3 Encounters:  10/20/15 208 lb (94.348 kg)  09/10/15 208 lb (94.348 kg)  06/12/15 208 lb 15.9 oz (94.8 kg)   Current diet: in general, a "healthy" diet   ------------------------------------------------------------------------   Review of Systems  Constitutional: Negative.   HENT: Negative.   Eyes: Positive for visual disturbance.  Respiratory: Negative.   Cardiovascular: Negative.   Gastrointestinal: Negative.   Endocrine: Negative.   Genitourinary: Negative.     Musculoskeletal: Negative.   Skin: Negative.   Allergic/Immunologic: Negative.   Neurological: Negative.   Hematological: Negative.   Psychiatric/Behavioral: Negative.     Social History      She  reports that she quit smoking about 11 years ago. Her smoking use included Cigarettes. She has a 60 pack-year smoking history. She has never used smokeless tobacco. She reports that she drinks alcohol. She reports that she does not use illicit drugs.       Social History   Social History  . Marital Status: Married    Spouse Name: N/A  . Number of Children: N/A  . Years of Education: N/A   Social History Main Topics  . Smoking status: Former Smoker -- 1.50 packs/day for 40 years    Types: Cigarettes    Quit date: 05/10/2004  . Smokeless tobacco: Never Used  . Alcohol Use: Yes     Comment: social  . Drug Use: No  . Sexual Activity: Not Currently   Other Topics Concern  . None   Social History Narrative    Past Medical History  Diagnosis Date  . Hyperlipidemia   . Hypertension   . Depression   . Anxiety   . GERD (gastroesophageal reflux disease)   . PONV (postoperative nausea and vomiting)   . Hepatitis     Primary bilary cholangitis     Patient Active Problem List   Diagnosis Date Noted  . Sinusitis, acute 05/07/2015  . Allergic sinusitis 01/14/2015  . Eustachian tube dysfunction 01/14/2015  . Primary biliary cholangitis ([place]) 11/15/2014  . Atypical migraine 08/10/2014  . Routine general medical examination  at a health care facility 08/10/2014  . Clinical depression 08/10/2014  . Acid reflux 08/10/2014  . Helicobacter pylori gastrointestinal tract infection 08/10/2014  . Hypercholesteremia 08/10/2014  . BP (high blood pressure) 08/10/2014  . Cannot sleep 08/10/2014  . Cirrhosis, primary biliary ([place]) 08/10/2014  . Gastro-esophageal reflux disease without esophagitis 08/10/2014    Past Surgical History  Procedure Laterality Date  . Appendectomy  1976  .  Cholecystectomy  06/29/2007  . Tubal ligation  1987  . Liver biopsy  2008  . Breast biopsy Left 10/03/2014    Dr. Autumn Patty  . Breast lumpectomy with radioactive seed localization Left 11/21/2014    Procedure: LEFT BREAST LUMPECTOMY WITH RADIOACTIVE SEED LOCALIZATION;  Surgeon: Autumn Messing III, MD;  Location: [facility];  Service: General;  Laterality: Left;    Family History        Family Status  Relation Status Death Age  . Mother Alive   . Father Deceased 42    Stroke  . Brother Alive         Her family history includes Cancer in her father; Cerebrovascular Accident in her father; Coronary artery disease in her father; Glaucoma in her father; Heart attack in her father; Hypertension in her father and mother; Pancreatitis in her mother; Parkinson's disease in her maternal grandmother; Thyroid disease in her mother.    No Known Allergies  Current Meds  Medication Sig  . calcium carbonate (OS-CAL) 600 MG TABS tablet Take 1 tablet by mouth daily with breakfast.   . citalopram (CELEXA) 20 MG tablet TAKE 1 TABLET BY MOUTH EVERY DAY  . losartan-hydrochlorothiazide (HYZAAR) 50-12.5 MG tablet TAKE 1 TABLET DAILY  . meloxicam (MOBIC) 15 MG tablet Take 1 tablet (15 mg total) by mouth daily.  . Multiple Vitamin tablet Take 1 tablet by mouth daily.  Marland Kitchen omeprazole (PRILOSEC) 20 MG capsule Take 1 capsule by mouth daily.  . simvastatin (ZOCOR) 10 MG tablet TAKE 1 TABLET BY MOUTH DAILY  . ursodiol (ACTIGALL) 300 MG capsule Take 2 capsules by mouth 2 (two) times daily.    Patient Care Team: Margarita Rana, MD as PCP - General (Family Medicine)     Objective:   Vitals: BP 156/86 mmHg  Pulse 96  Temp(Src) 98.3 F (36.8 C) (Oral)  Resp 20  Ht 5\' 7"  (1.702 m)  Wt 208 lb (94.348 kg)  BMI 32.57 kg/m2   Physical Exam  Constitutional: She is oriented to person, place, and time. She appears well-developed and well-nourished.  HENT:  Head: Normocephalic and atraumatic.  Right Ear:  Tympanic membrane, external ear and ear canal normal.  Left Ear: Tympanic membrane, external ear and ear canal normal.  Nose: Nose normal.  Mouth/Throat: Uvula is midline, oropharynx is clear and moist and mucous membranes are normal.  Eyes: Conjunctivae, EOM and lids are normal. Pupils are equal, round, and reactive to light.  Neck: Trachea normal and normal range of motion. Neck supple. Carotid bruit is not present. No thyroid mass and no thyromegaly present.  Cardiovascular: Normal rate, regular rhythm and normal heart sounds.   Pulmonary/Chest: Effort normal and breath sounds normal.  Abdominal: Soft. Normal appearance and bowel sounds are normal. There is no hepatosplenomegaly. There is no tenderness.  Musculoskeletal: Normal range of motion.  Lymphadenopathy:    She has no cervical adenopathy.    She has no axillary adenopathy.  Neurological: She is alert and oriented to person, place, and time. She has normal strength. No cranial nerve deficit.  Skin: Skin is warm, dry and intact.  Psychiatric: She has a normal mood and affect. Her speech is normal and behavior is normal. Judgment and thought content normal. Cognition and memory are normal.     Depression Screen PHQ 2/9 Scores 10/20/2015 10/09/2014  PHQ - 2 Score 0 0      Assessment & Plan:     Routine Health Maintenance and Physical Exam  Exercise Activities and Dietary recommendations Goals    . Exercise 150 minutes per week (moderate activity)       Immunization History  Administered Date(s) Administered  . Hepatitis A 11/27/2008, 05/30/2009  . Hepatitis B 11/27/2008, 12/26/2008, 05/30/2009  . Td 12/03/1998  . Tdap 06/29/2012   --------------------------------------------------------------------  1. Annual physical exam Stable. Patient advised to continue eating healthy and exercise daily. - POCT urinalysis dipstick  2. Essential hypertension Not at goal. Worsening. Patient advised to increase her  losartan-hydrochlorothiazide 100-25 mg as below. Patient advised to follow-up in 3 months with Sonia Baller, pt advised if blood pressure is not improving may need to add amlodipine.  - CBC with Differential/Platelet - Comprehensive metabolic panel - losartan-hydrochlorothiazide (HYZAAR) 100-25 MG tablet; Take 1 tablet by mouth daily.  Dispense: 90 tablet; Refill: 3  3. Hypercholesteremia - Lipid Panel With LDL/HDL Ratio - TSH  4. Elevated blood sugar - Hemoglobin A1c  5. Need for hepatitis C screening test - Hepatitis C antibody   Patient seen and examined by Dr. Jerrell Belfast, and note scribed by Philbert Riser. Dimas, CMA.  I have reviewed the document for accuracy and completeness and I agree with above. - Jerrell Belfast, MD   Margarita Rana, MD  [facility]

## 2015-10-21 DIAGNOSIS — I10 Essential (primary) hypertension: Secondary | ICD-10-CM | POA: Diagnosis not present

## 2015-10-21 DIAGNOSIS — E7800 Pure hypercholesterolemia, unspecified: Secondary | ICD-10-CM | POA: Diagnosis not present

## 2015-10-21 DIAGNOSIS — Z1159 Encounter for screening for other viral diseases: Secondary | ICD-10-CM | POA: Diagnosis not present

## 2015-10-21 DIAGNOSIS — R7309 Other abnormal glucose: Secondary | ICD-10-CM | POA: Diagnosis not present

## 2015-10-22 LAB — COMPREHENSIVE METABOLIC PANEL
A/G RATIO: 1.9 (ref 1.2–2.2)
ALBUMIN: 4.5 g/dL (ref 3.5–5.5)
ALT: 31 IU/L (ref 0–32)
AST: 22 IU/L (ref 0–40)
Alkaline Phosphatase: 122 IU/L — ABNORMAL HIGH (ref 39–117)
BUN / CREAT RATIO: 29 — AB (ref 9–23)
BUN: 19 mg/dL (ref 6–24)
Bilirubin Total: 0.3 mg/dL (ref 0.0–1.2)
CALCIUM: 9.3 mg/dL (ref 8.7–10.2)
CO2: 24 mmol/L (ref 18–29)
CREATININE: 0.65 mg/dL (ref 0.57–1.00)
Chloride: 100 mmol/L (ref 96–106)
GFR calc Af Amer: 112 mL/min/{1.73_m2} (ref >59)
GFR, EST NON AFRICAN AMERICAN: 97 mL/min/{1.73_m2} (ref >59)
GLOBULIN, TOTAL: 2.4 g/dL (ref 1.5–4.5)
Glucose: 111 mg/dL — ABNORMAL HIGH (ref 65–99)
POTASSIUM: 4.5 mmol/L (ref 3.5–5.2)
SODIUM: 142 mmol/L (ref 134–144)
Total Protein: 6.9 g/dL (ref 6.0–8.5)

## 2015-10-22 LAB — CBC WITH DIFFERENTIAL/PLATELET
BASOS: 1 %
Basophils Absolute: 0 10*3/uL (ref 0.0–0.2)
EOS (ABSOLUTE): 0.1 10*3/uL (ref 0.0–0.4)
EOS: 2 %
HEMATOCRIT: 37.3 % (ref 34.0–46.6)
HEMOGLOBIN: 12.5 g/dL (ref 11.1–15.9)
IMMATURE GRANULOCYTES: 0 %
Immature Grans (Abs): 0 10*3/uL (ref 0.0–0.1)
Lymphocytes Absolute: 1.2 10*3/uL (ref 0.7–3.1)
Lymphs: 21 %
MCH: 30.5 pg (ref 26.6–33.0)
MCHC: 33.5 g/dL (ref 31.5–35.7)
MCV: 91 fL (ref 79–97)
MONOCYTES: 8 %
MONOS ABS: 0.5 10*3/uL (ref 0.1–0.9)
Neutrophils Absolute: 4.1 10*3/uL (ref 1.4–7.0)
Neutrophils: 68 %
Platelets: 357 10*3/uL (ref 150–379)
RBC: 4.1 x10E6/uL (ref 3.77–5.28)
RDW: 14.2 % (ref 12.3–15.4)
WBC: 5.9 10*3/uL (ref 3.4–10.8)

## 2015-10-22 LAB — LIPID PANEL WITH LDL/HDL RATIO
Cholesterol, Total: 204 mg/dL — ABNORMAL HIGH (ref 100–199)
HDL: 54 mg/dL (ref >39)
LDL CALC: 116 mg/dL — AB (ref 0–99)
LDL/HDL RATIO: 2.1 ratio (ref 0.0–3.2)
Triglycerides: 168 mg/dL — ABNORMAL HIGH (ref 0–149)
VLDL CHOLESTEROL CAL: 34 mg/dL (ref 5–40)

## 2015-10-22 LAB — HEPATITIS C ANTIBODY: Hep C Virus Ab: <0.1 s/co ratio (ref 0.0–0.9)

## 2015-10-22 LAB — TSH: TSH: 4.06 u[IU]/mL (ref 0.450–4.500)

## 2015-10-22 LAB — HEMOGLOBIN A1C
Est. average glucose Bld gHb Est-mCnc: 117 mg/dL
Hgb A1c MFr Bld: 5.7 % — ABNORMAL HIGH (ref 4.8–5.6)

## 2015-10-23 ENCOUNTER — Telehealth: Payer: Self-pay

## 2015-10-23 NOTE — Telephone Encounter (Signed)
LMTCB 10/23/2015  Thanks,   -Mickel Baas

## 2015-10-23 NOTE — Telephone Encounter (Signed)
-----   Message from Margarita Rana, MD sent at 10/22/2015  9:37 AM EDT ----- Labs stable. Blood sugar improved.  Make sure to continue medication, eat healthy and exercise and recheck in 6 months.  Thanks.

## 2015-10-24 NOTE — Telephone Encounter (Signed)
Advised pt of lab results. Pt verbally acknowledges understanding. Emily Drozdowski, CMA   

## 2015-10-27 ENCOUNTER — Ambulatory Visit: Payer: BLUE CROSS/BLUE SHIELD

## 2015-11-04 ENCOUNTER — Ambulatory Visit
Admission: RE | Admit: 2015-11-04 | Discharge: 2015-11-04 | Disposition: A | Discharge status: Home / Self Care | Payer: BLUE CROSS/BLUE SHIELD | Source: Ambulatory Visit

## 2015-11-04 DIAGNOSIS — Z1231 Encounter for screening mammogram for malignant neoplasm of breast: Secondary | ICD-10-CM

## 2015-11-06 ENCOUNTER — Other Ambulatory Visit: Payer: Self-pay | Admitting: Family Medicine

## 2015-11-20 ENCOUNTER — Ambulatory Visit: Payer: BLUE CROSS/BLUE SHIELD | Admitting: Family Medicine

## 2015-11-25 DIAGNOSIS — K743 Primary biliary cirrhosis: Secondary | ICD-10-CM | POA: Diagnosis not present

## 2015-11-25 DIAGNOSIS — K760 Fatty (change of) liver, not elsewhere classified: Secondary | ICD-10-CM | POA: Diagnosis not present

## 2015-12-05 ENCOUNTER — Encounter: Payer: Self-pay | Admitting: Physician Assistant

## 2015-12-05 ENCOUNTER — Ambulatory Visit (INDEPENDENT_AMBULATORY_CARE_PROVIDER_SITE_OTHER): Payer: BLUE CROSS/BLUE SHIELD | Admitting: Physician Assistant

## 2015-12-05 VITALS — BP 154/88 | HR 88 | Temp 98.1°F | Resp 16 | Wt 209.0 lb

## 2015-12-05 DIAGNOSIS — J0140 Acute pansinusitis, unspecified: Secondary | ICD-10-CM | POA: Diagnosis not present

## 2015-12-05 MED ORDER — AMOXICILLIN-POT CLAVULANATE 875-125 MG PO TABS: 1.0000 | ORAL_TABLET | Freq: Two times a day (BID) | ORAL | 0 refills | Status: DC

## 2015-12-05 NOTE — Patient Instructions (Signed)

## 2015-12-05 NOTE — Progress Notes (Signed)
Patient: Michaela Hardy Female    DOB: May 09, 1956   59 y.o.   MRN: [id number] Visit Date: 12/05/2015  Today's Provider: Mar Daring, PA-C   Chief Complaint  Patient presents with  . Sinusitis   Subjective:    Sinusitis  This is a new problem. The current episode started 1 to 4 weeks ago. The problem has been gradually worsening since onset. There has been no fever. Associated symptoms include congestion, coughing, ear pain (pressure on her ears), headaches and sinus pressure. Pertinent negatives include no chills, shortness of breath or sore throat. Past treatments include spray decongestants (Sinus Pressure and congestion relief PE). The treatment provided mild relief.      No Known Allergies Current Meds  Medication Sig  . calcium carbonate (OS-CAL) 600 MG TABS tablet Take 1 tablet by mouth daily with breakfast.   . citalopram (CELEXA) 20 MG tablet TAKE 1 TABLET BY MOUTH EVERY DAY  . fluticasone (FLONASE) 50 MCG/ACT nasal spray Place into the nose.  . losartan-hydrochlorothiazide (HYZAAR) 100-25 MG tablet Take 1 tablet by mouth daily.  . meloxicam (MOBIC) 15 MG tablet TAKE 1 TABLET (15 MG TOTAL) BY MOUTH DAILY.  . Multiple Vitamin tablet Take 1 tablet by mouth daily.  Marland Kitchen omeprazole (PRILOSEC) 20 MG capsule Take 1 capsule by mouth daily.  . simvastatin (ZOCOR) 10 MG tablet TAKE 1 TABLET BY MOUTH DAILY  . ursodiol (ACTIGALL) 300 MG capsule Take 2 capsules by mouth 2 (two) times daily.    Review of Systems  Constitutional: Positive for fatigue. Negative for chills and fever.  HENT: Positive for congestion, ear pain (pressure on her ears), postnasal drip, rhinorrhea (in the mornings) and sinus pressure. Negative for facial swelling and sore throat.   Respiratory: Positive for cough. Negative for chest tightness, shortness of breath and wheezing.   Cardiovascular: Negative for chest pain, palpitations and leg swelling.  Gastrointestinal: Negative.   Neurological:  Positive for dizziness and headaches.    Social History  Substance Use Topics  . Smoking status: Former Smoker    Packs/day: 1.50    Years: 40.00    Types: Cigarettes    Quit date: 05/10/2004  . Smokeless tobacco: Never Used  . Alcohol use Yes     Comment: social   Objective:   BP (!) 154/88 (BP Location: Left Arm, Patient Position: Sitting, Cuff Size: Normal) Comment: taking Phenylephrine  Pulse 88   Temp 98.1 F (36.7 C) (Oral)   Resp 16   Wt 209 lb (94.8 kg)   BMI 32.73 kg/m   Physical Exam  Constitutional: She appears well-developed and well-nourished. No distress.  HENT:  Head: Normocephalic and atraumatic.  Right Ear: Hearing, external ear and ear canal normal. A middle ear effusion is present.  Left Ear: Hearing, external ear and ear canal normal. A middle ear effusion is present.  Nose: No mucosal edema or rhinorrhea. Right sinus exhibits maxillary sinus tenderness and frontal sinus tenderness. Left sinus exhibits maxillary sinus tenderness and frontal sinus tenderness.  Mouth/Throat: Uvula is midline, oropharynx is clear and moist and mucous membranes are normal. No oropharyngeal exudate, posterior oropharyngeal edema or posterior oropharyngeal erythema.  Neck: Normal range of motion. Neck supple. No tracheal deviation present. No thyromegaly present.  Cardiovascular: Normal rate, regular rhythm and normal heart sounds.  Exam reveals no gallop and no friction rub.   No murmur heard. Pulmonary/Chest: Effort normal and breath sounds normal. No stridor. No respiratory distress. She has no  wheezes. She has no rales.  Lymphadenopathy:    She has no cervical adenopathy.  Skin: She is not diaphoretic.  Vitals reviewed.     Assessment & Plan:     1. Acute pansinusitis, recurrence not specified Worsening symptoms that has not responded to OTC medications. Will treat with augmentin as below. She is to continue flonase. Discussed saline nasal washes. She is to call if  symptoms worsen or fail to improve. - amoxicillin-clavulanate (AUGMENTIN) 875-125 MG tablet; Take 1 tablet by mouth 2 (two) times daily.  Dispense: 20 tablet; Refill: 0       Mar Daring, PA-C  [facility]

## 2016-01-22 ENCOUNTER — Ambulatory Visit (INDEPENDENT_AMBULATORY_CARE_PROVIDER_SITE_OTHER): Payer: BLUE CROSS/BLUE SHIELD | Admitting: Physician Assistant

## 2016-01-22 VITALS — BP 138/76 | HR 84 | Temp 98.7°F | Resp 16 | Wt 208.0 lb

## 2016-01-22 DIAGNOSIS — Z23 Encounter for immunization: Secondary | ICD-10-CM

## 2016-01-22 DIAGNOSIS — I10 Essential (primary) hypertension: Secondary | ICD-10-CM

## 2016-01-22 NOTE — Patient Instructions (Signed)
DASH Eating Plan  DASH stands for "Dietary Approaches to Stop Hypertension." The DASH eating plan is a healthy eating plan that has been shown to reduce high blood pressure (hypertension). Additional health benefits may include reducing the risk of type 2 diabetes mellitus, heart disease, and stroke. The DASH eating plan may also help with weight loss.  WHAT DO I NEED TO KNOW ABOUT THE DASH EATING PLAN?  For the DASH eating plan, you will follow these general guidelines:  · Choose foods with a percent daily value for sodium of less than 5% (as listed on the food label).  · Use salt-free seasonings or herbs instead of table salt or sea salt.  · Check with your health care provider or pharmacist before using salt substitutes.  · Eat lower-sodium products, often labeled as "lower sodium" or "no salt added."  · Eat fresh foods.  · Eat more vegetables, fruits, and low-fat dairy products.  · Choose whole grains. Look for the word "whole" as the first word in the ingredient list.  · Choose fish and skinless chicken or turkey more often than red meat. Limit fish, poultry, and meat to 6 oz (170 g) each day.  · Limit sweets, desserts, sugars, and sugary drinks.  · Choose heart-healthy fats.  · Limit cheese to 1 oz (28 g) per day.  · Eat more home-cooked food and less restaurant, buffet, and fast food.  · Limit fried foods.  · Cook foods using methods other than frying.  · Limit canned vegetables. If you do use them, rinse them well to decrease the sodium.  · When eating at a restaurant, ask that your food be prepared with less salt, or no salt if possible.  WHAT FOODS CAN I EAT?  Seek help from a dietitian for individual calorie needs.  Grains  Whole grain or whole wheat bread. Brown rice. Whole grain or whole wheat pasta. Quinoa, bulgur, and whole grain cereals. Low-sodium cereals. Corn or whole wheat flour tortillas. Whole grain cornbread. Whole grain crackers. Low-sodium crackers.  Vegetables  Fresh or frozen vegetables  (raw, steamed, roasted, or grilled). Low-sodium or reduced-sodium tomato and vegetable juices. Low-sodium or reduced-sodium tomato sauce and paste. Low-sodium or reduced-sodium canned vegetables.   Fruits  All fresh, canned (in natural juice), or frozen fruits.  Meat and Other Protein Products  Ground beef (85% or leaner), grass-fed beef, or beef trimmed of fat. Skinless chicken or turkey. Ground chicken or turkey. Pork trimmed of fat. All fish and seafood. Eggs. Dried beans, peas, or lentils. Unsalted nuts and seeds. Unsalted canned beans.  Dairy  Low-fat dairy products, such as skim or 1% milk, 2% or reduced-fat cheeses, low-fat ricotta or cottage cheese, or plain low-fat yogurt. Low-sodium or reduced-sodium cheeses.  Fats and Oils  Tub margarines without trans fats. Light or reduced-fat mayonnaise and salad dressings (reduced sodium). Avocado. Safflower, olive, or canola oils. Natural peanut or almond butter.  Other  Unsalted popcorn and pretzels.  The items listed above may not be a complete list of recommended foods or beverages. Contact your dietitian for more options.  WHAT FOODS ARE NOT RECOMMENDED?  Grains  White bread. White pasta. White rice. Refined cornbread. Bagels and croissants. Crackers that contain trans fat.  Vegetables  Creamed or fried vegetables. Vegetables in a cheese sauce. Regular canned vegetables. Regular canned tomato sauce and paste. Regular tomato and vegetable juices.  Fruits  Dried fruits. Canned fruit in light or heavy syrup. Fruit juice.  Meat and Other Protein   Products  Fatty cuts of meat. Ribs, chicken wings, bacon, sausage, bologna, salami, chitterlings, fatback, hot dogs, bratwurst, and packaged luncheon meats. Salted nuts and seeds. Canned beans with salt.  Dairy  Whole or 2% milk, cream, half-and-half, and cream cheese. Whole-fat or sweetened yogurt. Full-fat cheeses or blue cheese. Nondairy creamers and whipped toppings. Processed cheese, cheese spreads, or cheese  curds.  Condiments  Onion and garlic salt, seasoned salt, table salt, and sea salt. Canned and packaged gravies. Worcestershire sauce. Tartar sauce. Barbecue sauce. Teriyaki sauce. Soy sauce, including reduced sodium. Steak sauce. Fish sauce. Oyster sauce. Cocktail sauce. Horseradish. Ketchup and mustard. Meat flavorings and tenderizers. Bouillon cubes. Hot sauce. Tabasco sauce. Marinades. Taco seasonings. Relishes.  Fats and Oils  Butter, stick margarine, lard, shortening, ghee, and bacon fat. Coconut, palm kernel, or palm oils. Regular salad dressings.  Other  Pickles and olives. Salted popcorn and pretzels.  The items listed above may not be a complete list of foods and beverages to avoid. Contact your dietitian for more information.  WHERE CAN I FIND MORE INFORMATION?  National Heart, Lung, and Blood Institute: www.nhlbi.nih.gov/health/health-topics/topics/dash/     This information is not intended to replace advice given to you by your health care provider. Make sure you discuss any questions you have with your health care provider.     Document Released: 04/15/2011 Document Revised: 05/17/2014 Document Reviewed: 02/28/2013  Elsevier Interactive Patient Education ©2016 Elsevier Inc.

## 2016-01-22 NOTE — Progress Notes (Signed)
Patient: Michaela Hardy Female    DOB: 06-18-1955   60 y.o.   MRN: [id number] Visit Date: 01/22/2016  Today's Provider: Mar Daring, PA-C   Chief Complaint  Patient presents with  . Hypertension   Subjective:    HPI  Patient is here for 3 months follow up on her b/p after Hyzaar dose was increased by Dr Venia Minks in June. She has not been checking her b/p herself and has not had any side effects.  BP Readings from Last 3 Encounters:  01/22/16 138/76  12/05/15 (!) 154/88  10/20/15 (!) 156/86   She has also been followed by Dawson Bills, NP for primary biliary colangitis. They have been following her labs and liver function had improved at most recent visit in July. She was found to be non-reactive to Hep B. She is requesting a booster today and will get her Hep B titer rechecked with GI. If she remains non-reactive, she most likely will not sero-convert. She has had the 3 part series already in 2010 and 2011.    No Known Allergies   Current Outpatient Prescriptions:  .  calcium carbonate (OS-CAL) 600 MG TABS tablet, Take 1 tablet by mouth daily with breakfast. , Disp: , Rfl:  .  citalopram (CELEXA) 20 MG tablet, TAKE 1 TABLET BY MOUTH EVERY DAY, Disp: 90 tablet, Rfl: 3 .  fluticasone (FLONASE) 50 MCG/ACT nasal spray, Place into the nose., Disp: , Rfl:  .  losartan-hydrochlorothiazide (HYZAAR) 100-25 MG tablet, Take 1 tablet by mouth daily., Disp: 90 tablet, Rfl: 3 .  meloxicam (MOBIC) 15 MG tablet, TAKE 1 TABLET (15 MG TOTAL) BY MOUTH DAILY., Disp: 30 tablet, Rfl: 5 .  Multiple Vitamin tablet, Take 1 tablet by mouth daily., Disp: , Rfl:  .  omeprazole (PRILOSEC) 20 MG capsule, Take 1 capsule by mouth daily., Disp: , Rfl:  .  simvastatin (ZOCOR) 10 MG tablet, TAKE 1 TABLET BY MOUTH DAILY, Disp: 90 tablet, Rfl: 3 .  ursodiol (ACTIGALL) 300 MG capsule, Take 2 capsules by mouth 2 (two) times daily., Disp: , Rfl:   Review of Systems  Constitutional: Negative.     Respiratory: Negative.   Cardiovascular: Negative.   Musculoskeletal: Positive for arthralgias.  Neurological: Negative.     Social History  Substance Use Topics  . Smoking status: Former Smoker    Packs/day: 1.50    Years: 40.00    Types: Cigarettes    Quit date: 05/10/2004  . Smokeless tobacco: Never Used  . Alcohol use Yes     Comment: social   Objective:   BP 138/76   Pulse 84   Temp 98.7 F (37.1 C)   Resp 16   Wt 208 lb (94.3 kg)   BMI 32.58 kg/m   Physical Exam  Constitutional: She appears well-developed and well-nourished. No distress.  Neck: Normal range of motion. Neck supple. No tracheal deviation present. No thyromegaly present.  Cardiovascular: Normal rate, regular rhythm and normal heart sounds.  Exam reveals no gallop and no friction rub.   No murmur heard. Pulmonary/Chest: Effort normal and breath sounds normal. No respiratory distress. She has no wheezes. She has no rales.  Musculoskeletal: She exhibits no edema.  Lymphadenopathy:    She has no cervical adenopathy.  Skin: She is not diaphoretic.  Vitals reviewed.     Assessment & Plan:     1. Essential hypertension BP has improved some with increased dose of Hyzaar. Will continue current dose. She  will have her BP checked in 3 months at GI. If stable I will see her back in June 2018 for next physical. If BP increases she is to call the office and we will consider adding amlodipine.  2. Need for hepatitis B vaccination Hep B booster given due to non-reactive Hep B titer. Has had 3 part Hep B series in 2010-2011.  - Hepatitis B vaccine adult IM       Mar Daring, PA-C  [facility]

## 2016-03-01 ENCOUNTER — Other Ambulatory Visit: Payer: Self-pay | Admitting: Family Medicine

## 2016-03-03 ENCOUNTER — Other Ambulatory Visit: Payer: Self-pay | Admitting: Family Medicine

## 2016-03-03 DIAGNOSIS — J309 Allergic rhinitis, unspecified: Secondary | ICD-10-CM

## 2016-06-18 DIAGNOSIS — K743 Primary biliary cirrhosis: Secondary | ICD-10-CM | POA: Diagnosis not present

## 2016-06-29 ENCOUNTER — Other Ambulatory Visit: Payer: Self-pay

## 2016-06-29 MED ORDER — CITALOPRAM HYDROBROMIDE 20 MG PO TABS: 20.0000 mg | ORAL_TABLET | Freq: Every day | ORAL | 3 refills | Status: DC

## 2016-07-16 ENCOUNTER — Other Ambulatory Visit: Payer: Self-pay

## 2016-07-16 MED ORDER — SIMVASTATIN 10 MG PO TABS: 10.0000 mg | ORAL_TABLET | Freq: Every day | ORAL | 3 refills | Status: DC

## 2016-08-29 ENCOUNTER — Other Ambulatory Visit: Payer: Self-pay | Admitting: Physician Assistant

## 2016-09-22 DIAGNOSIS — K743 Primary biliary cirrhosis: Secondary | ICD-10-CM | POA: Diagnosis not present

## 2016-10-08 ENCOUNTER — Other Ambulatory Visit: Payer: Self-pay | Admitting: Obstetrics and Gynecology

## 2016-10-08 DIAGNOSIS — Z1231 Encounter for screening mammogram for malignant neoplasm of breast: Secondary | ICD-10-CM

## 2016-10-11 ENCOUNTER — Other Ambulatory Visit: Payer: Self-pay | Admitting: Physician Assistant

## 2016-10-11 DIAGNOSIS — I10 Essential (primary) hypertension: Secondary | ICD-10-CM

## 2016-10-11 MED ORDER — LOSARTAN POTASSIUM-HCTZ 100-25 MG PO TABS: 1.0000 | ORAL_TABLET | Freq: Every day | ORAL | 1 refills | Status: DC

## 2016-10-11 NOTE — Telephone Encounter (Signed)
[facility] faxed a refill request on the following medications:  losartan-hydrochlorothiazide (HYZAAR) 100-25 MG tablet.  Take 1 tablet by mouth daily.  90 day supply.  [facility]

## 2016-10-11 NOTE — Telephone Encounter (Signed)
Last ov  01/22/16 Last filled 10/20/15 Please review. Thank you. sd

## 2016-11-04 ENCOUNTER — Ambulatory Visit: Payer: BLUE CROSS/BLUE SHIELD

## 2016-11-23 ENCOUNTER — Ambulatory Visit
Admission: RE | Admit: 2016-11-23 | Discharge: 2016-11-23 | Disposition: A | Discharge status: Home / Self Care | Payer: BLUE CROSS/BLUE SHIELD | Source: Ambulatory Visit | Attending: Obstetrics and Gynecology | Admitting: Obstetrics and Gynecology

## 2016-11-23 DIAGNOSIS — Z1231 Encounter for screening mammogram for malignant neoplasm of breast: Secondary | ICD-10-CM

## 2016-11-29 DIAGNOSIS — Z1389 Encounter for screening for other disorder: Secondary | ICD-10-CM | POA: Diagnosis not present

## 2016-11-29 DIAGNOSIS — Z6832 Body mass index (BMI) 32.0-32.9, adult: Secondary | ICD-10-CM | POA: Diagnosis not present

## 2016-11-29 DIAGNOSIS — Z124 Encounter for screening for malignant neoplasm of cervix: Secondary | ICD-10-CM | POA: Diagnosis not present

## 2016-11-29 DIAGNOSIS — Z01419 Encounter for gynecological examination (general) (routine) without abnormal findings: Secondary | ICD-10-CM | POA: Diagnosis not present

## 2016-11-29 DIAGNOSIS — Z130 Encounter for screening for diseases of the blood and blood-forming organs and certain disorders involving the immune mechanism: Secondary | ICD-10-CM | POA: Diagnosis not present

## 2016-11-30 ENCOUNTER — Ambulatory Visit (INDEPENDENT_AMBULATORY_CARE_PROVIDER_SITE_OTHER): Payer: BLUE CROSS/BLUE SHIELD | Admitting: Physician Assistant

## 2016-11-30 ENCOUNTER — Encounter: Payer: Self-pay | Admitting: Physician Assistant

## 2016-11-30 VITALS — BP 140/80 | HR 76 | Temp 98.3°F | Resp 16 | Ht 67.0 in | Wt 209.6 lb

## 2016-11-30 DIAGNOSIS — Z1211 Encounter for screening for malignant neoplasm of colon: Secondary | ICD-10-CM | POA: Diagnosis not present

## 2016-11-30 DIAGNOSIS — R739 Hyperglycemia, unspecified: Secondary | ICD-10-CM | POA: Diagnosis not present

## 2016-11-30 DIAGNOSIS — Z0000 Encounter for general adult medical examination without abnormal findings: Secondary | ICD-10-CM

## 2016-11-30 DIAGNOSIS — I10 Essential (primary) hypertension: Secondary | ICD-10-CM

## 2016-11-30 DIAGNOSIS — E7800 Pure hypercholesterolemia, unspecified: Secondary | ICD-10-CM

## 2016-11-30 DIAGNOSIS — Z6832 Body mass index (BMI) 32.0-32.9, adult: Secondary | ICD-10-CM

## 2016-11-30 DIAGNOSIS — K743 Primary biliary cirrhosis: Secondary | ICD-10-CM | POA: Diagnosis not present

## 2016-11-30 DIAGNOSIS — Z1159 Encounter for screening for other viral diseases: Secondary | ICD-10-CM | POA: Diagnosis not present

## 2016-11-30 DIAGNOSIS — H8111 Benign paroxysmal vertigo, right ear: Secondary | ICD-10-CM | POA: Diagnosis not present

## 2016-11-30 DIAGNOSIS — K760 Fatty (change of) liver, not elsewhere classified: Secondary | ICD-10-CM | POA: Diagnosis not present

## 2016-11-30 NOTE — Patient Instructions (Signed)
Health Maintenance for Postmenopausal Women Menopause is a normal process in which your reproductive ability comes to an end. This process happens gradually over a span of months to years, usually between the ages of 22 and 9. Menopause is complete when you have missed 12 consecutive menstrual periods. It is important to talk with your health care provider about some of the most common conditions that affect postmenopausal women, such as heart disease, cancer, and bone loss (osteoporosis). Adopting a healthy lifestyle and getting preventive care can help to promote your health and wellness. Those actions can also lower your chances of developing some of these common conditions. What should I know about menopause? During menopause, you may experience a number of symptoms, such as:  Moderate-to-severe hot flashes.  Night sweats.  Decrease in sex drive.  Mood swings.  Headaches.  Tiredness.  Irritability.  Memory problems.  Insomnia.  Choosing to treat or not to treat menopausal changes is an individual decision that you make with your health care provider. What should I know about hormone replacement therapy and supplements? Hormone therapy products are effective for treating symptoms that are associated with menopause, such as hot flashes and night sweats. Hormone replacement carries certain risks, especially as you become older. If you are thinking about using estrogen or estrogen with progestin treatments, discuss the benefits and risks with your health care provider. What should I know about heart disease and stroke? Heart disease, heart attack, and stroke become more likely as you age. This may be due, in part, to the hormonal changes that your body experiences during menopause. These can affect how your body processes dietary fats, triglycerides, and cholesterol. Heart attack and stroke are both medical emergencies. There are many things that you can do to help prevent heart disease  and stroke:  Have your blood pressure checked at least every 1-2 years. High blood pressure causes heart disease and increases the risk of stroke.  If you are 53-22 years old, ask your health care provider if you should take aspirin to prevent a heart attack or a stroke.  Do not use any tobacco products, including cigarettes, chewing tobacco, or electronic cigarettes. If you need help quitting, ask your health care provider.  It is important to eat a healthy diet and maintain a healthy weight. ? Be sure to include plenty of vegetables, fruits, low-fat dairy products, and lean protein. ? Avoid eating foods that are high in solid fats, added sugars, or salt (sodium).  Get regular exercise. This is one of the most important things that you can do for your health. ? Try to exercise for at least 150 minutes each week. The type of exercise that you do should increase your heart rate and make you sweat. This is known as moderate-intensity exercise. ? Try to do strengthening exercises at least twice each week. Do these in addition to the moderate-intensity exercise.  Know your numbers.Ask your health care provider to check your cholesterol and your blood glucose. Continue to have your blood tested as directed by your health care provider.  What should I know about cancer screening? There are several types of cancer. Take the following steps to reduce your risk and to catch any cancer development as early as possible. Breast Cancer  Practice breast self-awareness. ? This means understanding how your breasts normally appear and feel. ? It also means doing regular breast self-exams. Let your health care provider know about any changes, no matter how small.  If you are 40  or older, have a clinician do a breast exam (clinical breast exam or CBE) every year. Depending on your age, family history, and medical history, it may be recommended that you also have a yearly breast X-ray (mammogram).  If you  have a family history of breast cancer, talk with your health care provider about genetic screening.  If you are at high risk for breast cancer, talk with your health care provider about having an MRI and a mammogram every year.  Breast cancer (BRCA) gene test is recommended for women who have family members with BRCA-related cancers. Results of the assessment will determine the need for genetic counseling and BRCA1 and for BRCA2 testing. BRCA-related cancers include these types: ? Breast. This occurs in males or females. ? Ovarian. ? Tubal. This may also be called fallopian tube cancer. ? Cancer of the abdominal or pelvic lining (peritoneal cancer). ? Prostate. ? Pancreatic.  Cervical, Uterine, and Ovarian Cancer Your health care provider may recommend that you be screened regularly for cancer of the pelvic organs. These include your ovaries, uterus, and vagina. This screening involves a pelvic exam, which includes checking for microscopic changes to the surface of your cervix (Pap test).  For women ages 21-65, health care providers may recommend a pelvic exam and a Pap test every three years. For women ages 79-65, they may recommend the Pap test and pelvic exam, combined with testing for human papilloma virus (HPV), every five years. Some types of HPV increase your risk of cervical cancer. Testing for HPV may also be done on women of any age who have unclear Pap test results.  Other health care providers may not recommend any screening for nonpregnant women who are considered low risk for pelvic cancer and have no symptoms. Ask your health care provider if a screening pelvic exam is right for you.  If you have had past treatment for cervical cancer or a condition that could lead to cancer, you need Pap tests and screening for cancer for at least 20 years after your treatment. If Pap tests have been discontinued for you, your risk factors (such as having a new sexual partner) need to be  reassessed to determine if you should start having screenings again. Some women have medical problems that increase the chance of getting cervical cancer. In these cases, your health care provider may recommend that you have screening and Pap tests more often.  If you have a family history of uterine cancer or ovarian cancer, talk with your health care provider about genetic screening.  If you have vaginal bleeding after reaching menopause, tell your health care provider.  There are currently no reliable tests available to screen for ovarian cancer.  Lung Cancer Lung cancer screening is recommended for adults 69-62 years old who are at high risk for lung cancer because of a history of smoking. A yearly low-dose CT scan of the lungs is recommended if you:  Currently smoke.  Have a history of at least 30 pack-years of smoking and you currently smoke or have quit within the past 15 years. A pack-year is smoking an average of one pack of cigarettes per day for one year.  Yearly screening should:  Continue until it has been 15 years since you quit.  Stop if you develop a health problem that would prevent you from having lung cancer treatment.  Colorectal Cancer  This type of cancer can be detected and can often be prevented.  Routine colorectal cancer screening usually begins at  age 42 and continues through age 45.  If you have risk factors for colon cancer, your health care provider may recommend that you be screened at an earlier age.  If you have a family history of colorectal cancer, talk with your health care provider about genetic screening.  Your health care provider may also recommend using home test kits to check for hidden blood in your stool.  A small camera at the end of a tube can be used to examine your colon directly (sigmoidoscopy or colonoscopy). This is done to check for the earliest forms of colorectal cancer.  Direct examination of the colon should be repeated every  5-10 years until age 71. However, if early forms of precancerous polyps or small growths are found or if you have a family history or genetic risk for colorectal cancer, you may need to be screened more often.  Skin Cancer  Check your skin from head to toe regularly.  Monitor any moles. Be sure to tell your health care provider: ? About any new moles or changes in moles, especially if there is a change in a mole's shape or color. ? If you have a mole that is larger than the size of a pencil eraser.  If any of your family members has a history of skin cancer, especially at a young age, talk with your health care provider about genetic screening.  Always use sunscreen. Apply sunscreen liberally and repeatedly throughout the day.  Whenever you are outside, protect yourself by wearing long sleeves, pants, a wide-brimmed hat, and sunglasses.  What should I know about osteoporosis? Osteoporosis is a condition in which bone destruction happens more quickly than new bone creation. After menopause, you may be at an increased risk for osteoporosis. To help prevent osteoporosis or the bone fractures that can happen because of osteoporosis, the following is recommended:  If you are 46-71 years old, get at least 1,000 mg of calcium and at least 600 mg of vitamin D per day.  If you are older than age 55 but younger than age 65, get at least 1,200 mg of calcium and at least 600 mg of vitamin D per day.  If you are older than age 54, get at least 1,200 mg of calcium and at least 800 mg of vitamin D per day.  Smoking and excessive alcohol intake increase the risk of osteoporosis. Eat foods that are rich in calcium and vitamin D, and do weight-bearing exercises several times each week as directed by your health care provider. What should I know about how menopause affects my mental health? Depression may occur at any age, but it is more common as you become older. Common symptoms of depression  include:  Low or sad mood.  Changes in sleep patterns.  Changes in appetite or eating patterns.  Feeling an overall lack of motivation or enjoyment of activities that you previously enjoyed.  Frequent crying spells.  Talk with your health care provider if you think that you are experiencing depression. What should I know about immunizations? It is important that you get and maintain your immunizations. These include:  Tetanus, diphtheria, and pertussis (Tdap) booster vaccine.  Influenza every year before the flu season begins.  Pneumonia vaccine.  Shingles vaccine.  Your health care provider may also recommend other immunizations. This information is not intended to replace advice given to you by your health care provider. Make sure you discuss any questions you have with your health care provider. Document Released: 06/18/2005  Document Revised: 11/14/2015 Document Reviewed: 01/28/2015 Elsevier Interactive Patient Education  2018 Elsevier Inc.  

## 2016-11-30 NOTE — Progress Notes (Signed)
Patient: Michaela Hardy, Female    DOB: July 26, 1955, 61 y.o.   MRN: [id number] Visit Date: 11/30/2016  Today's Provider: Mar Daring, PA-C   Chief Complaint  Patient presents with  . Annual Exam   Subjective:    Annual physical exam Michaela Hardy is a 61 y.o. female who presents today for health maintenance and complete physical. She feels well. She reports exercising 3 times a week for 40 minutes. She reports she is sleeping well.  Last CPE:10/20/15 Mammogram: 11/23/16 BI-RADS 1 BMD:04/16/14-Osteopenia Pap: 09/2015 Per pt-GYN Normal, HPV-Negative Labs:09/22/2016 through GI Colonoscopy is scheduled 05/2017; Last done in 2009 and was normal.  Tdap: 2018-per pt -----------------------------------------------------------------   Review of Systems  Constitutional: Negative.   HENT: Negative.   Eyes: Negative.   Respiratory: Negative.   Cardiovascular: Negative.   Gastrointestinal: Negative.   Endocrine: Negative.   Genitourinary: Negative.   Musculoskeletal: Negative.   Skin: Negative.   Allergic/Immunologic: Negative.   Neurological: Negative.   Hematological: Negative.   Psychiatric/Behavioral: Negative.     Social History      She  reports that she quit smoking about 12 years ago. Her smoking use included Cigarettes. She has a 60.00 pack-year smoking history. She has never used smokeless tobacco. She reports that she drinks alcohol. She reports that she does not use drugs.       Social History   Social History  . Marital status: Married    Spouse name: N/A  . Number of children: N/A  . Years of education: N/A   Social History Main Topics  . Smoking status: Former Smoker    Packs/day: 1.50    Years: 40.00    Types: Cigarettes    Quit date: 05/10/2004  . Smokeless tobacco: Never Used  . Alcohol use Yes     Comment: social  . Drug use: No  . Sexual activity: Not Currently   Other Topics Concern  . None   Social History Narrative  .  None    Past Medical History:  Diagnosis Date  . Anxiety   . Depression   . GERD (gastroesophageal reflux disease)   . Hepatitis    Primary bilary cholangitis  . Hyperlipidemia   . Hypertension   . PONV (postoperative nausea and vomiting)      Patient Active Problem List   Diagnosis Date Noted  . Elevated blood sugar 10/20/2015  . Sinusitis, acute 05/07/2015  . Allergic sinusitis 01/14/2015  . Eustachian tube dysfunction 01/14/2015  . Primary biliary cholangitis ([place]) 11/15/2014  . Atypical migraine 08/10/2014  . Routine general medical examination at a health care facility 08/10/2014  . Clinical depression 08/10/2014  . Acid reflux 08/10/2014  . Helicobacter pylori gastrointestinal tract infection 08/10/2014  . Hypercholesteremia 08/10/2014  . BP (high blood pressure) 08/10/2014  . Cannot sleep 08/10/2014  . Cirrhosis, primary biliary 08/10/2014  . Gastro-esophageal reflux disease without esophagitis 08/10/2014    Past Surgical History:  Procedure Laterality Date  . APPENDECTOMY  1976  . BREAST BIOPSY Left 10/03/2014   Dr. Autumn Patty  . BREAST EXCISIONAL BIOPSY Left   . BREAST LUMPECTOMY WITH RADIOACTIVE SEED LOCALIZATION Left 11/21/2014   Procedure: LEFT BREAST LUMPECTOMY WITH RADIOACTIVE SEED LOCALIZATION;  Surgeon: Autumn Messing III, MD;  Location: [place];  Service: General;  Laterality: Left;  . CHOLECYSTECTOMY  06/29/2007  . LIVER BIOPSY  2008  . TUBAL LIGATION  1987    Family History  Family Status  Relation Status  . Mother Alive  . Father Deceased at age 40       Stroke  . Brother Alive  . MGM (Not Specified)  . Neg Hx (Not Specified)        Her family history includes Cancer in her father; Cerebrovascular Accident in her father; Coronary artery disease in her father; Glaucoma in her father; Heart attack in her father; Hypertension in her father and mother; Pancreatitis in her mother; Parkinson's disease in her maternal grandmother;  Thyroid disease in her mother.     No Known Allergies   Current Outpatient Prescriptions:  .  calcium carbonate (OS-CAL) 600 MG TABS tablet, Take 1 tablet by mouth daily with breakfast. , Disp: , Rfl:  .  citalopram (CELEXA) 20 MG tablet, Take 1 tablet (20 mg total) by mouth daily., Disp: 90 tablet, Rfl: 3 .  fluticasone (FLONASE) 50 MCG/ACT nasal spray, PLACE 2 SPRAYS INTO BOTH NOSTRILS DAILY., Disp: 48 g, Rfl: 3 .  losartan-hydrochlorothiazide (HYZAAR) 100-25 MG tablet, Take 1 tablet by mouth daily., Disp: 90 tablet, Rfl: 1 .  meloxicam (MOBIC) 15 MG tablet, TAKE 1 TABLET (15 MG TOTAL) BY MOUTH DAILY., Disp: 30 tablet, Rfl: 5 .  Multiple Vitamin tablet, Take 1 tablet by mouth daily., Disp: , Rfl:  .  omeprazole (PRILOSEC) 20 MG capsule, Take 1 capsule by mouth daily., Disp: , Rfl:  .  simvastatin (ZOCOR) 10 MG tablet, Take 1 tablet (10 mg total) by mouth daily., Disp: 90 tablet, Rfl: 3 .  ursodiol (ACTIGALL) 300 MG capsule, Take 2 capsules by mouth 2 (two) times daily., Disp: , Rfl:    Patient Care Team: Mar Daring, PA-C as PCP - General (Family Medicine)      Objective:   Vitals: BP 140/80 (BP Location: Left Arm, Patient Position: Sitting, Cuff Size: Normal)   Pulse 76   Temp 98.3 F (36.8 C) (Oral)   Resp 16   Ht 5\' 7"  (1.702 m)   Wt 209 lb 9.6 oz (95.1 kg)   BMI 32.83 kg/m     Physical Exam  Constitutional: She is oriented to person, place, and time. She appears well-developed and well-nourished. No distress.  HENT:  Head: Normocephalic and atraumatic.  Right Ear: Hearing, tympanic membrane, external ear and ear canal normal.  Left Ear: Hearing, tympanic membrane, external ear and ear canal normal.  Nose: Nose normal.  Mouth/Throat: Uvula is midline, oropharynx is clear and moist and mucous membranes are normal. No oropharyngeal exudate.  Eyes: Pupils are equal, round, and reactive to light. Conjunctivae and EOM are normal. Right eye exhibits no discharge. Left  eye exhibits no discharge. No scleral icterus.  Neck: Normal range of motion. Neck supple. No JVD present. Carotid bruit is not present. No tracheal deviation present. No thyromegaly present.  Cardiovascular: Normal rate, regular rhythm, normal heart sounds and intact distal pulses.  Exam reveals no gallop and no friction rub.   No murmur heard. Pulmonary/Chest: Effort normal and breath sounds normal. No respiratory distress. She has no decreased breath sounds. She has no wheezes. She has no rales. She exhibits no tenderness.  Abdominal: Soft. Bowel sounds are normal. She exhibits no distension and no mass. There is no tenderness. There is no rebound and no guarding.  Genitourinary:  Genitourinary Comments: Deferred to GYN, already done this year  Musculoskeletal: Normal range of motion. She exhibits no edema or tenderness.  Lymphadenopathy:    She has no cervical adenopathy.  Neurological:  She is alert and oriented to person, place, and time. She has normal reflexes.  Skin: Skin is warm and dry. No rash noted. She is not diaphoretic.  Psychiatric: She has a normal mood and affect. Her behavior is normal. Judgment and thought content normal.  Vitals reviewed.    Depression Screen PHQ 2/9 Scores 11/30/2016 10/20/2015 10/09/2014  PHQ - 2 Score 0 0 0  PHQ- 9 Score 0 - -      Assessment & Plan:     Routine Health Maintenance and Physical Exam  Exercise Activities and Dietary recommendations Goals    . Exercise 150 minutes per week (moderate activity)       Immunization History  Administered Date(s) Administered  . Hepatitis A 11/27/2008, 05/30/2009  . Hepatitis B 11/27/2008, 12/26/2008, 05/30/2009  . Hepatitis B, adult 01/22/2016  . Td 12/03/1998  . Tdap 06/29/2012    Health Maintenance  Topic Date Due  . HIV Screening  06/07/1971  . INFLUENZA VACCINE  12/08/2016  . COLONOSCOPY  05/30/2017  . PAP SMEAR  09/23/2017  . MAMMOGRAM  11/24/2018  . TETANUS/TDAP  06/29/2022  .  Hepatitis C Screening  Completed     Discussed health benefits of physical activity, and encouraged her to engage in regular exercise appropriate for her age and condition.    1. Annual physical exam Normal physical exam today. Will check labs as below and f/u pending lab results. If labs are stable and WNL she will not need to have these rechecked for one year at her next annual physical exam. She is to call the office in the meantime if she has any acute issue, questions or concerns. Reviewed labs from GI office as well. All were normal.   2. Elevated blood sugar Will check labs as below and f/u pending results. - Hemoglobin A1c  3. Hypercholesteremia Continue Simvastatin 10mg  daily. Will check labs as below and f/u pending results. - Lipid panel  4. Need for hepatitis B screening test Has had 3 vaccine series in 2010-2011. Was tested by her GI specialists in 2017 and was found to have a non reactive Hep B. She was then given a 4th injection as a booster. We will recheck the titer for surface antibody as below. If still Non reactive patient will most likely never develop antibodies, thus no more immunizations would be necessary.  - Hepatitis B Surface AntiBODY  5. Essential hypertension Stable. Continue losartan-HCTZ 100-25mg .   6. Primary biliary cholangitis ([place]) Followed by [facility], Dawson Bills, NP.  7. Benign paroxysmal positional vertigo of right ear Discussed pushing fluids and advised cause of BPPV. Instructed patient on how to perform the Brandt-Daroff exercises at home. She is to call if symptoms fail to improve or worsen.   8. BMI 32.0-32.9,adult Counseled patient on healthy lifestyle modifications including dieting and exercise.   --------------------------------------------------------------------    Mar Daring, PA-C  [place]

## 2016-12-03 DIAGNOSIS — E7800 Pure hypercholesterolemia, unspecified: Secondary | ICD-10-CM | POA: Diagnosis not present

## 2016-12-03 DIAGNOSIS — R739 Hyperglycemia, unspecified: Secondary | ICD-10-CM | POA: Diagnosis not present

## 2016-12-03 DIAGNOSIS — Z1159 Encounter for screening for other viral diseases: Secondary | ICD-10-CM | POA: Diagnosis not present

## 2016-12-04 LAB — LIPID PANEL
CHOLESTEROL TOTAL: 195 mg/dL (ref 100–199)
Chol/HDL Ratio: 3 ratio (ref 0.0–4.4)
HDL: 66 mg/dL (ref >39)
LDL CALC: 113 mg/dL — AB (ref 0–99)
TRIGLYCERIDES: 82 mg/dL (ref 0–149)
VLDL CHOLESTEROL CAL: 16 mg/dL (ref 5–40)

## 2016-12-04 LAB — HEMOGLOBIN A1C
Est. average glucose Bld gHb Est-mCnc: 117 mg/dL
Hgb A1c MFr Bld: 5.7 % — ABNORMAL HIGH (ref 4.8–5.6)

## 2016-12-04 LAB — HEPATITIS B SURFACE ANTIBODY,QUALITATIVE: Hep B Surface Ab, Qual: REACTIVE

## 2016-12-13 ENCOUNTER — Telehealth: Payer: Self-pay | Admitting: Physician Assistant

## 2016-12-13 DIAGNOSIS — H8111 Benign paroxysmal vertigo, right ear: Secondary | ICD-10-CM

## 2016-12-13 NOTE — Telephone Encounter (Signed)
Referral placed.

## 2016-12-13 NOTE — Telephone Encounter (Signed)
Pt called reporting that she has been doing the exercises but is feeling more dizzy.  Please advise [phone]  Thanks,  Con Memos

## 2016-12-13 NOTE — Telephone Encounter (Signed)
Can refer to ENT if patient agrees.

## 2016-12-15 DIAGNOSIS — H8112 Benign paroxysmal vertigo, left ear: Secondary | ICD-10-CM | POA: Diagnosis not present

## 2016-12-15 DIAGNOSIS — R42 Dizziness and giddiness: Secondary | ICD-10-CM | POA: Diagnosis not present

## 2016-12-22 DIAGNOSIS — H8112 Benign paroxysmal vertigo, left ear: Secondary | ICD-10-CM | POA: Diagnosis not present

## 2016-12-27 DIAGNOSIS — H8112 Benign paroxysmal vertigo, left ear: Secondary | ICD-10-CM | POA: Diagnosis not present

## 2017-02-09 DIAGNOSIS — H8112 Benign paroxysmal vertigo, left ear: Secondary | ICD-10-CM | POA: Diagnosis not present

## 2017-03-24 ENCOUNTER — Other Ambulatory Visit: Payer: Self-pay | Admitting: Physician Assistant

## 2017-03-27 ENCOUNTER — Other Ambulatory Visit: Payer: Self-pay | Admitting: Physician Assistant

## 2017-03-27 DIAGNOSIS — I10 Essential (primary) hypertension: Secondary | ICD-10-CM

## 2017-05-20 DIAGNOSIS — S0501XA Injury of conjunctiva and corneal abrasion without foreign body, right eye, initial encounter: Secondary | ICD-10-CM | POA: Diagnosis not present

## 2017-05-21 DIAGNOSIS — S0501XA Injury of conjunctiva and corneal abrasion without foreign body, right eye, initial encounter: Secondary | ICD-10-CM | POA: Diagnosis not present

## 2017-05-23 DIAGNOSIS — S0501XD Injury of conjunctiva and corneal abrasion without foreign body, right eye, subsequent encounter: Secondary | ICD-10-CM | POA: Diagnosis not present

## 2017-05-25 DIAGNOSIS — S0501XD Injury of conjunctiva and corneal abrasion without foreign body, right eye, subsequent encounter: Secondary | ICD-10-CM | POA: Diagnosis not present

## 2017-05-30 DIAGNOSIS — S0501XD Injury of conjunctiva and corneal abrasion without foreign body, right eye, subsequent encounter: Secondary | ICD-10-CM | POA: Diagnosis not present

## 2017-06-15 DIAGNOSIS — H8112 Benign paroxysmal vertigo, left ear: Secondary | ICD-10-CM | POA: Diagnosis not present

## 2017-06-21 DIAGNOSIS — K620 Anal polyp: Secondary | ICD-10-CM | POA: Diagnosis not present

## 2017-06-21 DIAGNOSIS — K5730 Diverticulosis of large intestine without perforation or abscess without bleeding: Secondary | ICD-10-CM | POA: Diagnosis not present

## 2017-06-21 DIAGNOSIS — Z1211 Encounter for screening for malignant neoplasm of colon: Secondary | ICD-10-CM | POA: Diagnosis not present

## 2017-06-21 DIAGNOSIS — K640 First degree hemorrhoids: Secondary | ICD-10-CM | POA: Diagnosis not present

## 2017-06-21 DIAGNOSIS — K621 Rectal polyp: Secondary | ICD-10-CM | POA: Diagnosis not present

## 2017-06-21 DIAGNOSIS — K648 Other hemorrhoids: Secondary | ICD-10-CM | POA: Diagnosis not present

## 2017-06-21 LAB — HM COLONOSCOPY

## 2017-06-24 ENCOUNTER — Other Ambulatory Visit: Payer: Self-pay | Admitting: Physician Assistant

## 2017-06-25 ENCOUNTER — Other Ambulatory Visit: Payer: Self-pay | Admitting: Physician Assistant

## 2017-07-07 ENCOUNTER — Encounter: Payer: Self-pay | Admitting: Physician Assistant

## 2017-07-07 ENCOUNTER — Ambulatory Visit (INDEPENDENT_AMBULATORY_CARE_PROVIDER_SITE_OTHER): Payer: BLUE CROSS/BLUE SHIELD | Admitting: Physician Assistant

## 2017-07-07 VITALS — BP 148/70 | HR 80 | Temp 98.3°F | Resp 16 | Wt 211.4 lb

## 2017-07-07 DIAGNOSIS — L989 Disorder of the skin and subcutaneous tissue, unspecified: Secondary | ICD-10-CM | POA: Diagnosis not present

## 2017-07-07 NOTE — Progress Notes (Signed)
       Patient: Michaela Hardy Female    DOB: March 30, 1956   62 y.o.   MRN: [id number] Visit Date: 07/07/2017  Today's Provider: Mar Daring, PA-C   Chief Complaint  Patient presents with  . spot on her chest   Subjective:    HPI Patient coming here today for this spot on her chest that is not going away. She noticed it 2 months ago and is unchanged. Tried. Neosporin. It does not bleed but it came up quickly and has not changed since. Does not itch or hurt.      No Known Allergies   Current Outpatient Medications:  .  calcium carbonate (OS-CAL) 600 MG TABS tablet, Take 1 tablet by mouth daily with breakfast. , Disp: , Rfl:  .  citalopram (CELEXA) 20 MG tablet, TAKE 1 TABLET (20 MG TOTAL) BY MOUTH DAILY., Disp: 90 tablet, Rfl: 3 .  losartan-hydrochlorothiazide (HYZAAR) 100-25 MG tablet, TAKE 1 TABLET BY MOUTH EVERY DAY, Disp: 90 tablet, Rfl: 1 .  meloxicam (MOBIC) 15 MG tablet, TAKE 1 TABLET (15 MG TOTAL) BY MOUTH DAILY., Disp: 30 tablet, Rfl: 5 .  Multiple Vitamin tablet, Take 1 tablet by mouth daily., Disp: , Rfl:  .  omeprazole (PRILOSEC) 20 MG capsule, Take 1 capsule by mouth daily., Disp: , Rfl:  .  simvastatin (ZOCOR) 10 MG tablet, TAKE 1 TABLET (10 MG TOTAL) BY MOUTH DAILY., Disp: 90 tablet, Rfl: 3 .  ursodiol (ACTIGALL) 300 MG capsule, Take 2 capsules by mouth 2 (two) times daily., Disp: , Rfl:   Review of Systems  Constitutional: Negative.   Respiratory: Negative.   Cardiovascular: Negative.   Gastrointestinal: Negative.   Neurological: Negative.     Social History   Tobacco Use  . Smoking status: Former Smoker    Packs/day: 1.50    Years: 40.00    Pack years: 60.00    Types: Cigarettes    Last attempt to quit: 05/10/2004    Years since quitting: 13.1  . Smokeless tobacco: Never Used  Substance Use Topics  . Alcohol use: Yes    Comment: social   Objective:   BP (!) 148/70 (BP Location: Left Arm, Patient Position: Sitting, Cuff Size: Normal)   Pulse  80   Temp 98.3 F (36.8 C) (Oral)   Resp 16   Wt 211 lb 6.4 oz (95.9 kg)   SpO2 98%   BMI 33.11 kg/m    Physical Exam  Constitutional: She appears well-developed and well-nourished. No distress.  Neck: Normal range of motion. Neck supple.  Cardiovascular: Normal rate, regular rhythm and normal heart sounds.  No murmur heard. Pulmonary/Chest: Effort normal and breath sounds normal. No respiratory distress.  Skin: Lesion noted. She is not diaphoretic.     Vitals reviewed.       Assessment & Plan:     1. Skin lesion Will place referral to Dr. Nehemiah Massed as below for further evaluation of the skin lesion and consideration for biopsy.  - Ambulatory referral to Dermatology       Mar Daring, PA-C  [facility]

## 2017-08-10 DIAGNOSIS — L818 Other specified disorders of pigmentation: Secondary | ICD-10-CM | POA: Diagnosis not present

## 2017-08-10 DIAGNOSIS — L432 Lichenoid drug reaction: Secondary | ICD-10-CM | POA: Diagnosis not present

## 2017-08-10 DIAGNOSIS — D485 Neoplasm of uncertain behavior of skin: Secondary | ICD-10-CM | POA: Diagnosis not present

## 2017-08-10 DIAGNOSIS — L570 Actinic keratosis: Secondary | ICD-10-CM | POA: Diagnosis not present

## 2017-08-10 DIAGNOSIS — L578 Other skin changes due to chronic exposure to nonionizing radiation: Secondary | ICD-10-CM | POA: Diagnosis not present

## 2017-09-13 DIAGNOSIS — L905 Scar conditions and fibrosis of skin: Secondary | ICD-10-CM | POA: Diagnosis not present

## 2017-09-13 DIAGNOSIS — B353 Tinea pedis: Secondary | ICD-10-CM | POA: Diagnosis not present

## 2017-09-13 DIAGNOSIS — D225 Melanocytic nevi of trunk: Secondary | ICD-10-CM | POA: Diagnosis not present

## 2017-09-23 ENCOUNTER — Other Ambulatory Visit: Payer: Self-pay | Admitting: Physician Assistant

## 2017-09-23 DIAGNOSIS — I10 Essential (primary) hypertension: Secondary | ICD-10-CM

## 2017-10-10 ENCOUNTER — Other Ambulatory Visit: Payer: Self-pay | Admitting: Physician Assistant

## 2017-10-18 ENCOUNTER — Other Ambulatory Visit: Payer: Self-pay | Admitting: Obstetrics and Gynecology

## 2017-10-18 DIAGNOSIS — Z1231 Encounter for screening mammogram for malignant neoplasm of breast: Secondary | ICD-10-CM

## 2017-10-22 ENCOUNTER — Other Ambulatory Visit: Payer: Self-pay | Admitting: Physician Assistant

## 2017-11-21 DIAGNOSIS — Z86018 Personal history of other benign neoplasm: Secondary | ICD-10-CM | POA: Diagnosis not present

## 2017-11-21 DIAGNOSIS — B353 Tinea pedis: Secondary | ICD-10-CM | POA: Diagnosis not present

## 2017-11-21 DIAGNOSIS — D485 Neoplasm of uncertain behavior of skin: Secondary | ICD-10-CM | POA: Diagnosis not present

## 2017-11-24 ENCOUNTER — Ambulatory Visit
Admission: RE | Admit: 2017-11-24 | Discharge: 2017-11-24 | Disposition: A | Discharge status: Home / Self Care | Payer: BLUE CROSS/BLUE SHIELD | Source: Ambulatory Visit | Attending: Obstetrics and Gynecology | Admitting: Obstetrics and Gynecology

## 2017-11-24 DIAGNOSIS — Z1231 Encounter for screening mammogram for malignant neoplasm of breast: Secondary | ICD-10-CM

## 2017-12-05 DIAGNOSIS — Z130 Encounter for screening for diseases of the blood and blood-forming organs and certain disorders involving the immune mechanism: Secondary | ICD-10-CM | POA: Diagnosis not present

## 2017-12-05 DIAGNOSIS — Z124 Encounter for screening for malignant neoplasm of cervix: Secondary | ICD-10-CM | POA: Diagnosis not present

## 2017-12-05 DIAGNOSIS — Z1389 Encounter for screening for other disorder: Secondary | ICD-10-CM | POA: Diagnosis not present

## 2017-12-05 DIAGNOSIS — Z01419 Encounter for gynecological examination (general) (routine) without abnormal findings: Secondary | ICD-10-CM | POA: Diagnosis not present

## 2017-12-06 ENCOUNTER — Encounter: Payer: Self-pay | Admitting: Physician Assistant

## 2017-12-06 ENCOUNTER — Ambulatory Visit (INDEPENDENT_AMBULATORY_CARE_PROVIDER_SITE_OTHER): Payer: BLUE CROSS/BLUE SHIELD | Admitting: Physician Assistant

## 2017-12-06 VITALS — BP 146/72 | HR 75 | Temp 98.4°F | Resp 16 | Ht 67.0 in | Wt 211.0 lb

## 2017-12-06 DIAGNOSIS — Z0000 Encounter for general adult medical examination without abnormal findings: Secondary | ICD-10-CM

## 2017-12-06 DIAGNOSIS — E7800 Pure hypercholesterolemia, unspecified: Secondary | ICD-10-CM | POA: Diagnosis not present

## 2017-12-06 DIAGNOSIS — R739 Hyperglycemia, unspecified: Secondary | ICD-10-CM

## 2017-12-06 DIAGNOSIS — Z124 Encounter for screening for malignant neoplasm of cervix: Secondary | ICD-10-CM | POA: Diagnosis not present

## 2017-12-06 DIAGNOSIS — K743 Primary biliary cirrhosis: Secondary | ICD-10-CM

## 2017-12-06 DIAGNOSIS — I10 Essential (primary) hypertension: Secondary | ICD-10-CM

## 2017-12-06 DIAGNOSIS — Z6833 Body mass index (BMI) 33.0-33.9, adult: Secondary | ICD-10-CM

## 2017-12-06 NOTE — Progress Notes (Signed)
Patient: Michaela Hardy, Female    DOB: 03/05/1956, 62 y.o.   MRN: [id number] Visit Date: 12/06/2017  Today's Provider: Mar Daring, PA-C   I, Martha Clan, CMA, am acting as scribe for [employer], PA-C.  Chief Complaint  Patient presents with  . Annual Exam   Subjective:    Annual physical exam Michaela Hardy is a 62 y.o. female who presents today for health maintenance and complete physical. She feels well. She reports exercising twice per week. Goes to the gym for about 1 hour. She reports she is sleeping well.  Last CPE- 11/30/2016 Last mammogram- 11/24/2017- BI-RADS 1 Sees GYN, and had her pap smear yesterday. Pt states Dr. Vira Agar performed a colonoscopy in February, 2019. -----------------------------------------------------------------   Review of Systems  Constitutional: Negative.   HENT: Negative.   Eyes: Negative.   Respiratory: Negative.   Cardiovascular: Negative.   Gastrointestinal: Negative.   Endocrine: Negative.   Genitourinary: Negative.   Musculoskeletal: Negative.   Skin: Negative.   Allergic/Immunologic: Negative.   Neurological: Positive for dizziness. Negative for tremors, seizures, syncope, facial asymmetry, speech difficulty, weakness, light-headedness, numbness and headaches.  Hematological: Negative.   Psychiatric/Behavioral: Negative.     Social History      She  reports that she quit smoking about 13 years ago. Her smoking use included cigarettes. She has a 60.00 pack-year smoking history. She has never used smokeless tobacco. She reports that she drinks alcohol. She reports that she does not use drugs.       Social History   Socioeconomic History  . Marital status: Married    Spouse name: Not on file  . Number of children: Not on file  . Years of education: Not on file  . Highest education level: Not on file  Occupational History  . Not on file  Social Needs  . Financial resource strain: Not on file    . Food insecurity:    Worry: Not on file    Inability: Not on file  . Transportation needs:    Medical: Not on file    Non-medical: Not on file  Tobacco Use  . Smoking status: Former Smoker    Packs/day: 1.50    Years: 40.00    Pack years: 60.00    Types: Cigarettes    Last attempt to quit: 05/10/2004    Years since quitting: 13.5  . Smokeless tobacco: Never Used  Substance and Sexual Activity  . Alcohol use: Yes    Comment: social  . Drug use: No  . Sexual activity: Not Currently  Lifestyle  . Physical activity:    Days per week: Not on file    Minutes per session: Not on file  . Stress: Not on file  Relationships  . Social connections:    Talks on phone: Not on file    Gets together: Not on file    Attends religious service: Not on file    Active member of club or organization: Not on file    Attends meetings of clubs or organizations: Not on file    Relationship status: Not on file  Other Topics Concern  . Not on file  Social History Narrative  . Not on file    Past Medical History:  Diagnosis Date  . Anxiety   . Depression   . GERD (gastroesophageal reflux disease)   . Hepatitis    Primary bilary cholangitis  . Hyperlipidemia   . Hypertension   .  PONV (postoperative nausea and vomiting)      Patient Active Problem List   Diagnosis Date Noted  . Elevated blood sugar 10/20/2015  . Allergic sinusitis 01/14/2015  . Eustachian tube dysfunction 01/14/2015  . Primary biliary cholangitis ([place]) 11/15/2014  . Atypical migraine 08/10/2014  . Clinical depression 08/10/2014  . Acid reflux 08/10/2014  . Helicobacter pylori gastrointestinal tract infection 08/10/2014  . Hypercholesteremia 08/10/2014  . BP (high blood pressure) 08/10/2014  . Cannot sleep 08/10/2014  . Cirrhosis, primary biliary 08/10/2014  . Gastro-esophageal reflux disease without esophagitis 08/10/2014    Past Surgical History:  Procedure Laterality Date  . APPENDECTOMY  1976  . BREAST  BIOPSY Left 10/03/2014   Dr. Autumn Patty  . BREAST EXCISIONAL BIOPSY Left   . BREAST LUMPECTOMY WITH RADIOACTIVE SEED LOCALIZATION Left 11/21/2014   Procedure: LEFT BREAST LUMPECTOMY WITH RADIOACTIVE SEED LOCALIZATION;  Surgeon: Autumn Messing III, MD;  Location: [place];  Service: General;  Laterality: Left;  . CHOLECYSTECTOMY  06/29/2007  . LIVER BIOPSY  2008  . TUBAL LIGATION  1987    Family History        Family Status  Relation Name Status  . Mother  Alive  . Father  Deceased at age 51       Stroke  . Brother  Alive  . MGM  (Not Specified)  . Neg Hx  (Not Specified)        Her family history includes Cancer in her father; Cerebrovascular Accident in her father; Coronary artery disease in her father; Glaucoma in her father; Heart attack in her father; Hypertension in her father and mother; Pancreatitis in her mother; Parkinson's disease in her maternal grandmother; Thyroid disease in her mother. There is no history of Breast cancer.      No Known Allergies   Current Outpatient Medications:  .  calcium carbonate (OS-CAL) 600 MG TABS tablet, Take 1 tablet by mouth daily with breakfast. , Disp: , Rfl:  .  citalopram (CELEXA) 20 MG tablet, TAKE 1 TABLET (20 MG TOTAL) BY MOUTH DAILY., Disp: 90 tablet, Rfl: 3 .  hydrochlorothiazide (HYDRODIURIL) 25 MG tablet, TAKE 1 TABLET BY MOUTH EVERY DAY WITH LOSARTAN, Disp: 90 tablet, Rfl: 1 .  losartan (COZAAR) 100 MG tablet, TAKE 1 TABLET BY MOUTH EVERY DAY WITH HCTZ, Disp: 90 tablet, Rfl: 1 .  meloxicam (MOBIC) 15 MG tablet, TAKE 1 TABLET (15 MG TOTAL) BY MOUTH DAILY., Disp: 30 tablet, Rfl: 5 .  Multiple Vitamin tablet, Take 1 tablet by mouth daily., Disp: , Rfl:  .  omeprazole (PRILOSEC) 20 MG capsule, Take 1 capsule by mouth daily., Disp: , Rfl:  .  simvastatin (ZOCOR) 10 MG tablet, TAKE 1 TABLET (10 MG TOTAL) BY MOUTH DAILY., Disp: 90 tablet, Rfl: 3 .  ursodiol (ACTIGALL) 300 MG capsule, Take 2 capsules by mouth 2 (two) times  daily., Disp: , Rfl:    Patient Care Team: Rubye Beach as PCP - General (Family Medicine)      Objective:   Vitals: BP (!) 146/72 (BP Location: Left Arm, Patient Position: Sitting, Cuff Size: Large)   Pulse 75   Temp 98.4 F (36.9 C) (Oral)   Resp 16   Ht 5\' 7"  (1.702 m)   Wt 211 lb (95.7 kg)   SpO2 96%   BMI 33.05 kg/m    Vitals:   12/06/17 1516  BP: (!) 146/72  Pulse: 75  Resp: 16  Temp: 98.4 F (36.9 C)  TempSrc: Oral  SpO2: 96%  Weight: 211 lb (95.7 kg)  Height: 5\' 7"  (1.702 m)     Physical Exam  Constitutional: She is oriented to person, place, and time. She appears well-developed and well-nourished. No distress.  HENT:  Head: Normocephalic and atraumatic.  Right Ear: Hearing, tympanic membrane, external ear and ear canal normal.  Left Ear: Hearing, tympanic membrane, external ear and ear canal normal.  Nose: Nose normal.  Mouth/Throat: Uvula is midline, oropharynx is clear and moist and mucous membranes are normal. No oropharyngeal exudate.  Eyes: Pupils are equal, round, and reactive to light. Conjunctivae and EOM are normal. Right eye exhibits no discharge. Left eye exhibits no discharge. No scleral icterus.  Neck: Normal range of motion. Neck supple. No JVD present. No tracheal deviation present. No thyromegaly present.  Cardiovascular: Normal rate, regular rhythm, normal heart sounds and intact distal pulses. Exam reveals no gallop and no friction rub.  No murmur heard. Pulmonary/Chest: Effort normal and breath sounds normal. No respiratory distress. She has no wheezes. She has no rales. She exhibits no tenderness.  Abdominal: Soft. Bowel sounds are normal. She exhibits no distension and no mass. There is no tenderness. There is no rebound and no guarding.  Musculoskeletal: Normal range of motion. She exhibits no edema or tenderness.  Lymphadenopathy:    She has no cervical adenopathy.  Neurological: She is alert and oriented to person, place,  and time.  Skin: Skin is warm and dry. No rash noted. She is not diaphoretic.  Psychiatric: She has a normal mood and affect. Her behavior is normal. Judgment and thought content normal.  Vitals reviewed.    Depression Screen PHQ 2/9 Scores 12/06/2017 11/30/2016 10/20/2015 10/09/2014  PHQ - 2 Score 0 0 0 0  PHQ- 9 Score - 0 - -      Assessment & Plan:     Routine Health Maintenance and Physical Exam  Exercise Activities and Dietary recommendations Goals    . Exercise 150 minutes per week (moderate activity)       Immunization History  Administered Date(s) Administered  . Hepatitis A 11/27/2008, 05/30/2009  . Hepatitis B 11/27/2008, 12/26/2008, 05/30/2009  . Hepatitis B, adult 01/22/2016  . Td 12/03/1998  . Tdap 06/29/2012, 06/04/2016    Health Maintenance  Topic Date Due  . HIV Screening  06/07/1971  . COLONOSCOPY  05/30/2017  . PAP SMEAR  09/23/2017  . INFLUENZA VACCINE  12/08/2017  . MAMMOGRAM  11/25/2019  . TETANUS/TDAP  06/04/2026  . Hepatitis C Screening  Completed     Discussed health benefits of physical activity, and encouraged her to engage in regular exercise appropriate for her age and condition.    1. Annual physical exam Normal physical exam today. Will check labs as below and f/u pending lab results. If labs are stable and WNL she will not need to have these rechecked for one year at her next annual physical exam. She is to call the office in the meantime if she has any acute issue, questions or concerns. - CBC w/Diff/Platelet - Comprehensive Metabolic Panel (CMET) - TSH - Lipid Profile - HgB A1c  2. Essential hypertension Stable. Continue HCTZ 25mg  , losartan 100mg . Will check labs as below and f/u pending results. - CBC w/Diff/Platelet - Comprehensive Metabolic Panel (CMET) - TSH - Lipid Profile - HgB A1c  3. Primary biliary cholangitis ([facility]) History of this. Stable. Will check labs as below and f/u pending results. - CBC  w/Diff/Platelet - Comprehensive Metabolic Panel (CMET) - TSH -  Lipid Profile - HgB A1c  4. Hypercholesteremia Stable. Continue Simvastatin 10mg . Will check labs as below and f/u pending results. - CBC w/Diff/Platelet - Comprehensive Metabolic Panel (CMET) - TSH - Lipid Profile - HgB A1c  5. Elevated blood sugar Diet controlled. Will check labs as below and f/u pending results. - CBC w/Diff/Platelet - Comprehensive Metabolic Panel (CMET) - TSH - Lipid Profile - HgB A1c  6. BMI 33.0-33.9,adult Counseled patient on healthy lifestyle modifications including dieting and exercise.   --------------------------------------------------------------------

## 2017-12-06 NOTE — Patient Instructions (Signed)
Health Maintenance for Postmenopausal Women Menopause is a normal process in which your reproductive ability comes to an end. This process happens gradually over a span of months to years, usually between the ages of 22 and 9. Menopause is complete when you have missed 12 consecutive menstrual periods. It is important to talk with your health care provider about some of the most common conditions that affect postmenopausal women, such as heart disease, cancer, and bone loss (osteoporosis). Adopting a healthy lifestyle and getting preventive care can help to promote your health and wellness. Those actions can also lower your chances of developing some of these common conditions. What should I know about menopause? During menopause, you may experience a number of symptoms, such as:  Moderate-to-severe hot flashes.  Night sweats.  Decrease in sex drive.  Mood swings.  Headaches.  Tiredness.  Irritability.  Memory problems.  Insomnia.  Choosing to treat or not to treat menopausal changes is an individual decision that you make with your health care provider. What should I know about hormone replacement therapy and supplements? Hormone therapy products are effective for treating symptoms that are associated with menopause, such as hot flashes and night sweats. Hormone replacement carries certain risks, especially as you become older. If you are thinking about using estrogen or estrogen with progestin treatments, discuss the benefits and risks with your health care provider. What should I know about heart disease and stroke? Heart disease, heart attack, and stroke become more likely as you age. This may be due, in part, to the hormonal changes that your body experiences during menopause. These can affect how your body processes dietary fats, triglycerides, and cholesterol. Heart attack and stroke are both medical emergencies. There are many things that you can do to help prevent heart disease  and stroke:  Have your blood pressure checked at least every 1-2 years. High blood pressure causes heart disease and increases the risk of stroke.  If you are 53-22 years old, ask your health care provider if you should take aspirin to prevent a heart attack or a stroke.  Do not use any tobacco products, including cigarettes, chewing tobacco, or electronic cigarettes. If you need help quitting, ask your health care provider.  It is important to eat a healthy diet and maintain a healthy weight. ? Be sure to include plenty of vegetables, fruits, low-fat dairy products, and lean protein. ? Avoid eating foods that are high in solid fats, added sugars, or salt (sodium).  Get regular exercise. This is one of the most important things that you can do for your health. ? Try to exercise for at least 150 minutes each week. The type of exercise that you do should increase your heart rate and make you sweat. This is known as moderate-intensity exercise. ? Try to do strengthening exercises at least twice each week. Do these in addition to the moderate-intensity exercise.  Know your numbers.Ask your health care provider to check your cholesterol and your blood glucose. Continue to have your blood tested as directed by your health care provider.  What should I know about cancer screening? There are several types of cancer. Take the following steps to reduce your risk and to catch any cancer development as early as possible. Breast Cancer  Practice breast self-awareness. ? This means understanding how your breasts normally appear and feel. ? It also means doing regular breast self-exams. Let your health care provider know about any changes, no matter how small.  If you are 40  or older, have a clinician do a breast exam (clinical breast exam or CBE) every year. Depending on your age, family history, and medical history, it may be recommended that you also have a yearly breast X-ray (mammogram).  If you  have a family history of breast cancer, talk with your health care provider about genetic screening.  If you are at high risk for breast cancer, talk with your health care provider about having an MRI and a mammogram every year.  Breast cancer (BRCA) gene test is recommended for women who have family members with BRCA-related cancers. Results of the assessment will determine the need for genetic counseling and BRCA1 and for BRCA2 testing. BRCA-related cancers include these types: ? Breast. This occurs in males or females. ? Ovarian. ? Tubal. This may also be called fallopian tube cancer. ? Cancer of the abdominal or pelvic lining (peritoneal cancer). ? Prostate. ? Pancreatic.  Cervical, Uterine, and Ovarian Cancer Your health care provider may recommend that you be screened regularly for cancer of the pelvic organs. These include your ovaries, uterus, and vagina. This screening involves a pelvic exam, which includes checking for microscopic changes to the surface of your cervix (Pap test).  For women ages 21-65, health care providers may recommend a pelvic exam and a Pap test every three years. For women ages 79-65, they may recommend the Pap test and pelvic exam, combined with testing for human papilloma virus (HPV), every five years. Some types of HPV increase your risk of cervical cancer. Testing for HPV may also be done on women of any age who have unclear Pap test results.  Other health care providers may not recommend any screening for nonpregnant women who are considered low risk for pelvic cancer and have no symptoms. Ask your health care provider if a screening pelvic exam is right for you.  If you have had past treatment for cervical cancer or a condition that could lead to cancer, you need Pap tests and screening for cancer for at least 20 years after your treatment. If Pap tests have been discontinued for you, your risk factors (such as having a new sexual partner) need to be  reassessed to determine if you should start having screenings again. Some women have medical problems that increase the chance of getting cervical cancer. In these cases, your health care provider may recommend that you have screening and Pap tests more often.  If you have a family history of uterine cancer or ovarian cancer, talk with your health care provider about genetic screening.  If you have vaginal bleeding after reaching menopause, tell your health care provider.  There are currently no reliable tests available to screen for ovarian cancer.  Lung Cancer Lung cancer screening is recommended for adults 69-62 years old who are at high risk for lung cancer because of a history of smoking. A yearly low-dose CT scan of the lungs is recommended if you:  Currently smoke.  Have a history of at least 30 pack-years of smoking and you currently smoke or have quit within the past 15 years. A pack-year is smoking an average of one pack of cigarettes per day for one year.  Yearly screening should:  Continue until it has been 15 years since you quit.  Stop if you develop a health problem that would prevent you from having lung cancer treatment.  Colorectal Cancer  This type of cancer can be detected and can often be prevented.  Routine colorectal cancer screening usually begins at  age 42 and continues through age 45.  If you have risk factors for colon cancer, your health care provider may recommend that you be screened at an earlier age.  If you have a family history of colorectal cancer, talk with your health care provider about genetic screening.  Your health care provider may also recommend using home test kits to check for hidden blood in your stool.  A small camera at the end of a tube can be used to examine your colon directly (sigmoidoscopy or colonoscopy). This is done to check for the earliest forms of colorectal cancer.  Direct examination of the colon should be repeated every  5-10 years until age 71. However, if early forms of precancerous polyps or small growths are found or if you have a family history or genetic risk for colorectal cancer, you may need to be screened more often.  Skin Cancer  Check your skin from head to toe regularly.  Monitor any moles. Be sure to tell your health care provider: ? About any new moles or changes in moles, especially if there is a change in a mole's shape or color. ? If you have a mole that is larger than the size of a pencil eraser.  If any of your family members has a history of skin cancer, especially at a young age, talk with your health care provider about genetic screening.  Always use sunscreen. Apply sunscreen liberally and repeatedly throughout the day.  Whenever you are outside, protect yourself by wearing long sleeves, pants, a wide-brimmed hat, and sunglasses.  What should I know about osteoporosis? Osteoporosis is a condition in which bone destruction happens more quickly than new bone creation. After menopause, you may be at an increased risk for osteoporosis. To help prevent osteoporosis or the bone fractures that can happen because of osteoporosis, the following is recommended:  If you are 46-71 years old, get at least 1,000 mg of calcium and at least 600 mg of vitamin D per day.  If you are older than age 55 but younger than age 65, get at least 1,200 mg of calcium and at least 600 mg of vitamin D per day.  If you are older than age 54, get at least 1,200 mg of calcium and at least 800 mg of vitamin D per day.  Smoking and excessive alcohol intake increase the risk of osteoporosis. Eat foods that are rich in calcium and vitamin D, and do weight-bearing exercises several times each week as directed by your health care provider. What should I know about how menopause affects my mental health? Depression may occur at any age, but it is more common as you become older. Common symptoms of depression  include:  Low or sad mood.  Changes in sleep patterns.  Changes in appetite or eating patterns.  Feeling an overall lack of motivation or enjoyment of activities that you previously enjoyed.  Frequent crying spells.  Talk with your health care provider if you think that you are experiencing depression. What should I know about immunizations? It is important that you get and maintain your immunizations. These include:  Tetanus, diphtheria, and pertussis (Tdap) booster vaccine.  Influenza every year before the flu season begins.  Pneumonia vaccine.  Shingles vaccine.  Your health care provider may also recommend other immunizations. This information is not intended to replace advice given to you by your health care provider. Make sure you discuss any questions you have with your health care provider. Document Released: 06/18/2005  Document Revised: 11/14/2015 Document Reviewed: 01/28/2015 Elsevier Interactive Patient Education  2018 Elsevier Inc.  

## 2017-12-07 DIAGNOSIS — R739 Hyperglycemia, unspecified: Secondary | ICD-10-CM | POA: Diagnosis not present

## 2017-12-07 DIAGNOSIS — Z0000 Encounter for general adult medical examination without abnormal findings: Secondary | ICD-10-CM | POA: Diagnosis not present

## 2017-12-07 DIAGNOSIS — I10 Essential (primary) hypertension: Secondary | ICD-10-CM | POA: Diagnosis not present

## 2017-12-07 DIAGNOSIS — E7800 Pure hypercholesterolemia, unspecified: Secondary | ICD-10-CM | POA: Diagnosis not present

## 2017-12-07 DIAGNOSIS — K743 Primary biliary cirrhosis: Secondary | ICD-10-CM | POA: Diagnosis not present

## 2017-12-08 ENCOUNTER — Telehealth: Payer: Self-pay

## 2017-12-08 LAB — LIPID PANEL
CHOL/HDL RATIO: 3.3 ratio (ref 0.0–4.4)
Cholesterol, Total: 204 mg/dL — ABNORMAL HIGH (ref 100–199)
HDL: 62 mg/dL (ref >39)
LDL Calculated: 120 mg/dL — ABNORMAL HIGH (ref 0–99)
Triglycerides: 109 mg/dL (ref 0–149)
VLDL Cholesterol Cal: 22 mg/dL (ref 5–40)

## 2017-12-08 LAB — CBC WITH DIFFERENTIAL/PLATELET
Basophils Absolute: 0 10*3/uL (ref 0.0–0.2)
Basos: 1 %
EOS (ABSOLUTE): 0.1 10*3/uL (ref 0.0–0.4)
Eos: 2 %
Hematocrit: 38.7 % (ref 34.0–46.6)
Hemoglobin: 12.7 g/dL (ref 11.1–15.9)
IMMATURE GRANULOCYTES: 0 %
Immature Grans (Abs): 0 10*3/uL (ref 0.0–0.1)
LYMPHS ABS: 1.5 10*3/uL (ref 0.7–3.1)
Lymphs: 27 %
MCH: 31 pg (ref 26.6–33.0)
MCHC: 32.8 g/dL (ref 31.5–35.7)
MCV: 94 fL (ref 79–97)
MONOS ABS: 0.7 10*3/uL (ref 0.1–0.9)
Monocytes: 12 %
NEUTROS PCT: 58 %
Neutrophils Absolute: 3.3 10*3/uL (ref 1.4–7.0)
PLATELETS: 345 10*3/uL (ref 150–450)
RBC: 4.1 x10E6/uL (ref 3.77–5.28)
RDW: 13.7 % (ref 12.3–15.4)
WBC: 5.6 10*3/uL (ref 3.4–10.8)

## 2017-12-08 LAB — COMPREHENSIVE METABOLIC PANEL
A/G RATIO: 1.7 (ref 1.2–2.2)
ALK PHOS: 142 IU/L — AB (ref 39–117)
ALT: 46 IU/L — AB (ref 0–32)
AST: 38 IU/L (ref 0–40)
Albumin: 4.5 g/dL (ref 3.6–4.8)
BUN / CREAT RATIO: 17 (ref 12–28)
BUN: 12 mg/dL (ref 8–27)
Bilirubin Total: 0.4 mg/dL (ref 0.0–1.2)
CALCIUM: 9.5 mg/dL (ref 8.7–10.3)
CHLORIDE: 98 mmol/L (ref 96–106)
CO2: 22 mmol/L (ref 20–29)
CREATININE: 0.7 mg/dL (ref 0.57–1.00)
GFR calc non Af Amer: 94 mL/min/{1.73_m2} (ref >59)
GFR, EST AFRICAN AMERICAN: 108 mL/min/{1.73_m2} (ref >59)
Globulin, Total: 2.6 g/dL (ref 1.5–4.5)
Glucose: 107 mg/dL — ABNORMAL HIGH (ref 65–99)
Potassium: 4.2 mmol/L (ref 3.5–5.2)
SODIUM: 139 mmol/L (ref 134–144)
Total Protein: 7.1 g/dL (ref 6.0–8.5)

## 2017-12-08 LAB — HEMOGLOBIN A1C
ESTIMATED AVERAGE GLUCOSE: 117 mg/dL
HEMOGLOBIN A1C: 5.7 % — AB (ref 4.8–5.6)

## 2017-12-08 LAB — TSH: TSH: 3.75 u[IU]/mL (ref 0.450–4.500)

## 2017-12-08 NOTE — Telephone Encounter (Signed)
-----   Message from Mar Daring, PA-C sent at 12/08/2017  1:08 PM EDT ----- Cholesterol has increased just slightly. Liver enzymes also borderline elevated compared to last year. Do you still see Dawson Bills, NP for your liver? Sugar is stable. Thyroid is normal. Blood count is normal.

## 2017-12-08 NOTE — Telephone Encounter (Signed)
LMTCB

## 2017-12-12 NOTE — Telephone Encounter (Signed)
Patient advised as directed below. Patient reports that she has a appointment with Dawson Bills at the end of the month.  Thanks,  -Jameila Keeny

## 2018-01-02 ENCOUNTER — Other Ambulatory Visit: Payer: Self-pay | Admitting: Nurse Practitioner

## 2018-01-02 DIAGNOSIS — K760 Fatty (change of) liver, not elsewhere classified: Secondary | ICD-10-CM | POA: Diagnosis not present

## 2018-01-02 DIAGNOSIS — K743 Primary biliary cirrhosis: Secondary | ICD-10-CM | POA: Diagnosis not present

## 2018-01-17 ENCOUNTER — Ambulatory Visit: Payer: BLUE CROSS/BLUE SHIELD

## 2018-01-27 ENCOUNTER — Other Ambulatory Visit: Payer: Self-pay | Admitting: Physician Assistant

## 2018-01-27 DIAGNOSIS — F419 Anxiety disorder, unspecified: Secondary | ICD-10-CM

## 2018-02-22 ENCOUNTER — Encounter: Payer: Self-pay | Admitting: Physician Assistant

## 2018-02-22 ENCOUNTER — Ambulatory Visit: Payer: BLUE CROSS/BLUE SHIELD | Admitting: Physician Assistant

## 2018-02-22 VITALS — BP 140/70 | HR 83 | Temp 98.3°F | Resp 16 | Wt 212.6 lb

## 2018-02-22 DIAGNOSIS — J020 Streptococcal pharyngitis: Secondary | ICD-10-CM | POA: Diagnosis not present

## 2018-02-22 DIAGNOSIS — H66002 Acute suppurative otitis media without spontaneous rupture of ear drum, left ear: Secondary | ICD-10-CM | POA: Diagnosis not present

## 2018-02-22 DIAGNOSIS — J029 Acute pharyngitis, unspecified: Secondary | ICD-10-CM

## 2018-02-22 LAB — POCT RAPID STREP A (OFFICE): RAPID STREP A SCREEN: POSITIVE — AB

## 2018-02-22 MED ORDER — AMOXICILLIN 875 MG PO TABS: 875.0000 mg | ORAL_TABLET | Freq: Two times a day (BID) | ORAL | 0 refills | Status: DC

## 2018-02-22 NOTE — Patient Instructions (Signed)

## 2018-02-22 NOTE — Progress Notes (Addendum)
Patient: Michaela Hardy Female    DOB: [birth year]   62 y.o.   MRN: [id number] Visit Date: 02/22/2018  Today's Provider: Mar Daring, PA-C   Chief Complaint  Patient presents with  . Sore Throat   Subjective:    Sore Throat   This is a new problem. The current episode started 1 to 4 weeks ago (2 weeks). The problem has been unchanged. Neither side of throat is experiencing more pain than the other. There has been no fever. The pain is moderate. Associated symptoms include ear pain (Left), a plugged ear sensation, swollen glands and trouble swallowing. Pertinent negatives include no congestion, coughing, ear discharge, hoarse voice or shortness of breath. She has tried gargles (tea,coffee and ice cream) for the symptoms. The treatment provided no relief.      No Known Allergies   Current Outpatient Medications:  .  calcium carbonate (OS-CAL) 600 MG TABS tablet, Take 1 tablet by mouth daily with breakfast. , Disp: , Rfl:  .  citalopram (CELEXA) 20 MG tablet, TAKE 1 TABLET (20 MG TOTAL) BY MOUTH DAILY., Disp: 90 tablet, Rfl: 2 .  hydrochlorothiazide (HYDRODIURIL) 25 MG tablet, TAKE 1 TABLET BY MOUTH EVERY DAY WITH LOSARTAN, Disp: 90 tablet, Rfl: 1 .  losartan (COZAAR) 100 MG tablet, TAKE 1 TABLET BY MOUTH EVERY DAY WITH HCTZ, Disp: 90 tablet, Rfl: 1 .  meloxicam (MOBIC) 15 MG tablet, TAKE 1 TABLET (15 MG TOTAL) BY MOUTH DAILY., Disp: 30 tablet, Rfl: 5 .  Multiple Vitamin tablet, Take 1 tablet by mouth daily., Disp: , Rfl:  .  omeprazole (PRILOSEC) 20 MG capsule, Take 1 capsule by mouth daily., Disp: , Rfl:  .  simvastatin (ZOCOR) 10 MG tablet, TAKE 1 TABLET (10 MG TOTAL) BY MOUTH DAILY., Disp: 90 tablet, Rfl: 3 .  ursodiol (ACTIGALL) 300 MG capsule, Take 2 capsules by mouth 2 (two) times daily., Disp: , Rfl:   Review of Systems  Constitutional: Negative for chills and fever.  HENT: Positive for ear pain (Left), sore throat and trouble swallowing. Negative for  congestion, ear discharge, hoarse voice, postnasal drip, rhinorrhea, sinus pressure, sinus pain and sneezing.   Respiratory: Negative for cough and shortness of breath.   Cardiovascular: Negative for chest pain, palpitations and leg swelling.    Social History   Tobacco Use  . Smoking status: Former Smoker    Packs/day: 1.50    Years: 40.00    Pack years: 60.00    Types: Cigarettes    Last attempt to quit: 05/10/2004    Years since quitting: 13.7  . Smokeless tobacco: Never Used  Substance Use Topics  . Alcohol use: Yes    Alcohol/week: 2.0 - 4.0 standard drinks    Types: 2 - 4 Glasses of wine per week   Objective:   BP 140/70 (BP Location: Left Arm, Patient Position: Sitting, Cuff Size: Normal)   Pulse 83   Temp 98.3 F (36.8 C) (Oral)   Resp 16   Wt 212 lb 9.6 oz (96.4 kg)   SpO2 97%   BMI 33.30 kg/m  Vitals:   02/22/18 0841  BP: 140/70  Pulse: 83  Resp: 16  Temp: 98.3 F (36.8 C)  TempSrc: Oral  SpO2: 97%  Weight: 212 lb 9.6 oz (96.4 kg)     Physical Exam  Constitutional: She appears well-developed and well-nourished. No distress.  HENT:  Head: Normocephalic and atraumatic.  Right Ear: Hearing, tympanic membrane, external ear and ear  canal normal.  Left Ear: Hearing, external ear and ear canal normal. Tympanic membrane is erythematous. Tympanic membrane is not bulging. A middle ear effusion (opaque) is present.  Nose: Mucosal edema present. No rhinorrhea. Right sinus exhibits no maxillary sinus tenderness and no frontal sinus tenderness. Left sinus exhibits no maxillary sinus tenderness and no frontal sinus tenderness.  Mouth/Throat: Uvula is midline and mucous membranes are normal. Posterior oropharyngeal erythema present. No oropharyngeal exudate or posterior oropharyngeal edema. Tonsils are 1+ on the right. Tonsils are 1+ on the left.  Eyes: Pupils are equal, round, and reactive to light. Conjunctivae are normal. Right eye exhibits no discharge. Left eye  exhibits no discharge. No scleral icterus.  Neck: Normal range of motion. Neck supple. No tracheal deviation present. No thyromegaly present.  Cardiovascular: Normal rate, regular rhythm and normal heart sounds. Exam reveals no gallop and no friction rub.  No murmur heard. Pulmonary/Chest: Effort normal and breath sounds normal. No stridor. No respiratory distress. She has no wheezes. She has no rales.  Lymphadenopathy:    She has no cervical adenopathy.  Skin: Skin is warm and dry. She is not diaphoretic.  Vitals reviewed.       Assessment & Plan:     1. Non-recurrent acute suppurative otitis media of left ear without spontaneous rupture of tympanic membrane Worsening symptoms that have not responded to OTC medications. Will give amoxil as below. Continue allergy medications. Stay well hydrated and get plenty of rest. Call if no symptom improvement or if symptoms worsen. - amoxicillin (AMOXIL) 875 MG tablet; Take 1 tablet (875 mg total) by mouth 2 (two) times daily.  Dispense: 14 tablet; Refill: 0  2. Sore throat Strep negative.  - POCT rapid strep A  3. Strep throat Strep positive. Already on Amoxil as noted above.  - amoxicillin (AMOXIL) 875 MG tablet; Take 1 tablet (875 mg total) by mouth 2 (two) times daily.  Dispense: 14 tablet; Refill: 0       Mar Daring, PA-C  [facility]

## 2018-02-25 ENCOUNTER — Other Ambulatory Visit: Payer: Self-pay | Admitting: Nurse Practitioner

## 2018-02-25 ENCOUNTER — Ambulatory Visit
Admission: RE | Admit: 2018-02-25 | Discharge: 2018-02-25 | Disposition: A | Discharge status: Home / Self Care | Payer: BLUE CROSS/BLUE SHIELD | Source: Ambulatory Visit | Attending: Nurse Practitioner | Admitting: Nurse Practitioner

## 2018-02-25 DIAGNOSIS — R933 Abnormal findings on diagnostic imaging of other parts of digestive tract: Secondary | ICD-10-CM | POA: Diagnosis not present

## 2018-02-25 DIAGNOSIS — I700 Atherosclerosis of aorta: Secondary | ICD-10-CM | POA: Diagnosis not present

## 2018-02-25 DIAGNOSIS — Z9049 Acquired absence of other specified parts of digestive tract: Secondary | ICD-10-CM | POA: Diagnosis not present

## 2018-02-25 DIAGNOSIS — K760 Fatty (change of) liver, not elsewhere classified: Secondary | ICD-10-CM

## 2018-02-25 DIAGNOSIS — K743 Primary biliary cirrhosis: Secondary | ICD-10-CM | POA: Insufficient documentation

## 2018-02-25 LAB — POCT I-STAT CREATININE: Creatinine, Ser: 0.6 mg/dL (ref 0.44–1.00)

## 2018-02-25 MED ORDER — GADOBUTROL 1 MMOL/ML IV SOLN
9.0000 mL | Freq: Once | INTRAVENOUS | Status: AC | PRN
  Administered 2018-02-25: 9.5 mL via INTRAVENOUS

## 2018-03-16 ENCOUNTER — Other Ambulatory Visit: Payer: Self-pay | Admitting: Physician Assistant

## 2018-04-03 ENCOUNTER — Other Ambulatory Visit: Payer: Self-pay | Admitting: Physician Assistant

## 2018-04-03 DIAGNOSIS — I10 Essential (primary) hypertension: Secondary | ICD-10-CM

## 2018-07-06 ENCOUNTER — Other Ambulatory Visit: Payer: Self-pay | Admitting: Physician Assistant

## 2018-07-15 ENCOUNTER — Other Ambulatory Visit: Payer: Self-pay | Admitting: Physician Assistant

## 2018-07-15 DIAGNOSIS — F419 Anxiety disorder, unspecified: Secondary | ICD-10-CM

## 2018-07-31 ENCOUNTER — Telehealth: Payer: Self-pay | Admitting: Physician Assistant

## 2018-07-31 NOTE — Telephone Encounter (Signed)
Pt's insurance has changed to her pharmacy to [facility].  Fax # [phone]  Thanks Con Memos

## 2018-08-15 DIAGNOSIS — K760 Fatty (change of) liver, not elsewhere classified: Secondary | ICD-10-CM | POA: Diagnosis not present

## 2018-08-15 DIAGNOSIS — K219 Gastro-esophageal reflux disease without esophagitis: Secondary | ICD-10-CM | POA: Diagnosis not present

## 2018-08-15 DIAGNOSIS — K743 Primary biliary cirrhosis: Secondary | ICD-10-CM | POA: Diagnosis not present

## 2018-08-30 ENCOUNTER — Other Ambulatory Visit: Payer: Self-pay | Admitting: Nurse Practitioner

## 2018-08-30 DIAGNOSIS — K743 Primary biliary cirrhosis: Secondary | ICD-10-CM

## 2018-08-30 DIAGNOSIS — K760 Fatty (change of) liver, not elsewhere classified: Secondary | ICD-10-CM

## 2018-09-01 ENCOUNTER — Other Ambulatory Visit: Payer: Self-pay | Admitting: Nurse Practitioner

## 2018-09-01 DIAGNOSIS — K743 Primary biliary cirrhosis: Secondary | ICD-10-CM

## 2018-09-01 DIAGNOSIS — K760 Fatty (change of) liver, not elsewhere classified: Secondary | ICD-10-CM

## 2018-09-19 ENCOUNTER — Telehealth: Payer: Self-pay | Admitting: Physician Assistant

## 2018-09-19 DIAGNOSIS — I10 Essential (primary) hypertension: Secondary | ICD-10-CM

## 2018-09-19 MED ORDER — HYDROCHLOROTHIAZIDE 25 MG PO TABS: ORAL_TABLET | ORAL | 0 refills | Status: DC

## 2018-09-19 MED ORDER — LOSARTAN POTASSIUM 100 MG PO TABS: ORAL_TABLET | ORAL | 0 refills | Status: DC

## 2018-09-19 NOTE — Telephone Encounter (Signed)
Pt called saying her pharmacy has tried to contact us twice for her refill  On  Losartan 100 mg  and Hydrodiuril 25 mg  [facility]  Phone #  [phone]  CB#  [phone]  Con Memos

## 2018-09-25 ENCOUNTER — Other Ambulatory Visit: Payer: Self-pay

## 2018-09-25 DIAGNOSIS — I10 Essential (primary) hypertension: Secondary | ICD-10-CM

## 2018-09-25 MED ORDER — LOSARTAN POTASSIUM 100 MG PO TABS: ORAL_TABLET | ORAL | 0 refills | Status: DC

## 2018-09-25 NOTE — Telephone Encounter (Signed)
Patient called requesting a 30 day refill on her Losartan 100 MG tablet send to [facility] due to her mail pharmacy not been able to fill medication until the end of June. Please advise.

## 2018-09-27 ENCOUNTER — Other Ambulatory Visit: Payer: Self-pay

## 2018-09-27 DIAGNOSIS — I10 Essential (primary) hypertension: Secondary | ICD-10-CM

## 2018-09-27 MED ORDER — LOSARTAN POTASSIUM 100 MG PO TABS: ORAL_TABLET | ORAL | 0 refills | Status: DC

## 2018-10-06 DIAGNOSIS — K760 Fatty (change of) liver, not elsewhere classified: Secondary | ICD-10-CM | POA: Diagnosis not present

## 2018-10-06 DIAGNOSIS — K743 Primary biliary cirrhosis: Secondary | ICD-10-CM | POA: Diagnosis not present

## 2018-10-16 ENCOUNTER — Other Ambulatory Visit: Payer: Self-pay | Admitting: Nurse Practitioner

## 2018-10-16 DIAGNOSIS — K743 Primary biliary cirrhosis: Secondary | ICD-10-CM

## 2018-10-16 DIAGNOSIS — K760 Fatty (change of) liver, not elsewhere classified: Secondary | ICD-10-CM

## 2018-10-17 ENCOUNTER — Other Ambulatory Visit: Payer: Self-pay

## 2018-10-17 ENCOUNTER — Ambulatory Visit
Admission: RE | Admit: 2018-10-17 | Discharge: 2018-10-17 | Disposition: A | Discharge status: Home / Self Care | Payer: BLUE CROSS/BLUE SHIELD | Source: Ambulatory Visit | Attending: Nurse Practitioner | Admitting: Nurse Practitioner

## 2018-10-17 DIAGNOSIS — K760 Fatty (change of) liver, not elsewhere classified: Secondary | ICD-10-CM | POA: Diagnosis not present

## 2018-10-17 DIAGNOSIS — K8309 Other cholangitis: Secondary | ICD-10-CM | POA: Diagnosis not present

## 2018-10-17 DIAGNOSIS — K743 Primary biliary cirrhosis: Secondary | ICD-10-CM | POA: Insufficient documentation

## 2018-10-21 ENCOUNTER — Other Ambulatory Visit: Payer: Self-pay | Admitting: Physician Assistant

## 2018-10-21 DIAGNOSIS — I10 Essential (primary) hypertension: Secondary | ICD-10-CM

## 2018-10-22 ENCOUNTER — Other Ambulatory Visit: Payer: Self-pay | Admitting: Physician Assistant

## 2018-10-22 DIAGNOSIS — I10 Essential (primary) hypertension: Secondary | ICD-10-CM

## 2018-10-23 ENCOUNTER — Other Ambulatory Visit: Payer: Self-pay | Admitting: Obstetrics and Gynecology

## 2018-10-23 DIAGNOSIS — Z1231 Encounter for screening mammogram for malignant neoplasm of breast: Secondary | ICD-10-CM

## 2018-11-06 ENCOUNTER — Telehealth: Payer: Self-pay | Admitting: Physician Assistant

## 2018-11-06 DIAGNOSIS — I10 Essential (primary) hypertension: Secondary | ICD-10-CM

## 2018-11-06 MED ORDER — TELMISARTAN 80 MG PO TABS: 80.0000 mg | ORAL_TABLET | Freq: Every day | ORAL | 1 refills | Status: DC

## 2018-11-06 NOTE — Telephone Encounter (Signed)
Losartan changed to Telmisartan due to patient having trouble obtaining losartan due to backorder. Telmisartan 80mg  was sent to [facility]

## 2018-11-06 NOTE — Telephone Encounter (Signed)
Per pharmacist Losartan 50mg  is on back order and with the 100mg  they get it through the week but is limited supply.

## 2018-11-06 NOTE — Telephone Encounter (Signed)
Can we call and see if her pharmacy has losartan 50mg  please?

## 2018-11-06 NOTE — Telephone Encounter (Signed)
Please Review

## 2018-11-06 NOTE — Telephone Encounter (Signed)
Pt is on Losartan 100 mg.  She is due a refill but she states she is having a hard time getting it from the pharmacy.  It has been on backstock.  She wants to know if there is something else she can instead of Losartan   CB#  [phone]  Con Memos

## 2018-12-03 ENCOUNTER — Other Ambulatory Visit: Payer: Self-pay | Admitting: Physician Assistant

## 2018-12-03 DIAGNOSIS — I10 Essential (primary) hypertension: Secondary | ICD-10-CM

## 2018-12-08 ENCOUNTER — Other Ambulatory Visit: Payer: Self-pay

## 2018-12-08 ENCOUNTER — Ambulatory Visit
Admission: RE | Admit: 2018-12-08 | Discharge: 2018-12-08 | Disposition: A | Discharge status: Home / Self Care | Payer: BLUE CROSS/BLUE SHIELD | Source: Ambulatory Visit | Attending: Obstetrics and Gynecology | Admitting: Obstetrics and Gynecology

## 2018-12-08 DIAGNOSIS — Z1231 Encounter for screening mammogram for malignant neoplasm of breast: Secondary | ICD-10-CM

## 2018-12-19 ENCOUNTER — Other Ambulatory Visit: Payer: Self-pay

## 2018-12-19 ENCOUNTER — Encounter: Payer: Self-pay | Admitting: Physician Assistant

## 2018-12-19 ENCOUNTER — Ambulatory Visit: Payer: BLUE CROSS/BLUE SHIELD | Admitting: Physician Assistant

## 2018-12-19 VITALS — BP 110/72 | HR 94 | Temp 98.4°F | Resp 16 | Ht 67.0 in | Wt 211.0 lb

## 2018-12-19 DIAGNOSIS — B353 Tinea pedis: Secondary | ICD-10-CM

## 2018-12-19 MED ORDER — KETOCONAZOLE 2 % EX CREA: 1.0000 "application " | TOPICAL_CREAM | Freq: Every day | CUTANEOUS | 5 refills | Status: DC

## 2018-12-19 NOTE — Progress Notes (Signed)
Patient: Michaela Hardy Female    DOB: 1955/11/30   63 y.o.   MRN: [id number] Visit Date: 12/19/2018  Today's Provider: Mar Daring, PA-C   Chief Complaint  Patient presents with  . Foot Problem   Subjective:     HPI   Patient is here concerning her feet peeling. Patient states she was treated for this by dermatology (Dr. Hall Busing) about 2 years ago. Patient states she given Kenalog cream for a foot fungus, which helped with the peeling on her feet.   No Known Allergies   Current Outpatient Medications:  .  calcium carbonate (OS-CAL) 600 MG TABS tablet, Take 1 tablet by mouth daily with breakfast. , Disp: , Rfl:  .  hydrochlorothiazide (HYDRODIURIL) 25 MG tablet, TAKE 1 TABLET BY MOUTH EVERY DAY WITH LOSARTAN, Disp: 90 tablet, Rfl: 1 .  meloxicam (MOBIC) 15 MG tablet, TAKE 1 TABLET BY MOUTH EVERY DAY, Disp: 90 tablet, Rfl: 1 .  Multiple Vitamin tablet, Take 1 tablet by mouth daily., Disp: , Rfl:  .  omeprazole (PRILOSEC) 20 MG capsule, Take 1 capsule by mouth daily., Disp: , Rfl:  .  simvastatin (ZOCOR) 10 MG tablet, TAKE 1 TABLET (10 MG TOTAL) BY MOUTH DAILY., Disp: 90 tablet, Rfl: 3 .  telmisartan (MICARDIS) 80 MG tablet, Take 1 tablet (80 mg total) by mouth daily., Disp: 90 tablet, Rfl: 1 .  ursodiol (ACTIGALL) 300 MG capsule, Take 2 capsules by mouth 2 (two) times daily., Disp: , Rfl:  .  amoxicillin (AMOXIL) 875 MG tablet, Take 1 tablet (875 mg total) by mouth 2 (two) times daily. (Patient not taking: Reported on 12/19/2018), Disp: 14 tablet, Rfl: 0 .  citalopram (CELEXA) 20 MG tablet, TAKE 1 TABLET BY MOUTH EVERY DAY (Patient not taking: Reported on 12/19/2018), Disp: 90 tablet, Rfl: 3  Review of Systems  Constitutional: Negative for appetite change, chills, fatigue and fever.  Respiratory: Negative for chest tightness and shortness of breath.   Cardiovascular: Negative for chest pain and palpitations.  Gastrointestinal: Negative for abdominal pain, nausea  and vomiting.  Skin:       Feet with peeling skin  Neurological: Negative for dizziness and weakness.    Social History   Tobacco Use  . Smoking status: Former Smoker    Packs/day: 1.50    Years: 40.00    Pack years: 60.00    Types: Cigarettes    Quit date: 05/10/2004    Years since quitting: 14.6  . Smokeless tobacco: Never Used  Substance Use Topics  . Alcohol use: Yes    Alcohol/week: 2.0 - 4.0 standard drinks    Types: 2 - 4 Glasses of wine per week      Objective:   BP 110/72 (BP Location: Right Arm, Patient Position: Sitting, Cuff Size: Large)   Pulse 94   Temp 98.4 F (36.9 C) (Oral)   Resp 16   Ht 5\' 7"  (1.702 m)   Wt 211 lb (95.7 kg)   SpO2 97%   BMI 33.05 kg/m  Vitals:   12/19/18 1337  BP: 110/72  Pulse: 94  Resp: 16  Temp: 98.4 F (36.9 C)  TempSrc: Oral  SpO2: 97%  Weight: 211 lb (95.7 kg)  Height: 5\' 7"  (1.702 m)     Physical Exam Vitals signs reviewed.  Constitutional:      General: She is not in acute distress.    Appearance: Normal appearance. She is well-developed. She is obese. She is not  ill-appearing or diaphoretic.  Neck:     Musculoskeletal: Normal range of motion and neck supple.     Thyroid: No thyromegaly.     Vascular: No JVD.     Trachea: No tracheal deviation.  Cardiovascular:     Rate and Rhythm: Normal rate and regular rhythm.     Pulses: Normal pulses.     Heart sounds: Normal heart sounds. No murmur. No friction rub. No gallop.   Pulmonary:     Effort: Pulmonary effort is normal. No respiratory distress.     Breath sounds: Normal breath sounds. No wheezing or rales.  Lymphadenopathy:     Cervical: No cervical adenopathy.  Skin:    Comments: Peeling skin of feet with pink skin underneath  Neurological:     Mental Status: She is alert.     No results found for any visits on 12/19/18.     Assessment & Plan    1. Tinea pedis of both feet This medication has been helping. Will refill for patient. Call if  worsening.  - ketoconazole (NIZORAL) 2 % cream; Apply 1 application topically daily.  Dispense: 60 g; Refill: Nina, PA-C  [facility]

## 2018-12-19 NOTE — Patient Instructions (Signed)
Athlete's Foot  Athlete's foot (tinea pedis) is a fungal infection of the skin on your feet. It often occurs on the skin that is between or underneath your toes. It can also occur on the soles of your feet. Symptoms include itchy or white and flaky areas on the skin. The infection can spread from person to person (is contagious). It can also spread when a person's bare feet come in contact with the fungus on shower floors or on items such as shoes. Follow these instructions at home: Medicines  Apply or take over-the-counter and prescription medicines only as told by your doctor.  Apply your antifungal medicine as told by your doctor. Do not stop using the medicine even if your feet start to get better. Foot care  Do not scratch your feet.  Keep your feet dry: ? Wear cotton or wool socks. Change your socks every day or if they become wet. ? Wear shoes that allow air to move around, such as sandals or canvas tennis shoes.  Wash and dry your feet: ? Every day or as told by your doctor. ? After exercising. ? Including the area between your toes. General instructions  Do not share any of these items that touch your feet: ? Towels. ? Shoes. ? Nail clippers. ? Other personal items.  Protect your feet by wearing sandals in wet areas, such as locker rooms and shared showers.  Keep all follow-up visits as told by your doctor. This is important.  If you have diabetes, keep your blood sugar under control. Contact a doctor if:  You have a fever.  You have swelling, pain, warmth, or redness in your foot.  Your feet are not getting better with treatment.  Your symptoms get worse.  You have new symptoms. Summary  Athlete's foot is a fungal infection of the skin on your feet.  Symptoms include itchy or white and flaky areas on the skin.  Apply your antifungal medicine as told by your doctor.  Keep your feet clean and dry. This information is not intended to replace advice given  to you by your health care provider. Make sure you discuss any questions you have with your health care provider. Document Released: 10/13/2007 Document Revised: 04/21/2017 Document Reviewed: 02/14/2017 Elsevier Patient Education  2020 Elsevier Inc.  

## 2019-01-18 DIAGNOSIS — H2513 Age-related nuclear cataract, bilateral: Secondary | ICD-10-CM | POA: Diagnosis not present

## 2019-01-18 DIAGNOSIS — H35033 Hypertensive retinopathy, bilateral: Secondary | ICD-10-CM | POA: Diagnosis not present

## 2019-01-18 DIAGNOSIS — I10 Essential (primary) hypertension: Secondary | ICD-10-CM | POA: Diagnosis not present

## 2019-01-22 DIAGNOSIS — K743 Primary biliary cirrhosis: Secondary | ICD-10-CM | POA: Diagnosis not present

## 2019-01-22 DIAGNOSIS — K760 Fatty (change of) liver, not elsewhere classified: Secondary | ICD-10-CM | POA: Diagnosis not present

## 2019-02-07 ENCOUNTER — Other Ambulatory Visit: Payer: Self-pay

## 2019-02-07 ENCOUNTER — Ambulatory Visit (INDEPENDENT_AMBULATORY_CARE_PROVIDER_SITE_OTHER): Payer: BLUE CROSS/BLUE SHIELD

## 2019-02-07 DIAGNOSIS — Z23 Encounter for immunization: Secondary | ICD-10-CM

## 2019-02-23 DIAGNOSIS — H25811 Combined forms of age-related cataract, right eye: Secondary | ICD-10-CM | POA: Diagnosis not present

## 2019-02-23 DIAGNOSIS — Z01818 Encounter for other preprocedural examination: Secondary | ICD-10-CM | POA: Diagnosis not present

## 2019-02-23 DIAGNOSIS — H2511 Age-related nuclear cataract, right eye: Secondary | ICD-10-CM | POA: Diagnosis not present

## 2019-02-27 NOTE — Progress Notes (Signed)
Patient: Michaela Hardy Female    DOB: 04-24-1956   63 y.o.   MRN: [id number] Visit Date: 02/28/2019  Today's Provider: Mar Daring, PA-C   Chief Complaint  Patient presents with  . Hypertension   Subjective:     HPI  Patient here with c/o Elevated Blood Pressure on Friday during cataract surgery 188/90. Diet is adherent to low salt. She reports walking for exercise.She reports that she has not check her blood pressure after that. She denies chest pain, lightheadedness, headaches.She still taking her Telmisartan and Hydrochlorothiazide.  No Known Allergies   Current Outpatient Medications:  .  calcium carbonate (OS-CAL) 600 MG TABS tablet, Take 1 tablet by mouth daily with breakfast. , Disp: , Rfl:  .  hydrochlorothiazide (HYDRODIURIL) 25 MG tablet, TAKE 1 TABLET BY MOUTH EVERY DAY WITH LOSARTAN, Disp: 90 tablet, Rfl: 1 .  ketoconazole (NIZORAL) 2 % cream, Apply 1 application topically daily., Disp: 60 g, Rfl: 5 .  meloxicam (MOBIC) 15 MG tablet, TAKE 1 TABLET BY MOUTH EVERY DAY, Disp: 90 tablet, Rfl: 1 .  Multiple Vitamin tablet, Take 1 tablet by mouth daily., Disp: , Rfl:  .  omeprazole (PRILOSEC) 20 MG capsule, Take 1 capsule by mouth daily., Disp: , Rfl:  .  simvastatin (ZOCOR) 10 MG tablet, TAKE 1 TABLET (10 MG TOTAL) BY MOUTH DAILY., Disp: 90 tablet, Rfl: 3 .  telmisartan (MICARDIS) 80 MG tablet, Take 1 tablet (80 mg total) by mouth daily., Disp: 90 tablet, Rfl: 1 .  ursodiol (ACTIGALL) 300 MG capsule, Take 2 capsules by mouth 2 (two) times daily., Disp: , Rfl:   Review of Systems  Constitutional: Negative.   Eyes: Negative for visual disturbance.  Respiratory: Negative.   Cardiovascular: Negative for chest pain, palpitations and leg swelling.  Neurological: Negative for dizziness, light-headedness and headaches.    Social History   Tobacco Use  . Smoking status: Former Smoker    Packs/day: 1.50    Years: 40.00    Pack years: 60.00    Types:  Cigarettes    Quit date: 05/10/2004    Years since quitting: 14.8  . Smokeless tobacco: Never Used  Substance Use Topics  . Alcohol use: Yes    Alcohol/week: 2.0 - 4.0 standard drinks    Types: 2 - 4 Glasses of wine per week      Objective:   BP (!) 149/81 (BP Location: Left Arm, Patient Position: Sitting, Cuff Size: Large)   Pulse 85   Temp 97.8 F (36.6 C) (Other (Comment)) Comment (Src): forehead  Resp 16   Wt 216 lb 3.2 oz (98.1 kg)   BMI 33.86 kg/m  Vitals:   02/28/19 0827  BP: (!) 149/81  Pulse: 85  Resp: 16  Temp: 97.8 F (36.6 C)  TempSrc: Other (Comment)  Weight: 216 lb 3.2 oz (98.1 kg)  Body mass index is 33.86 kg/m.   Physical Exam Vitals signs reviewed.  Constitutional:      General: She is not in acute distress.    Appearance: Normal appearance. She is well-developed. She is obese. She is not ill-appearing or diaphoretic.  Neck:     Musculoskeletal: Normal range of motion and neck supple.  Cardiovascular:     Rate and Rhythm: Normal rate and regular rhythm.     Pulses: Normal pulses.     Heart sounds: Normal heart sounds. No murmur. No friction rub. No gallop.   Pulmonary:     Effort: Pulmonary effort  is normal. No respiratory distress.     Breath sounds: Normal breath sounds. No wheezing or rales.  Neurological:     Mental Status: She is alert.     No results found for any visits on 02/28/19.     Assessment & Plan    1. Essential hypertension Add amlodipine as below. Continue Telmisartan and HCTZ. I will see her back in 4 weeks. If tolerating well will prescribe telmisartan-amlodipine combination pill when she returns.  - amLODipine (NORVASC) 5 MG tablet; Take 1 tablet (5 mg total) by mouth daily.  Dispense: 30 tablet; Refill: 0     Mar Daring, PA-C  [facility]

## 2019-02-28 ENCOUNTER — Ambulatory Visit: Payer: BLUE CROSS/BLUE SHIELD | Admitting: Physician Assistant

## 2019-02-28 ENCOUNTER — Encounter: Payer: Self-pay | Admitting: Physician Assistant

## 2019-02-28 ENCOUNTER — Other Ambulatory Visit: Payer: Self-pay

## 2019-02-28 DIAGNOSIS — I10 Essential (primary) hypertension: Secondary | ICD-10-CM | POA: Diagnosis not present

## 2019-02-28 MED ORDER — AMLODIPINE BESYLATE 5 MG PO TABS: 5.0000 mg | ORAL_TABLET | Freq: Every day | ORAL | 0 refills | Status: DC

## 2019-02-28 NOTE — Patient Instructions (Signed)
Amlodipine tablets What is this medicine? AMLODIPINE (am LOE di peen) is a calcium-channel blocker. It affects the amount of calcium found in your heart and muscle cells. This relaxes your blood vessels, which can reduce the amount of work the heart has to do. This medicine is used to lower high blood pressure. It is also used to prevent chest pain. This medicine may be used for other purposes; ask your health care provider or pharmacist if you have questions. COMMON BRAND NAME(S): Norvasc What should I tell my health care provider before I take this medicine? They need to know if you have any of these conditions:  heart disease  liver disease  an unusual or allergic reaction to amlodipine, other medicines, foods, dyes, or preservatives  pregnant or trying to get pregnant  breast-feeding How should I use this medicine? Take this medicine by mouth with a glass of water. Follow the directions on the prescription label. You can take it with or without food. If it upsets your stomach, take it with food. Take your medicine at regular intervals. Do not take it more often than directed. Do not stop taking except on your doctor's advice. Talk to your pediatrician regarding the use of this medicine in children. While this drug may be prescribed for children as young as 6 years for selected conditions, precautions do apply. Patients over 65 years of age may have a stronger reaction and need a smaller dose. Overdosage: If you think you have taken too much of this medicine contact a poison control center or emergency room at once. NOTE: This medicine is only for you. Do not share this medicine with others. What if I miss a dose? If you miss a dose, take it as soon as you can. If it is almost time for your next dose, take only that dose. Do not take double or extra doses. What may interact with this medicine? Do not take this medicine with any of the following medications:  tranylcypromine This  medicine may also interact with the following medications:  clarithromycin  cyclosporine  diltiazem  itraconazole  simvastatin  tacrolimus This list may not describe all possible interactions. Give your health care provider a list of all the medicines, herbs, non-prescription drugs, or dietary supplements you use. Also tell them if you smoke, drink alcohol, or use illegal drugs. Some items may interact with your medicine. What should I watch for while using this medicine? Visit your healthcare professional for regular checks on your progress. Check your blood pressure as directed. Ask your healthcare professional what your blood pressure should be and when you should contact him or her. Do not treat yourself for coughs, colds, or pain while you are using this medicine without asking your healthcare professional for advice. Some medicines may increase your blood pressure. You may get dizzy. Do not drive, use machinery, or do anything that needs mental alertness until you know how this medicine affects you. Do not stand or sit up quickly, especially if you are an older patient. This reduces the risk of dizzy or fainting spells. Avoid alcoholic drinks; they can make you dizzier. What side effects may I notice from receiving this medicine? Side effects that you should report to your doctor or health care professional as soon as possible:  allergic reactions like skin rash, itching or hives; swelling of the face, lips, or tongue  fast, irregular heartbeat  signs and symptoms of low blood pressure like dizziness; feeling faint or lightheaded, falls; unusually weak   or tired  swelling of ankles, feet, hands Side effects that usually do not require medical attention (report these to your doctor or health care professional if they continue or are bothersome):  dry mouth  facial flushing  headache  stomach pain  tiredness This list may not describe all possible side effects. Call your  doctor for medical advice about side effects. You may report side effects to FDA at 1-800-FDA-1088. Where should I keep my medicine? Keep out of the reach of children. Store at room temperature between 59 and 86 degrees F (15 and 30 degrees C). Throw away any unused medicine after the expiration date. NOTE: This sheet is a summary. It may not cover all possible information. If you have questions about this medicine, talk to your doctor, pharmacist, or health care provider.  2020 Elsevier/Gold Standard (2017-11-18 15:07:10)  

## 2019-03-09 DIAGNOSIS — H25812 Combined forms of age-related cataract, left eye: Secondary | ICD-10-CM | POA: Diagnosis not present

## 2019-03-09 DIAGNOSIS — H2512 Age-related nuclear cataract, left eye: Secondary | ICD-10-CM | POA: Diagnosis not present

## 2019-03-22 ENCOUNTER — Other Ambulatory Visit: Payer: Self-pay | Admitting: Physician Assistant

## 2019-03-22 DIAGNOSIS — I10 Essential (primary) hypertension: Secondary | ICD-10-CM

## 2019-03-26 NOTE — Progress Notes (Signed)
Patient: Michaela Hardy Female    DOB: January 12, 1956   63 y.o.   MRN: [id number] Visit Date: 03/27/2019  Today's Provider: Mar Daring, PA-C   Chief Complaint  Patient presents with  . Hypertension   Subjective:     HPI   Hypertension, follow-up:  BP Readings from Last 3 Encounters:  03/27/19 (!) 146/72  02/28/19 (!) 149/81  12/19/18 110/72    She was last seen for hypertension 4 weeks ago.  BP at that visit was 149/81 Management since that visit includes Add amlodipine as below. Continue Telmisartan and HCTZ.  She reports poor compliance with treatment. She is having side effects, headaches Outside blood pressures are not being checked. She is experiencing none.  Patient denies chest pain and lower extremity edema.   Cardiovascular risk factors include dyslipidemia and hypertension.   Weight trend: stable Wt Readings from Last 3 Encounters:  02/28/19 216 lb 3.2 oz (98.1 kg)  12/19/18 211 lb (95.7 kg)  02/22/18 212 lb 9.6 oz (96.4 kg)    Current diet: not asked  ------------------------------------------------------------------------   No Known Allergies   Current Outpatient Medications:  .  calcium carbonate (OS-CAL) 600 MG TABS tablet, Take 1 tablet by mouth daily with breakfast. , Disp: , Rfl:  .  hydrochlorothiazide (HYDRODIURIL) 25 MG tablet, TAKE 1 TABLET BY MOUTH EVERY DAY WITH LOSARTAN, Disp: 90 tablet, Rfl: 1 .  ketoconazole (NIZORAL) 2 % cream, Apply 1 application topically daily., Disp: 60 g, Rfl: 5 .  meloxicam (MOBIC) 15 MG tablet, TAKE 1 TABLET BY MOUTH EVERY DAY, Disp: 90 tablet, Rfl: 1 .  Multiple Vitamin tablet, Take 1 tablet by mouth daily., Disp: , Rfl:  .  omeprazole (PRILOSEC) 20 MG capsule, Take 1 capsule by mouth daily., Disp: , Rfl:  .  simvastatin (ZOCOR) 10 MG tablet, TAKE 1 TABLET (10 MG TOTAL) BY MOUTH DAILY., Disp: 90 tablet, Rfl: 3 .  telmisartan (MICARDIS) 80 MG tablet, Take 1 tablet (80 mg total) by mouth daily.,  Disp: 90 tablet, Rfl: 1 .  ursodiol (ACTIGALL) 300 MG capsule, Take 2 capsules by mouth 2 (two) times daily., Disp: , Rfl:  .  amLODipine (NORVASC) 5 MG tablet, TAKE 1 TABLET BY MOUTH EVERY DAY (Patient not taking: Reported on 03/27/2019), Disp: 90 tablet, Rfl: 1  Review of Systems  Constitutional: Negative.   Respiratory: Negative.   Cardiovascular: Negative.   Gastrointestinal: Negative.   Neurological: Negative.     Social History   Tobacco Use  . Smoking status: Former Smoker    Packs/day: 1.50    Years: 40.00    Pack years: 60.00    Types: Cigarettes    Quit date: 05/10/2004    Years since quitting: 14.8  . Smokeless tobacco: Never Used  Substance Use Topics  . Alcohol use: Yes    Alcohol/week: 2.0 - 4.0 standard drinks    Types: 2 - 4 Glasses of wine per week      Objective:   BP (!) 146/72 (BP Location: Left Arm, Patient Position: Sitting, Cuff Size: Large)   Pulse 86   Temp (!) 96.9 F (36.1 C) (Temporal)  Vitals:   03/27/19 1333  BP: (!) 146/72  Pulse: 86  Temp: (!) 96.9 F (36.1 C)  TempSrc: Temporal  There is no height or weight on file to calculate BMI.   Physical Exam Vitals signs reviewed.  Constitutional:      General: She is not in acute distress.  Appearance: Normal appearance. She is well-developed. She is obese. She is not ill-appearing or diaphoretic.  Neck:     Musculoskeletal: Normal range of motion and neck supple.  Cardiovascular:     Rate and Rhythm: Normal rate and regular rhythm.     Heart sounds: Normal heart sounds. No murmur. No friction rub. No gallop.   Pulmonary:     Effort: Pulmonary effort is normal. No respiratory distress.     Breath sounds: Normal breath sounds. No wheezing or rales.  Neurological:     Mental Status: She is alert.      No results found for any visits on 03/27/19.     Assessment & Plan    1. Essential hypertension Still elevated. Amlodipine caused headache. Continue telmisartan-hctz 80-25mg  as  below. Add Nifedipine 30mg  XR. Return in 2 months for f/u. - NIFEdipine (ADALAT CC) 30 MG 24 hr tablet; Take 1 tablet (30 mg total) by mouth daily.  Dispense: 30 tablet; Refill: 1 - telmisartan-hydrochlorothiazide (MICARDIS HCT) 80-25 MG tablet; Take 1 tablet by mouth daily.  Dispense: 90 tablet; Refill: 1  2. Arthralgia, unspecified joint Stable. Diagnosis pulled for medication refill. Continue current medical treatment plan. - meloxicam (MOBIC) 15 MG tablet; Take 1 tablet (15 mg total) by mouth daily.  Dispense: 90 tablet; Refill: 1  3. Need for shingles vaccine Shingrix #1 Vaccine given to patient without complications. Patient sat for 15 minutes after administration and was tolerated well without adverse effects. Return in 2 months for #2.  - Varicella-zoster vaccine IM (Shingrix)     Mar Daring, PA-C  [place]

## 2019-03-27 ENCOUNTER — Other Ambulatory Visit: Payer: Self-pay

## 2019-03-27 ENCOUNTER — Ambulatory Visit: Payer: BLUE CROSS/BLUE SHIELD | Admitting: Physician Assistant

## 2019-03-27 ENCOUNTER — Encounter: Payer: Self-pay | Admitting: Physician Assistant

## 2019-03-27 VITALS — BP 146/72 | HR 86 | Temp 96.9°F

## 2019-03-27 DIAGNOSIS — I10 Essential (primary) hypertension: Secondary | ICD-10-CM | POA: Diagnosis not present

## 2019-03-27 DIAGNOSIS — Z23 Encounter for immunization: Secondary | ICD-10-CM | POA: Diagnosis not present

## 2019-03-27 DIAGNOSIS — M2550 Pain in unspecified joint: Secondary | ICD-10-CM | POA: Diagnosis not present

## 2019-03-27 MED ORDER — TELMISARTAN-HCTZ 80-25 MG PO TABS: 1.0000 | ORAL_TABLET | Freq: Every day | ORAL | 1 refills | Status: DC

## 2019-03-27 MED ORDER — MELOXICAM 15 MG PO TABS: 15.0000 mg | ORAL_TABLET | Freq: Every day | ORAL | 1 refills | Status: DC

## 2019-03-27 MED ORDER — NIFEDIPINE ER 30 MG PO TB24: 30.0000 mg | ORAL_TABLET | Freq: Every day | ORAL | 1 refills | Status: DC

## 2019-03-27 NOTE — Patient Instructions (Signed)
Nifedipine extended-release tablets What is this medicine? NIFEDIPINE (nye FED i peen) is a calcium-channel blocker. It affects the amount of calcium found in your heart and muscle cells. This relaxes your blood vessels, which can reduce the amount of work the heart has to do. This medicine is used to treat high blood pressure and chest pain caused by angina. This medicine may be used for other purposes; ask your health care provider or pharmacist if you have questions. COMMON BRAND NAME(S): Adalat CC, Afeditab CR, Nifediac CC, Nifedical XL, Procardia XL What should I tell my health care provider before I take this medicine? They need to know if you have any of these conditions:  heart problems, low blood pressure, slow or irregular heartbeat  kidney disease  liver disease  previous heart attack  stomach or intestine problems  an unusual or allergic reaction to nifedipine, other medicines, foods, dyes, or preservatives  pregnant or trying to get pregnant  breast-feeding How should I use this medicine? Take this medicine by mouth with a glass of water. Follow the directions on the prescription label. Do not cut, crush or chew. Take your doses at regular intervals. Do not take your medicine more often then directed. Do not suddenly stop taking this medicine. Your doctor will tell you how much medicine to take. If your doctor wants you to stop the medicine, the dose will be slowly lowered over time to avoid any side effects. Talk to your pediatrician regarding the use of this medicine in children. Special care may be needed. Overdosage: If you think you have taken too much of this medicine contact a poison control center or emergency room at once. NOTE: This medicine is only for you. Do not share this medicine with others. What if I miss a dose? If you miss a dose, take it as soon as you can. If it is almost time for your next dose, take only that dose. Do not take double or extra  doses. What may interact with this medicine? Do not take this medicine with any of the following medications:  certain medicines for seizures like carbamazepine, phenobarbital, phenytoin  lumacaftor; ivacaftor  rifabutin  rifampin  rifapentine  St. John's Wort This medicine may also interact with the following medications:  antiviral medicines for HIV or AIDS  certain medicines for blood pressure  certain medicines for diabetes  certain medicines for erectile dysfunction  certain medicines for fungal infections like ketoconazole, fluconazole, and itraconazole  certain medicines for irregular heart beat like flecainide and quinidine  certain medicines that treat or prevent blood clots like warfarin  clarithromycin  digoxin  dolasetron  erythromycin  fluoxetine  grapefruit juice  local or general anesthetics  nefazodone  orlistat  quinupristin; dalfopristin  sirolimus  stomach acid blockers like cimetidine, ranitidine, omeprazole, or pantoprazole  tacrolimus  valproic acid This list may not describe all possible interactions. Give your health care provider a list of all the medicines, herbs, non-prescription drugs, or dietary supplements you use. Also tell them if you smoke, drink alcohol, or use illegal drugs. Some items may interact with your medicine. What should I watch for while using this medicine? Visit your doctor or health care professional for regular check ups. Check your blood pressure and pulse rate regularly. Ask your doctor or health care professional what your blood pressure and pulse rate should be and when you should contact him or her. You may get drowsy or dizzy. Do not drive, use machinery, or do anything that  needs mental alertness until you know how this medicine affects you. Do not stand or sit up quickly, especially if you are an older patient. This reduces the risk of dizzy or fainting spells. Alcohol may interfere with the effect  of this medicine. Avoid alcoholic drinks. If you are taking Procardia XL, you may notice the empty shell of the tablet in your stool. What side effects may I notice from receiving this medicine? Side effects that you should report to your doctor or health care professional as soon as possible:  blood in the urine  difficulty breathing  fast heartbeat, palpitations, irregular heartbeat, chest pain  redness, blistering, peeling or loosening of the skin, including inside the mouth  reduced amount of urine passed  skin rash  swelling of the legs and ankles Side effects that usually do not require medical attention (report to your doctor or health care professional if they continue or are bothersome):  constipation  facial flushing  headache  weakness or tiredness This list may not describe all possible side effects. Call your doctor for medical advice about side effects. You may report side effects to FDA at 1-800-FDA-1088. Where should I keep my medicine? Keep out of the reach of children. Store at room temperature below 30 degrees C (86 degrees F). Protect from moisture and humidity. Keep container tightly closed. Throw away any unused medicine after the expiration date. NOTE: This sheet is a summary. It may not cover all possible information. If you have questions about this medicine, talk to your doctor, pharmacist, or health care provider.  2020 Elsevier/Gold Standard (2014-07-01 10:25:09)

## 2019-04-18 NOTE — Progress Notes (Signed)
Patient: Michaela Hardy Female    DOB: 08-Aug-1955   63 y.o.   MRN: [id number] Visit Date: 04/19/2019  Today's Provider: Mar Daring, PA-C   Chief Complaint  Patient presents with  . Hypertension   Subjective:     HPI  Follow up for hypertension  The patient was last seen for this 1 months ago. Changes made at last visit include added Nifedipine 30mg  daily.  She reports fair compliance with treatment. Patient stopped after one week due to red face and headaches.  She feels that condition is Unchanged. She is having side effects.  ------------------------------------------------------------------------------------   No Known Allergies   Current Outpatient Medications:  .  calcium carbonate (OS-CAL) 600 MG TABS tablet, Take 1 tablet by mouth daily with breakfast. , Disp: , Rfl:  .  ketoconazole (NIZORAL) 2 % cream, Apply 1 application topically daily., Disp: 60 g, Rfl: 5 .  meloxicam (MOBIC) 15 MG tablet, Take 1 tablet (15 mg total) by mouth daily., Disp: 90 tablet, Rfl: 1 .  Multiple Vitamin tablet, Take 1 tablet by mouth daily., Disp: , Rfl:  .  omeprazole (PRILOSEC) 20 MG capsule, Take 1 capsule by mouth daily., Disp: , Rfl:  .  simvastatin (ZOCOR) 10 MG tablet, TAKE 1 TABLET (10 MG TOTAL) BY MOUTH DAILY., Disp: 90 tablet, Rfl: 3 .  telmisartan-hydrochlorothiazide (MICARDIS HCT) 80-25 MG tablet, Take 1 tablet by mouth daily., Disp: 90 tablet, Rfl: 1 .  ursodiol (ACTIGALL) 300 MG capsule, Take 2 capsules by mouth 2 (two) times daily., Disp: , Rfl:  .  hydrochlorothiazide (HYDRODIURIL) 25 MG tablet, TAKE 1 TABLET BY MOUTH EVERY DAY WITH LOSARTAN (Patient not taking: Reported on 04/19/2019), Disp: 90 tablet, Rfl: 1 .  NIFEdipine (ADALAT CC) 30 MG 24 hr tablet, Take 1 tablet (30 mg total) by mouth daily. (Patient not taking: Reported on 04/19/2019), Disp: 30 tablet, Rfl: 1 .  telmisartan (MICARDIS) 80 MG tablet, Take 1 tablet (80 mg total) by mouth daily.  (Patient not taking: Reported on 04/19/2019), Disp: 90 tablet, Rfl: 1  Review of Systems  Constitutional: Negative.   Eyes: Negative for visual disturbance.  Respiratory: Negative.   Cardiovascular: Negative.   Neurological: Negative.     Social History   Tobacco Use  . Smoking status: Former Smoker    Packs/day: 1.50    Years: 40.00    Pack years: 60.00    Types: Cigarettes    Quit date: 05/10/2004    Years since quitting: 14.9  . Smokeless tobacco: Never Used  Substance Use Topics  . Alcohol use: Yes    Alcohol/week: 2.0 - 4.0 standard drinks    Types: 2 - 4 Glasses of wine per week      Objective:   BP (!) 158/77 (BP Location: Left Arm, Patient Position: Sitting, Cuff Size: Normal)   Pulse 81   Temp (!) 96.9 F (36.1 C) (Temporal)   Wt 216 lb 9.6 oz (98.2 kg)   BMI 33.92 kg/m  Vitals:   04/19/19 1325  BP: (!) 158/77  Pulse: 81  Temp: (!) 96.9 F (36.1 C)  TempSrc: Temporal  Weight: 216 lb 9.6 oz (98.2 kg)  Body mass index is 33.92 kg/m.   Physical Exam Vitals reviewed.  Constitutional:      General: She is not in acute distress.    Appearance: Normal appearance. She is well-developed. She is not ill-appearing.  HENT:     Head: Normocephalic and atraumatic.  Pulmonary:  Effort: Pulmonary effort is normal. No respiratory distress.  Musculoskeletal:     Cervical back: Normal range of motion and neck supple.  Neurological:     Mental Status: She is alert.  Psychiatric:        Mood and Affect: Mood normal.        Behavior: Behavior normal.        Thought Content: Thought content normal.        Judgment: Judgment normal.      No results found for any visits on 04/19/19.     Assessment & Plan    1. Essential hypertension Had side effects to Nifedipine. Stopped after one week. Has continued Telmisartan-HCTZ 80-25mg  daily. Will add Metoprolol XR 50mg  as below. She will start next week. Advised of fatigue side effect and that she could take in the  evening. She voiced understanding. Has appt on 05/25/19 already scheduled. We will f/u HTN then. Call if side effects occur.  - metoprolol succinate (TOPROL-XL) 50 MG 24 hr tablet; Take 1 tablet (50 mg total) by mouth daily. Take with or immediately following a meal.  Dispense: 30 tablet; Refill: [place], PA-C  [facility]

## 2019-04-19 ENCOUNTER — Ambulatory Visit: Payer: BLUE CROSS/BLUE SHIELD | Admitting: Physician Assistant

## 2019-04-19 ENCOUNTER — Encounter: Payer: Self-pay | Admitting: Physician Assistant

## 2019-04-19 ENCOUNTER — Other Ambulatory Visit: Payer: Self-pay | Admitting: Physician Assistant

## 2019-04-19 ENCOUNTER — Other Ambulatory Visit: Payer: Self-pay

## 2019-04-19 VITALS — BP 158/77 | HR 81 | Temp 96.9°F | Wt 216.6 lb

## 2019-04-19 DIAGNOSIS — I10 Essential (primary) hypertension: Secondary | ICD-10-CM

## 2019-04-19 MED ORDER — METOPROLOL SUCCINATE ER 50 MG PO TB24: 50.0000 mg | ORAL_TABLET | Freq: Every day | ORAL | 1 refills | Status: DC

## 2019-04-19 NOTE — Telephone Encounter (Signed)
Requested medication (s) are due for refill today:yes  Requested medication (s) are on the active medication list: yes  Last refill:  03/27/2019  Future visit scheduled: yes  Notes to clinic: Patient has appointment Review for refill   Requested Prescriptions  Pending Prescriptions Disp Refills   NIFEdipine (ADALAT CC) 30 MG 24 hr tablet [Pharmacy Med Name: NIFEDIPINE ER 30 MG TABLET] 30 tablet 1    Sig: TAKE 1 TABLET BY MOUTH EVERY DAY      Cardiovascular:  Calcium Channel Blockers Failed - 04/19/2019 10:34 AM      Failed - Last BP in normal range    BP Readings from Last 1 Encounters:  03/27/19 (!) 146/72          Passed - Valid encounter within last 6 months    Recent Outpatient Visits           3 weeks ago Essential hypertension   [place], Clearnce Sorrel, [place]   1 month ago Essential hypertension   [place], [place], [place]   4 months ago Tinea pedis of both feet   [place], [place], [place]   1 year ago Strep throat   [facility] [place], Clearnce Sorrel, [place]   1 year ago Annual physical exam   [facility] [place], Clearnce Sorrel, [place]       Future Appointments             Today [place], Clearnce Sor[place], PA-C [facility], [place]   In 1 month Burnette, Clearnce Sorrel, PA-C [employer] [employer], [place]

## 2019-04-19 NOTE — Patient Instructions (Addendum)
Metoprolol extended-release tablets What is this medicine? METOPROLOL (me TOE proe lole) is a beta-blocker. Beta-blockers reduce the workload on the heart and help it to beat more regularly. This medicine is used to treat high blood pressure and to prevent chest pain. It is also used to after a heart attack and to prevent an additional heart attack from occurring. This medicine may be used for other purposes; ask your health care provider or pharmacist if you have questions. COMMON BRAND NAME(S): toprol, Toprol XL What should I tell my health care provider before I take this medicine? They need to know if you have any of these conditions:  diabetes  heart or vessel disease like slow heart rate, worsening heart failure, heart block, sick sinus syndrome or Raynaud's disease  kidney disease  liver disease  lung or breathing disease, like asthma or emphysema  pheochromocytoma  thyroid disease  an unusual or allergic reaction to metoprolol, other beta-blockers, medicines, foods, dyes, or preservatives  pregnant or trying to get pregnant  breast-feeding How should I use this medicine? Take this medicine by mouth with a glass of water. Follow the directions on the prescription label. Do not crush or chew. Take this medicine with or immediately after meals. Take your doses at regular intervals. Do not take more medicine than directed. Do not stop taking this medicine suddenly. This could lead to serious heart-related effects. Talk to your pediatrician regarding the use of this medicine in children. While this drug may be prescribed for children as young as 6 years for selected conditions, precautions do apply. Overdosage: If you think you have taken too much of this medicine contact a poison control center or emergency room at once. NOTE: This medicine is only for you. Do not share this medicine with others. What if I miss a dose? If you miss a dose, take it as soon as you can. If it is  almost time for your next dose, take only that dose. Do not take double or extra doses. What may interact with this medicine? This medicine may interact with the following medications:  certain medicines for blood pressure, heart disease, irregular heart beat  certain medicines for depression, like monoamine oxidase (MAO) inhibitors, fluoxetine, or paroxetine  clonidine  dobutamine  epinephrine  isoproterenol  reserpine This list may not describe all possible interactions. Give your health care provider a list of all the medicines, herbs, non-prescription drugs, or dietary supplements you use. Also tell them if you smoke, drink alcohol, or use illegal drugs. Some items may interact with your medicine. What should I watch for while using this medicine? Visit your doctor or health care professional for regular check ups. Contact your doctor right away if your symptoms worsen. Check your blood pressure and pulse rate regularly. Ask your health care professional what your blood pressure and pulse rate should be, and when you should contact them. You may get drowsy or dizzy. Do not drive, use machinery, or do anything that needs mental alertness until you know how this medicine affects you. Do not sit or stand up quickly, especially if you are an older patient. This reduces the risk of dizzy or fainting spells. Contact your doctor if these symptoms continue. Alcohol may interfere with the effect of this medicine. Avoid alcoholic drinks. This medicine may increase blood sugar. Ask your healthcare provider if changes in diet or medicines are needed if you have diabetes. What side effects may I notice from receiving this medicine? Side effects that you  should report to your doctor or health care professional as soon as possible:  allergic reactions like skin rash, itching or hives  cold or numb hands or feet  depression  difficulty breathing  faint  fever with sore throat  irregular  heartbeat, chest pain  rapid weight gain   signs and symptoms of high blood sugar such as being more thirsty or hungry or having to urinate more than normal. You may also feel very tired or have blurry vision.  swollen legs or ankles Side effects that usually do not require medical attention (report to your doctor or health care professional if they continue or are bothersome):  anxiety or nervousness  change in sex drive or performance  dry skin  headache  nightmares or trouble sleeping  short term memory loss  stomach upset or diarrhea This list may not describe all possible side effects. Call your doctor for medical advice about side effects. You may report side effects to FDA at 1-800-FDA-1088. Where should I keep my medicine? Keep out of the reach of children. Store at room temperature between 15 and 30 degrees C (59 and 86 degrees F). Throw away any unused medicine after the expiration date. NOTE: This sheet is a summary. It may not cover all possible information. If you have questions about this medicine, talk to your doctor, pharmacist, or health care provider.  2020 Elsevier/Gold Standard (2018-02-14 11:09:41)

## 2019-05-01 ENCOUNTER — Other Ambulatory Visit: Payer: Self-pay | Admitting: Gastroenterology

## 2019-05-01 DIAGNOSIS — R772 Abnormality of alphafetoprotein: Secondary | ICD-10-CM | POA: Diagnosis not present

## 2019-05-01 DIAGNOSIS — K743 Primary biliary cirrhosis: Secondary | ICD-10-CM | POA: Diagnosis not present

## 2019-05-09 ENCOUNTER — Telehealth: Payer: Self-pay

## 2019-05-09 DIAGNOSIS — I10 Essential (primary) hypertension: Secondary | ICD-10-CM

## 2019-05-09 MED ORDER — BISOPROLOL FUMARATE 10 MG PO TABS: 10.0000 mg | ORAL_TABLET | Freq: Every day | ORAL | 0 refills | Status: DC

## 2019-05-09 NOTE — Addendum Note (Signed)
Addended by: Mar Daring on: 05/09/2019 04:43 PM   Modules accepted: Orders

## 2019-05-09 NOTE — Telephone Encounter (Signed)
Copied from [id number]. Topic: General - Other >> May 09, 2019 11:25 AM Rainey Pines A wrote: Patient has headaches, exhausted all the time from new blood pressure medication.  Patient would like to speak with nurse on what she would further advise .

## 2019-05-09 NOTE — Telephone Encounter (Signed)
Have her stop Metoprolol. Will change to bisoprolol and see if she tolerates this better.

## 2019-05-09 NOTE — Telephone Encounter (Signed)
Patient reports she is having headaches, exhausted and her face is turning red. Patient reports that her BP was 152/74 yesterday and a week ago was 147/72. Please advise.

## 2019-05-09 NOTE — Telephone Encounter (Signed)
Patient advised as below.  

## 2019-05-14 ENCOUNTER — Other Ambulatory Visit: Payer: BLUE CROSS/BLUE SHIELD

## 2019-05-14 ENCOUNTER — Ambulatory Visit: Payer: BLUE CROSS/BLUE SHIELD

## 2019-05-18 ENCOUNTER — Other Ambulatory Visit: Payer: Self-pay

## 2019-05-18 ENCOUNTER — Ambulatory Visit
Admission: RE | Admit: 2019-05-18 | Discharge: 2019-05-18 | Disposition: A | Discharge status: Home / Self Care | Payer: BLUE CROSS/BLUE SHIELD | Source: Ambulatory Visit | Attending: Gastroenterology | Admitting: Gastroenterology

## 2019-05-18 DIAGNOSIS — R772 Abnormality of alphafetoprotein: Secondary | ICD-10-CM

## 2019-05-18 DIAGNOSIS — K760 Fatty (change of) liver, not elsewhere classified: Secondary | ICD-10-CM | POA: Diagnosis not present

## 2019-05-18 DIAGNOSIS — N83201 Unspecified ovarian cyst, right side: Secondary | ICD-10-CM | POA: Diagnosis not present

## 2019-05-18 DIAGNOSIS — K743 Primary biliary cirrhosis: Secondary | ICD-10-CM | POA: Insufficient documentation

## 2019-05-25 ENCOUNTER — Other Ambulatory Visit: Payer: Self-pay

## 2019-05-25 ENCOUNTER — Ambulatory Visit: Payer: BLUE CROSS/BLUE SHIELD | Admitting: Physician Assistant

## 2019-05-25 ENCOUNTER — Encounter: Payer: Self-pay | Admitting: Physician Assistant

## 2019-05-25 VITALS — BP 123/72 | HR 57 | Temp 97.1°F | Resp 18 | Ht 67.0 in | Wt 216.0 lb

## 2019-05-25 DIAGNOSIS — Z23 Encounter for immunization: Secondary | ICD-10-CM

## 2019-05-25 DIAGNOSIS — K769 Liver disease, unspecified: Secondary | ICD-10-CM | POA: Insufficient documentation

## 2019-05-25 DIAGNOSIS — K745 Biliary cirrhosis, unspecified: Secondary | ICD-10-CM | POA: Insufficient documentation

## 2019-05-25 DIAGNOSIS — E7800 Pure hypercholesterolemia, unspecified: Secondary | ICD-10-CM | POA: Insufficient documentation

## 2019-05-25 DIAGNOSIS — I10 Essential (primary) hypertension: Secondary | ICD-10-CM | POA: Diagnosis not present

## 2019-05-25 NOTE — Progress Notes (Signed)
Patient: Michaela Hardy Female    DOB: [birth year]   64 y.o.   MRN: [id number] Visit Date: 05/25/2019  Today's Provider: Mar Daring, PA-C   Chief Complaint  Patient presents with  . Follow-up  . Hypertension  . 2nd Shingrix   Subjective:     HPI    Hypertension, follow-up:  BP Readings from Last 3 Encounters:  05/25/19 123/72  04/19/19 (!) 158/77  03/27/19 (!) 146/72    She was last seen for hypertension 1 months ago.  BP at that visit was 158 77. Management since that visit includes medication.She reports good compliance with treatment. She is having side effects. Headache, dizziness, flushing in the face She is exercising. She is not adherent to low salt diet.   Outside blood pressures are 137/72. She is experiencing none.  Patient denies chest pain, chest pressure/discomfort, claudication, dyspnea, exertional chest pressure/discomfort, fatigue, irregular heart beat, lower extremity edema, near-syncope, orthopnea, palpitations, paroxysmal nocturnal dyspnea, syncope and tachypnea.   Cardiovascular risk factors include hypertension.  Use of agents associated with hypertension: none.      Allergies  Allergen Reactions  . Amlodipine Other (See Comments)    headache  . Nifedipine Other (See Comments)    Flushing and red face; headache     Current Outpatient Medications:  .  calcium carbonate (OS-CAL) 600 MG TABS tablet, Take 1 tablet by mouth daily with breakfast. , Disp: , Rfl:  .  meloxicam (MOBIC) 15 MG tablet, Take 1 tablet (15 mg total) by mouth daily., Disp: 90 tablet, Rfl: 1 .  Multiple Vitamin tablet, Take 1 tablet by mouth daily., Disp: , Rfl:  .  omeprazole (PRILOSEC) 20 MG capsule, Take 1 capsule by mouth daily., Disp: , Rfl:  .  simvastatin (ZOCOR) 10 MG tablet, TAKE 1 TABLET (10 MG TOTAL) BY MOUTH DAILY., Disp: 90 tablet, Rfl: 3 .  telmisartan-hydrochlorothiazide (MICARDIS HCT) 80-25 MG tablet, Take 1 tablet by mouth daily., Disp: 90  tablet, Rfl: 1 .  ursodiol (ACTIGALL) 300 MG capsule, Take 2 capsules by mouth 2 (two) times daily., Disp: , Rfl:  .  bisoprolol (ZEBETA) 10 MG tablet, Take 1 tablet (10 mg total) by mouth daily. (Patient not taking: Reported on 05/25/2019), Disp: 30 tablet, Rfl: 0 .  ketoconazole (NIZORAL) 2 % cream, Apply 1 application topically daily. (Patient not taking: Reported on 05/25/2019), Disp: 60 g, Rfl: 5 .  metoprolol succinate (TOPROL-XL) 50 MG 24 hr tablet, TAKE 1 TABLET (50 MG TOTAL) BY MOUTH DAILY. TAKE WITH OR IMMEDIATELY FOLLOWING A MEAL., Disp: , Rfl:   Review of Systems  Constitutional: Negative.   Eyes: Negative for visual disturbance.  Respiratory: Negative.   Cardiovascular: Negative.   Neurological: Negative.     Social History   Tobacco Use  . Smoking status: Former Smoker    Packs/day: 1.50    Years: 40.00    Pack years: 60.00    Types: Cigarettes    Quit date: 05/10/2004    Years since quitting: 15.0  . Smokeless tobacco: Never Used  Substance Use Topics  . Alcohol use: Yes    Alcohol/week: 2.0 - 4.0 standard drinks    Types: 2 - 4 Glasses of wine per week      Objective:   BP 123/72   Pulse (!) 57   Temp (!) 97.1 F (36.2 C) (Temporal)   Resp 18   Ht 5\' 7"  (1.702 m)   Wt 216 lb (98 kg)  BMI 33.83 kg/m  Vitals:   05/25/19 1518  BP: 123/72  Pulse: (!) 57  Resp: 18  Temp: (!) 97.1 F (36.2 C)  TempSrc: Temporal  Weight: 216 lb (98 kg)  Height: 5\' 7"  (1.702 m)  Body mass index is 33.83 kg/m.   Physical Exam Vitals reviewed.  Constitutional:      General: She is not in acute distress.    Appearance: Normal appearance. She is well-developed. She is obese. She is not ill-appearing.  HENT:     Head: Normocephalic and atraumatic.  Pulmonary:     Effort: Pulmonary effort is normal. No respiratory distress.  Musculoskeletal:     Cervical back: Normal range of motion and neck supple.  Neurological:     Mental Status: She is alert.  Psychiatric:         Mood and Affect: Mood normal.        Behavior: Behavior normal.        Thought Content: Thought content normal.        Judgment: Judgment normal.      No results found for any visits on 05/25/19.     Assessment & Plan    1. Essential hypertension Improved. Continue Telmisartan-HCTZ 80-25mg  and metoprolol XR 50mg .   2. Need for shingles vaccine Shingrix #2 Vaccine given to patient without complications. Patient sat for 15 minutes after administration and was tolerated well without adverse effects. - Varicella-zoster vaccine IM (Shingrix)     Mar Daring, PA-C  [facility]

## 2019-05-28 ENCOUNTER — Encounter: Payer: Self-pay | Admitting: Physician Assistant

## 2019-05-28 MED ORDER — SIMVASTATIN 10 MG PO TABS: 10.0000 mg | ORAL_TABLET | Freq: Every day | ORAL | 1 refills | Status: DC

## 2019-05-31 ENCOUNTER — Encounter: Payer: Self-pay | Admitting: Physician Assistant

## 2019-06-03 ENCOUNTER — Other Ambulatory Visit: Payer: Self-pay | Admitting: Physician Assistant

## 2019-06-03 DIAGNOSIS — I10 Essential (primary) hypertension: Secondary | ICD-10-CM

## 2019-06-15 DIAGNOSIS — Z01419 Encounter for gynecological examination (general) (routine) without abnormal findings: Secondary | ICD-10-CM | POA: Diagnosis not present

## 2019-06-15 DIAGNOSIS — Z124 Encounter for screening for malignant neoplasm of cervix: Secondary | ICD-10-CM | POA: Diagnosis not present

## 2019-06-15 DIAGNOSIS — E669 Obesity, unspecified: Secondary | ICD-10-CM | POA: Diagnosis not present

## 2019-06-15 DIAGNOSIS — Z1389 Encounter for screening for other disorder: Secondary | ICD-10-CM | POA: Diagnosis not present

## 2019-06-15 DIAGNOSIS — Z130 Encounter for screening for diseases of the blood and blood-forming organs and certain disorders involving the immune mechanism: Secondary | ICD-10-CM | POA: Diagnosis not present

## 2019-08-17 ENCOUNTER — Encounter: Payer: BLUE CROSS/BLUE SHIELD | Admitting: Physician Assistant

## 2019-08-27 ENCOUNTER — Telehealth: Payer: Self-pay

## 2019-08-27 DIAGNOSIS — I10 Essential (primary) hypertension: Secondary | ICD-10-CM

## 2019-08-27 DIAGNOSIS — R739 Hyperglycemia, unspecified: Secondary | ICD-10-CM

## 2019-08-27 DIAGNOSIS — E7800 Pure hypercholesterolemia, unspecified: Secondary | ICD-10-CM

## 2019-08-27 DIAGNOSIS — K745 Biliary cirrhosis, unspecified: Secondary | ICD-10-CM

## 2019-08-27 DIAGNOSIS — K743 Primary biliary cirrhosis: Secondary | ICD-10-CM

## 2019-08-27 NOTE — Telephone Encounter (Signed)
Copied from [place] [id number]. Topic: General - Other >> Aug 27, 2019 11:15 AM Oneta Rack wrote: Reason for CRM: patient scheduled for CPE on Friday 4/23 at 2pm, patient would like pre visit lab orders place prior, patient unable to fast all day. Please advise patient when orders have been placed

## 2019-08-27 NOTE — Telephone Encounter (Signed)
Labs ordered.

## 2019-08-27 NOTE — Addendum Note (Signed)
Addended by: Mar Daring on: 08/27/2019 06:57 PM   Modules accepted: Orders

## 2019-08-28 NOTE — Telephone Encounter (Signed)
Patient advised. Lab order placed up front.  

## 2019-08-28 NOTE — Progress Notes (Signed)
Complete physical exam    Patient: Michaela Hardy   DOB: [id number]   64 y.o. Female  MRN: [id number] Visit Date: 08/31/2019  Today's healthcare provider: Mar Daring, PA-C  Subjective:    Chief Complaint  Patient presents with  . Annual Exam    Michaela Hardy is a 64 y.o. female who presents today for a complete physical exam.  She reports consuming a general diet. Home exercise routine includes treadmill. She generally feels well. She reports sleeping well. She does not have additional problems to discuss today.  HPI  Followed by GYN-gets her mammogram at [facility]  Past Medical History:  Diagnosis Date  . Anxiety   . Depression   . GERD (gastroesophageal reflux disease)   . Hepatitis    Primary bilary cholangitis  . Hyperlipidemia   . Hypertension   . PONV (postoperative nausea and vomiting)    Past Surgical History:  Procedure Laterality Date  . APPENDECTOMY  1976  . BREAST BIOPSY Left 10/03/2014   Dr. Autumn Patty  . BREAST EXCISIONAL BIOPSY Left   . BREAST LUMPECTOMY WITH RADIOACTIVE SEED LOCALIZATION Left 11/21/2014   Procedure: LEFT BREAST LUMPECTOMY WITH RADIOACTIVE SEED LOCALIZATION;  Surgeon: Autumn Messing III, MD;  Location: [facility];  Service: General;  Laterality: Left;  . CERVICAL BIOPSY  W/ LOOP ELECTRODE EXCISION    . CHOLECYSTECTOMY  06/29/2007  . LIVER BIOPSY  2008  . TUBAL LIGATION  1987   Social History   Socioeconomic History  . Marital status: Married    Spouse name: Juanda Crumble  . Number of children: 2  . Years of education: 63  . Highest education level: Some college, no degree  Occupational History  . 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 Smoking status: Former Smoker    Packs/day: 1.50    Years: 40.00    Pack years: 60.00    Types: Cigarettes    Quit date: 05/10/2004    Years since quitting: 15.3  . Smokeless tobacco: Never Used  Substance and Sexual Activity  .  Alcohol use: Yes    Alcohol/week: 2.0 - 4.0 standard drinks    Types: 2 - 4 Glasses of wine per week  . Drug use: No  . Sexual activity: Not Currently  Other Topics Concern  . Not on file  Social History Narrative  . Not on file   Social Determinants of Health   Financial Resource Strain:   . Difficulty of Paying Living Expenses:   Food Insecurity:   . Worried About Charity fundraiser in the Last Year:   . Arboriculturist in the Last Year:   Transportation Needs:   . Film/video editor (Medical):   Marland Kitchen Lack of Transportation (Non-Medical):   Physical Activity:   . Days of Exercise per Week:   . Minutes of Exercise per Session:   Stress:   . Feeling of Stress :   Social Connections:   . Frequency of Communication with Friends and Family:   . Frequency of Social Gatherings with Friends and Family:   . Attends Religious Services:   . Active Member of Clubs or Organizations:   . Attends Archivist Meetings:   Marland Kitchen Marital Status:   Intimate Partner Violence:   . Fear of Current or Ex-Partner:   . Emotionally Abused:   Marland Kitchen Physically Abused:   . Sexually Abused:    Family Status  Relation Name Status  . Mother  Alive  . Father  Deceased at age 39       Stroke  . Brother  Alive  . MGM  (Not Specified)  . Neg Hx  (Not Specified)   Family History  Problem Relation Age of Onset  . Hypertension Mother   . Pancreatitis Mother   . Thyroid disease Mother   . Hypertension Father   . Heart attack Father   . Cerebrovascular Accident Father   . Coronary artery disease Father   . Glaucoma Father   . Cancer Father        lung  . Parkinson's disease Maternal Grandmother   . Breast cancer Neg Hx   . Colon cancer Neg Hx   . Ovarian cancer Neg Hx    Allergies  Allergen Reactions  . Amlodipine Other (See Comments)    headache  . Nifedipine Other (See Comments)    Flushing and red face; headache    Patient Care Team: Mar Daring, PA-C as PCP - General  (Family Medicine)   Medications: Outpatient Medications Prior to Visit  Medication Sig  . calcium carbonate (OS-CAL) 600 MG TABS tablet Take 1 tablet by mouth daily with breakfast.   . ketoconazole (NIZORAL) 2 % cream Apply 1 application topically daily.  . meloxicam (MOBIC) 15 MG tablet Take 1 tablet (15 mg total) by mouth daily.  . Multiple Vitamin tablet Take 1 tablet by mouth daily.  Marland Kitchen omeprazole (PRILOSEC) 20 MG capsule Take 1 capsule by mouth daily.  . simvastatin (ZOCOR) 10 MG tablet Take 1 tablet (10 mg total) by mouth daily.  Marland Kitchen telmisartan-hydrochlorothiazide (MICARDIS HCT) 80-25 MG tablet Take 1 tablet by mouth daily.  . ursodiol (ACTIGALL) 300 MG capsule Take 2 capsules by mouth 2 (two) times daily.  . metoprolol succinate (TOPROL-XL) 50 MG 24 hr tablet TAKE 1 TABLET (50 MG TOTAL) BY MOUTH DAILY. TAKE WITH OR IMMEDIATELY FOLLOWING A MEAL.   No facility-administered medications prior to visit.    Review of Systems  Constitutional: Negative.   HENT: Negative.   Eyes: Negative.   Respiratory: Negative.   Cardiovascular: Negative.   Gastrointestinal: Negative.   Endocrine: Negative.   Genitourinary: Negative.   Musculoskeletal: Negative.   Skin: Negative.   Allergic/Immunologic: Negative.   Neurological: Negative.   Hematological: Negative.   Psychiatric/Behavioral: Negative.     Last CBC Lab Results  Component Value Date   WBC 5.2 08/30/2019   HGB 12.0 08/30/2019   HCT 36.7 08/30/2019   MCV 91 08/30/2019   MCH 29.6 08/30/2019   RDW 13.4 08/30/2019   PLT 330 0000000   Last metabolic panel Lab Results  Component Value Date   GLUCOSE 113 (H) 08/30/2019   NA 139 08/30/2019   K 4.3 08/30/2019   CL 100 08/30/2019   CO2 24 08/30/2019   BUN 13 08/30/2019   CREATININE 0.67 08/30/2019   GFRNONAA 94 08/30/2019   GFRAA 108 08/30/2019   CALCIUM 9.5 08/30/2019   PROT 6.9 08/30/2019   ALBUMIN 4.2 08/30/2019   LABGLOB 2.7 08/30/2019   AGRATIO 1.6 08/30/2019    BILITOT 0.3 08/30/2019   ALKPHOS 155 (H) 08/30/2019   AST 31 08/30/2019   ALT 38 (H) 08/30/2019   ANIONGAP 10 11/18/2014   Last lipids Lab Results  Component Value Date   CHOL 206 (H) 08/30/2019   HDL 61 08/30/2019   LDLCALC 120 (H) 08/30/2019   TRIG 142 08/30/2019   CHOLHDL 3.3 12/07/2017  Last hemoglobin A1c Lab Results  Component Value Date   HGBA1C 6.1 (H) 08/30/2019        Objective:    BP 140/67 (BP Location: Left Arm, Patient Position: Sitting, Cuff Size: Large)   Pulse 78   Temp (!) 97 F (36.1 C) (Temporal)   Resp 16   Ht 5\' 7"  (1.702 m)   Wt 216 lb 9.6 oz (98.2 kg)   BMI 33.92 kg/m  BP Readings from Last 3 Encounters:  08/31/19 140/67  05/25/19 123/72  04/19/19 (!) 158/77   Wt Readings from Last 3 Encounters:  08/31/19 216 lb 9.6 oz (98.2 kg)  05/25/19 216 lb (98 kg)  04/19/19 216 lb 9.6 oz (98.2 kg)      Physical Exam Vitals reviewed.  Constitutional:      General: She is not in acute distress.    Appearance: Normal appearance. She is well-developed. She is obese. She is not ill-appearing or diaphoretic.  HENT:     Head: Normocephalic and atraumatic.     Right Ear: Tympanic membrane, ear canal and external ear normal.     Left Ear: Tympanic membrane, ear canal and external ear normal.  Eyes:     General: No scleral icterus.       Right eye: No discharge.        Left eye: No discharge.     Extraocular Movements: Extraocular movements intact.     Conjunctiva/sclera: Conjunctivae normal.     Pupils: Pupils are equal, round, and reactive to light.  Neck:     Thyroid: No thyromegaly.     Vascular: No carotid bruit or JVD.     Trachea: No tracheal deviation.  Cardiovascular:     Rate and Rhythm: Normal rate and regular rhythm.     Pulses: Normal pulses.     Heart sounds: Normal heart sounds. No murmur. No friction rub. No gallop.   Pulmonary:     Effort: Pulmonary effort is normal. No respiratory distress.     Breath sounds: Normal breath  sounds. No wheezing or rales.  Chest:     Chest wall: No tenderness.  Abdominal:     General: Bowel sounds are normal. There is no distension.     Palpations: Abdomen is soft. There is no mass.     Tenderness: There is no abdominal tenderness. There is no guarding or rebound.  Musculoskeletal:        General: No tenderness. Normal range of motion.     Cervical back: Normal range of motion and neck supple.     Right lower leg: No edema.     Left lower leg: No edema.  Lymphadenopathy:     Cervical: No cervical adenopathy.  Skin:    General: Skin is warm and dry.     Capillary Refill: Capillary refill takes less than 2 seconds.     Findings: No rash.  Neurological:     General: No focal deficit present.     Mental Status: She is alert and oriented to person, place, and time. Mental status is at baseline.  Psychiatric:        Mood and Affect: Mood normal.        Behavior: Behavior normal.        Thought Content: Thought content normal.        Judgment: Judgment normal.       Depression Screen  PHQ 2/9 Scores 12/06/2017 11/30/2016 10/20/2015  PHQ - 2 Score 0 0 0  PHQ- 9  Score - 0 -    No results found for any visits on 08/31/19.    Assessment & Plan:    Routine Health Maintenance and Physical Exam  Exercise Activities and Dietary recommendations Goals    . Exercise 150 minutes per week (moderate activity)       Immunization History  Administered Date(s) Administered  . Hepatitis A 11/27/2008, 05/30/2009  . Hepatitis B 11/27/2008, 12/26/2008, 05/30/2009  . Hepatitis B, adult 01/22/2016  . Influenza Inj Mdck Quad Pf 02/26/2017  . Influenza,inj,Quad PF,6+ Mos 02/07/2019  . Influenza-Unspecified 01/31/2018  . Td 12/03/1998  . Tdap 06/29/2012, 06/04/2016  . Zoster Recombinat (Shingrix) 03/27/2019, 05/25/2019    Health Maintenance  Topic Date Due  . HIV Screening  Never done  . COVID-19 Vaccine (1) Never done  . COLONOSCOPY  05/30/2017  . PAP SMEAR-Modifier   09/23/2017  . INFLUENZA VACCINE  12/09/2019  . MAMMOGRAM  12/07/2020  . TETANUS/TDAP  06/04/2026  . Hepatitis C Screening  Completed    Discussed health benefits of physical activity, and encouraged her to engage in regular exercise appropriate for her age and condition.  1. Annual physical exam Normal exam. Labs normal. Up to date on screenings and vaccinations.   2. Essential hypertension Stable. Diagnosis pulled for medication refill. Continue current medical treatment plan. - telmisartan-hydrochlorothiazide (MICARDIS HCT) 80-25 MG tablet; Take 1 tablet by mouth daily.  Dispense: 90 tablet; Refill: 3    No follow-ups on file.     Reynolds Bowl, PA-C, have reviewed all documentation for this visit. The documentation on 08/31/19 for the exam, diagnosis, procedures, and orders are all accurate and complete.   Rubye Beach  [facility] [phone] (phone) [phone] (fax)  [place]

## 2019-08-30 DIAGNOSIS — K743 Primary biliary cirrhosis: Secondary | ICD-10-CM | POA: Diagnosis not present

## 2019-08-30 DIAGNOSIS — R739 Hyperglycemia, unspecified: Secondary | ICD-10-CM | POA: Diagnosis not present

## 2019-08-30 DIAGNOSIS — E7800 Pure hypercholesterolemia, unspecified: Secondary | ICD-10-CM | POA: Diagnosis not present

## 2019-08-30 DIAGNOSIS — I10 Essential (primary) hypertension: Secondary | ICD-10-CM | POA: Diagnosis not present

## 2019-08-30 DIAGNOSIS — K745 Biliary cirrhosis, unspecified: Secondary | ICD-10-CM | POA: Diagnosis not present

## 2019-08-31 ENCOUNTER — Encounter: Payer: Self-pay | Admitting: Physician Assistant

## 2019-08-31 ENCOUNTER — Other Ambulatory Visit: Payer: Self-pay

## 2019-08-31 ENCOUNTER — Ambulatory Visit (INDEPENDENT_AMBULATORY_CARE_PROVIDER_SITE_OTHER): Payer: BLUE CROSS/BLUE SHIELD | Admitting: Physician Assistant

## 2019-08-31 VITALS — BP 140/67 | HR 78 | Temp 97.0°F | Resp 16 | Ht 67.0 in | Wt 216.6 lb

## 2019-08-31 DIAGNOSIS — I10 Essential (primary) hypertension: Secondary | ICD-10-CM | POA: Diagnosis not present

## 2019-08-31 DIAGNOSIS — Z0000 Encounter for general adult medical examination without abnormal findings: Secondary | ICD-10-CM

## 2019-08-31 LAB — CBC WITH DIFFERENTIAL/PLATELET
Basophils Absolute: 0.1 10*3/uL (ref 0.0–0.2)
Basos: 1 %
EOS (ABSOLUTE): 0.1 10*3/uL (ref 0.0–0.4)
Eos: 2 %
Hematocrit: 36.7 % (ref 34.0–46.6)
Hemoglobin: 12 g/dL (ref 11.1–15.9)
Immature Grans (Abs): 0 10*3/uL (ref 0.0–0.1)
Immature Granulocytes: 0 %
Lymphocytes Absolute: 1.4 10*3/uL (ref 0.7–3.1)
Lymphs: 26 %
MCH: 29.6 pg (ref 26.6–33.0)
MCHC: 32.7 g/dL (ref 31.5–35.7)
MCV: 91 fL (ref 79–97)
Monocytes Absolute: 0.6 10*3/uL (ref 0.1–0.9)
Monocytes: 12 %
Neutrophils Absolute: 3.1 10*3/uL (ref 1.4–7.0)
Neutrophils: 59 %
Platelets: 330 10*3/uL (ref 150–450)
RBC: 4.05 x10E6/uL (ref 3.77–5.28)
RDW: 13.4 % (ref 11.7–15.4)
WBC: 5.2 10*3/uL (ref 3.4–10.8)

## 2019-08-31 LAB — HEMOGLOBIN A1C
Est. average glucose Bld gHb Est-mCnc: 128 mg/dL
Hgb A1c MFr Bld: 6.1 % — ABNORMAL HIGH (ref 4.8–5.6)

## 2019-08-31 LAB — COMPREHENSIVE METABOLIC PANEL
ALT: 38 IU/L — ABNORMAL HIGH (ref 0–32)
AST: 31 IU/L (ref 0–40)
Albumin/Globulin Ratio: 1.6 (ref 1.2–2.2)
Albumin: 4.2 g/dL (ref 3.8–4.8)
Alkaline Phosphatase: 155 IU/L — ABNORMAL HIGH (ref 39–117)
BUN/Creatinine Ratio: 19 (ref 12–28)
BUN: 13 mg/dL (ref 8–27)
Bilirubin Total: 0.3 mg/dL (ref 0.0–1.2)
CO2: 24 mmol/L (ref 20–29)
Calcium: 9.5 mg/dL (ref 8.7–10.3)
Chloride: 100 mmol/L (ref 96–106)
Creatinine, Ser: 0.67 mg/dL (ref 0.57–1.00)
GFR calc Af Amer: 108 mL/min/{1.73_m2} (ref >59)
GFR calc non Af Amer: 94 mL/min/{1.73_m2} (ref >59)
Globulin, Total: 2.7 g/dL (ref 1.5–4.5)
Glucose: 113 mg/dL — ABNORMAL HIGH (ref 65–99)
Potassium: 4.3 mmol/L (ref 3.5–5.2)
Sodium: 139 mmol/L (ref 134–144)
Total Protein: 6.9 g/dL (ref 6.0–8.5)

## 2019-08-31 LAB — LIPID PANEL WITH LDL/HDL RATIO
Cholesterol, Total: 206 mg/dL — ABNORMAL HIGH (ref 100–199)
HDL: 61 mg/dL (ref >39)
LDL Chol Calc (NIH): 120 mg/dL — ABNORMAL HIGH (ref 0–99)
LDL/HDL Ratio: 2 ratio (ref 0.0–3.2)
Triglycerides: 142 mg/dL (ref 0–149)
VLDL Cholesterol Cal: 25 mg/dL (ref 5–40)

## 2019-08-31 LAB — TSH: TSH: 4.24 u[IU]/mL (ref 0.450–4.500)

## 2019-08-31 LAB — VITAMIN D 25 HYDROXY (VIT D DEFICIENCY, FRACTURES): Vit D, 25-Hydroxy: 39 ng/mL (ref 30.0–100.0)

## 2019-08-31 MED ORDER — TELMISARTAN-HCTZ 80-25 MG PO TABS: 1.0000 | ORAL_TABLET | Freq: Every day | ORAL | 3 refills | Status: DC

## 2019-08-31 NOTE — Patient Instructions (Signed)

## 2019-09-24 ENCOUNTER — Other Ambulatory Visit: Payer: Self-pay | Admitting: Physician Assistant

## 2019-09-24 DIAGNOSIS — I10 Essential (primary) hypertension: Secondary | ICD-10-CM

## 2019-09-24 MED ORDER — TELMISARTAN-HCTZ 80-25 MG PO TABS: 1.0000 | ORAL_TABLET | Freq: Every day | ORAL | 3 refills | Status: DC

## 2019-09-24 NOTE — Telephone Encounter (Signed)
Changes in pharmacy- Rx forwarded to mail order pharmacy

## 2019-09-24 NOTE — Telephone Encounter (Signed)
Medication Refill - Medication: telmisartan-hydrochlorothiazide.please advise  Has the patient contacted their pharmacy? Yes.   (Agent: If no, request that the patient contact the pharmacy for the refill.) (Agent: If yes, when and what did the pharmacy advise?)  Preferred Pharmacy (with phone number or street name): [facility]/mail order Contact # [phone]  Agent: Please be advised that RX refills may take up to 3 business days. We ask that you follow-up with your pharmacy.

## 2019-10-19 ENCOUNTER — Other Ambulatory Visit: Payer: Self-pay | Admitting: Physician Assistant

## 2019-10-19 DIAGNOSIS — M2550 Pain in unspecified joint: Secondary | ICD-10-CM

## 2019-11-13 ENCOUNTER — Other Ambulatory Visit: Payer: Self-pay | Admitting: Obstetrics and Gynecology

## 2019-11-13 DIAGNOSIS — Z1231 Encounter for screening mammogram for malignant neoplasm of breast: Secondary | ICD-10-CM

## 2019-11-23 DIAGNOSIS — N83201 Unspecified ovarian cyst, right side: Secondary | ICD-10-CM | POA: Diagnosis not present

## 2019-11-25 ENCOUNTER — Other Ambulatory Visit: Payer: Self-pay | Admitting: Physician Assistant

## 2019-11-25 NOTE — Telephone Encounter (Signed)
Requested Prescriptions  Pending Prescriptions Disp Refills   simvastatin (ZOCOR) 10 MG tablet [Pharmacy Med Name: SIMVASTATIN TAB 10MG ] 90 tablet 3    Sig: TAKE 1 TABLET BY MOUTH EVERY DAY     Cardiovascular:  Antilipid - Statins Failed - 11/25/2019 11:07 AM      Failed - Total Cholesterol in normal range and within 360 days    Cholesterol, Total  Date Value Ref Range Status  08/30/2019 206 (H) 100 - 199 mg/dL Final         Failed - LDL in normal range and within 360 days    LDL Chol Calc (NIH)  Date Value Ref Range Status  08/30/2019 120 (H) 0 - 99 mg/dL Final         Passed - HDL in normal range and within 360 days    HDL  Date Value Ref Range Status  08/30/2019 61 >39 mg/dL Final         Passed - Triglycerides in normal range and within 360 days    Triglycerides  Date Value Ref Range Status  08/30/2019 142 0 - 149 mg/dL Final         Passed - Patient is not pregnant      Passed - Valid encounter within last 12 months    Recent Outpatient Visits          2 months ago Annual physical exam   [place], Clearnce Sorrel, PA-C   6 months ago Essential hypertension   [place], [place], [place]   7 months ago Essential hypertension   [place], [place], [place]   8 months ago Essential hypertension   [employer] [place], [place]   9 months ago Essential hypertension   [place], [place], [place]

## 2019-12-14 ENCOUNTER — Other Ambulatory Visit: Payer: Self-pay

## 2019-12-14 ENCOUNTER — Ambulatory Visit
Admission: RE | Admit: 2019-12-14 | Discharge: 2019-12-14 | Disposition: A | Discharge status: Home / Self Care | Payer: BLUE CROSS/BLUE SHIELD | Source: Ambulatory Visit | Attending: Obstetrics and Gynecology | Admitting: Obstetrics and Gynecology

## 2019-12-14 DIAGNOSIS — Z1231 Encounter for screening mammogram for malignant neoplasm of breast: Secondary | ICD-10-CM

## 2020-03-05 ENCOUNTER — Ambulatory Visit (INDEPENDENT_AMBULATORY_CARE_PROVIDER_SITE_OTHER): Payer: BLUE CROSS/BLUE SHIELD

## 2020-03-05 ENCOUNTER — Other Ambulatory Visit: Payer: Self-pay

## 2020-03-05 DIAGNOSIS — Z23 Encounter for immunization: Secondary | ICD-10-CM

## 2020-03-11 DIAGNOSIS — K743 Primary biliary cirrhosis: Secondary | ICD-10-CM | POA: Diagnosis not present

## 2020-03-12 ENCOUNTER — Other Ambulatory Visit: Payer: Self-pay | Admitting: Gastroenterology

## 2020-03-12 DIAGNOSIS — K743 Primary biliary cirrhosis: Secondary | ICD-10-CM

## 2020-03-18 DIAGNOSIS — N83201 Unspecified ovarian cyst, right side: Secondary | ICD-10-CM | POA: Diagnosis not present

## 2020-03-24 ENCOUNTER — Ambulatory Visit
Admission: RE | Admit: 2020-03-24 | Discharge: 2020-03-24 | Disposition: A | Discharge status: Home / Self Care | Payer: BLUE CROSS/BLUE SHIELD | Source: Ambulatory Visit | Attending: Gastroenterology | Admitting: Gastroenterology

## 2020-03-24 ENCOUNTER — Other Ambulatory Visit: Payer: Self-pay

## 2020-03-24 DIAGNOSIS — K760 Fatty (change of) liver, not elsewhere classified: Secondary | ICD-10-CM | POA: Diagnosis not present

## 2020-03-24 DIAGNOSIS — K743 Primary biliary cirrhosis: Secondary | ICD-10-CM | POA: Diagnosis not present

## 2020-04-09 ENCOUNTER — Ambulatory Visit: Payer: BLUE CROSS/BLUE SHIELD

## 2020-04-30 ENCOUNTER — Other Ambulatory Visit: Payer: Self-pay

## 2020-04-30 ENCOUNTER — Ambulatory Visit (INDEPENDENT_AMBULATORY_CARE_PROVIDER_SITE_OTHER): Payer: BLUE CROSS/BLUE SHIELD

## 2020-04-30 DIAGNOSIS — Z23 Encounter for immunization: Secondary | ICD-10-CM

## 2020-05-28 DIAGNOSIS — K743 Primary biliary cirrhosis: Secondary | ICD-10-CM | POA: Diagnosis not present

## 2020-06-27 DIAGNOSIS — Z01419 Encounter for gynecological examination (general) (routine) without abnormal findings: Secondary | ICD-10-CM | POA: Diagnosis not present

## 2020-06-27 DIAGNOSIS — Z6834 Body mass index (BMI) 34.0-34.9, adult: Secondary | ICD-10-CM | POA: Diagnosis not present

## 2020-06-27 DIAGNOSIS — Z1389 Encounter for screening for other disorder: Secondary | ICD-10-CM | POA: Diagnosis not present

## 2020-06-27 DIAGNOSIS — Z130 Encounter for screening for diseases of the blood and blood-forming organs and certain disorders involving the immune mechanism: Secondary | ICD-10-CM | POA: Diagnosis not present

## 2020-07-02 ENCOUNTER — Ambulatory Visit: Payer: BLUE CROSS/BLUE SHIELD | Admitting: Physician Assistant

## 2020-07-02 ENCOUNTER — Other Ambulatory Visit: Payer: Self-pay

## 2020-07-02 ENCOUNTER — Encounter: Payer: Self-pay | Admitting: Physician Assistant

## 2020-07-02 VITALS — BP 138/82 | HR 69 | Temp 98.1°F | Wt 215.0 lb

## 2020-07-02 DIAGNOSIS — G8929 Other chronic pain: Secondary | ICD-10-CM

## 2020-07-02 DIAGNOSIS — M25562 Pain in left knee: Secondary | ICD-10-CM

## 2020-07-02 MED ORDER — METHYLPREDNISOLONE 4 MG PO TBPK: ORAL_TABLET | ORAL | 0 refills | Status: DC

## 2020-07-02 NOTE — Patient Instructions (Signed)
Journal for Nurse Practitioners, 15(4), 263-267. Retrieved February 13, 2018 from http://clinicalkey.com/nursing">  Knee Exercises Ask your health care provider which exercises are safe for you. Do exercises exactly as told by your health care provider and adjust them as directed. It is normal to feel mild stretching, pulling, tightness, or discomfort as you do these exercises. Stop right away if you feel sudden pain or your pain gets worse. Do not begin these exercises until told by your health care provider. Stretching and range-of-motion exercises These exercises warm up your muscles and joints and improve the movement and flexibility of your knee. These exercises also help to relieve pain and swelling. Knee extension, prone 1. Lie on your abdomen (prone position) on a bed. 2. Place your left / right knee just beyond the edge of the surface so your knee is not on the bed. You can put a towel under your left / right thigh just above your kneecap for comfort. 3. Relax your leg muscles and allow gravity to straighten your knee (extension). You should feel a stretch behind your left / right knee. 4. Hold this position for __________ seconds. 5. Scoot up so your knee is supported between repetitions. Repeat __________ times. Complete this exercise __________ times a day. Knee flexion, active 1. Lie on your back with both legs straight. If this causes back discomfort, bend your left / right knee so your foot is flat on the floor. 2. Slowly slide your left / right heel back toward your buttocks. Stop when you feel a gentle stretch in the front of your knee or thigh (flexion). 3. Hold this position for __________ seconds. 4. Slowly slide your left / right heel back to the starting position. Repeat __________ times. Complete this exercise __________ times a day.   Quadriceps stretch, prone 1. Lie on your abdomen on a firm surface, such as a bed or padded floor. 2. Bend your left / right knee and hold  your ankle. If you cannot reach your ankle or pant leg, loop a belt around your foot and grab the belt instead. 3. Gently pull your heel toward your buttocks. Your knee should not slide out to the side. You should feel a stretch in the front of your thigh and knee (quadriceps). 4. Hold this position for __________ seconds. Repeat __________ times. Complete this exercise __________ times a day.   Hamstring, supine 1. Lie on your back (supine position). 2. Loop a belt or towel over the ball of your left / right foot. The ball of your foot is on the walking surface, right under your toes. 3. Straighten your left / right knee and slowly pull on the belt to raise your leg until you feel a gentle stretch behind your knee (hamstring). ? Do not let your knee bend while you do this. ? Keep your other leg flat on the floor. 4. Hold this position for __________ seconds. Repeat __________ times. Complete this exercise __________ times a day. Strengthening exercises These exercises build strength and endurance in your knee. Endurance is the ability to use your muscles for a long time, even after they get tired. Quadriceps, isometric This exercise stretches the muscles in front of your thigh (quadriceps) without moving your knee joint (isometric). 1. Lie on your back with your left / right leg extended and your other knee bent. Put a rolled towel or small pillow under your knee if told by your health care provider. 2. Slowly tense the muscles in the front of your   left / right thigh. You should see your kneecap slide up toward your hip or see increased dimpling just above the knee. This motion will push the back of the knee toward the floor. 3. For __________ seconds, hold the muscle as tight as you can without increasing your pain. 4. Relax the muscles slowly and completely. Repeat __________ times. Complete this exercise __________ times a day.   Straight leg raises This exercise stretches the muscles in  front of your thigh (quadriceps) and the muscles that move your hips (hip flexors). 1. Lie on your back with your left / right leg extended and your other knee bent. 2. Tense the muscles in the front of your left / right thigh. You should see your kneecap slide up or see increased dimpling just above the knee. Your thigh may even shake a bit. 3. Keep these muscles tight as you raise your leg 4-6 inches (10-15 cm) off the floor. Do not let your knee bend. 4. Hold this position for __________ seconds. 5. Keep these muscles tense as you lower your leg. 6. Relax your muscles slowly and completely after each repetition. Repeat __________ times. Complete this exercise __________ times a day. Hamstring, isometric 1. Lie on your back on a firm surface. 2. Bend your left / right knee about __________ degrees. 3. Dig your left / right heel into the surface as if you are trying to pull it toward your buttocks. Tighten the muscles in the back of your thighs (hamstring) to "dig" as hard as you can without increasing any pain. 4. Hold this position for __________ seconds. 5. Release the tension gradually and allow your muscles to relax completely for __________ seconds after each repetition. Repeat __________ times. Complete this exercise __________ times a day. Hamstring curls If told by your health care provider, do this exercise while wearing ankle weights. Begin with __________ lb weights. Then increase the weight by 1 lb (0.5 kg) increments. Do not wear ankle weights that are more than __________ lb. 1. Lie on your abdomen with your legs straight. 2. Bend your left / right knee as far as you can without feeling pain. Keep your hips flat against the floor. 3. Hold this position for __________ seconds. 4. Slowly lower your leg to the starting position. Repeat __________ times. Complete this exercise __________ times a day.   Squats This exercise strengthens the muscles in front of your thigh and knee  (quadriceps). 1. Stand in front of a table, with your feet and knees pointing straight ahead. You may rest your hands on the table for balance but not for support. 2. Slowly bend your knees and lower your hips like you are going to sit in a chair. ? Keep your weight over your heels, not over your toes. ? Keep your lower legs upright so they are parallel with the table legs. ? Do not let your hips go lower than your knees. ? Do not bend lower than told by your health care provider. ? If your knee pain increases, do not bend as low. 3. Hold the squat position for __________ seconds. 4. Slowly push with your legs to return to standing. Do not use your hands to pull yourself to standing. Repeat __________ times. Complete this exercise __________ times a day. Wall slides This exercise strengthens the muscles in front of your thigh and knee (quadriceps). 1. Lean your back against a smooth wall or door, and walk your feet out 18-24 inches (46-61 cm) from it. 2.   Place your feet hip-width apart. 3. Slowly slide down the wall or door until your knees bend __________ degrees. Keep your knees over your heels, not over your toes. Keep your knees in line with your hips. 4. Hold this position for __________ seconds. Repeat __________ times. Complete this exercise __________ times a day.   Straight leg raises This exercise strengthens the muscles that rotate the leg at the hip and move it away from your body (hip abductors). 1. Lie on your side with your left / right leg in the top position. Lie so your head, shoulder, knee, and hip line up. You may bend your bottom knee to help you keep your balance. 2. Roll your hips slightly forward so your hips are stacked directly over each other and your left / right knee is facing forward. 3. Leading with your heel, lift your top leg 4-6 inches (10-15 cm). You should feel the muscles in your outer hip lifting. ? Do not let your foot drift forward. ? Do not let your  knee roll toward the ceiling. 4. Hold this position for __________ seconds. 5. Slowly return your leg to the starting position. 6. Let your muscles relax completely after each repetition. Repeat __________ times. Complete this exercise __________ times a day.   Straight leg raises This exercise stretches the muscles that move your hips away from the front of the pelvis (hip extensors). 1. Lie on your abdomen on a firm surface. You can put a pillow under your hips if that is more comfortable. 2. Tense the muscles in your buttocks and lift your left / right leg about 4-6 inches (10-15 cm). Keep your knee straight as you lift your leg. 3. Hold this position for __________ seconds. 4. Slowly lower your leg to the starting position. 5. Let your leg relax completely after each repetition. Repeat __________ times. Complete this exercise __________ times a day. This information is not intended to replace advice given to you by your health care provider. Make sure you discuss any questions you have with your health care provider. Document Revised: 02/14/2018 Document Reviewed: 02/14/2018 Elsevier Patient Education  2021 [employer] Inc.  

## 2020-07-02 NOTE — Progress Notes (Signed)
Established patient visit   Patient: Michaela Hardy   DOB: [birth year]   65 y.o. Female  MRN: [id number] Visit Date: 07/02/2020  Today's healthcare provider: Mar Daring, PA-C   Chief Complaint  Patient presents with  . Leg Pain   Subjective    Leg Pain  Incident onset: Started several months ago. There was no injury mechanism. The pain is present in the left leg. The quality of the pain is described as aching. Pertinent negatives include no inability to bear weight, loss of motion, loss of sensation, muscle weakness, numbness or tingling. Exacerbated by: Being still. She has tried nothing for the symptoms.     Patient Active Problem List   Diagnosis Date Noted  . Disease of liver 05/25/2019  . Biliary cirrhosis ([place]) 05/25/2019  . Elevated blood sugar 10/20/2015  . Allergic sinusitis 01/14/2015  . Eustachian tube dysfunction 01/14/2015  . Primary biliary cholangitis ([place]) 11/15/2014  . Other migraine, not intractable, without status migrainosus 08/10/2014  . Depressive disorder 08/10/2014  . Acid reflux 08/10/2014  . Other specified bacterial intestinal infections 08/10/2014  . Hypercholesteremia 08/10/2014  . Hypertensive disorder 08/10/2014  . Insomnia 08/10/2014  . Gastro-esophageal reflux disease without esophagitis 08/10/2014  . Major depressive disorder, single episode 08/10/2014   Past Medical History:  Diagnosis Date  . Anxiety   . Depression   . GERD (gastroesophageal reflux disease)   . Hepatitis    Primary bilary cholangitis  . Hyperlipidemia   . Hypertension   . PONV (postoperative nausea and vomiting)    Social History   Tobacco Use  . Smoking status: Former Smoker    Packs/day: 1.50    Years: 40.00    Pack years: 60.00    Types: Cigarettes    Quit date: 05/10/2004    Years since quitting: 16.1  . Smokeless tobacco: Never Used  Vaping Use  . Vaping Use: Never used  Substance Use Topics  . Alcohol use: Yes    Alcohol/week: 2.0 -  4.0 standard drinks    Types: 2 - 4 Glasses of wine per week  . Drug use: No   Allergies  Allergen Reactions  . Amlodipine Other (See Comments)    headache  . Nifedipine Other (See Comments)    Flushing and red face; headache     Medications: Outpatient Medications Prior to Visit  Medication Sig  . calcium carbonate (OS-CAL) 600 MG TABS tablet Take 1 tablet by mouth daily with breakfast.   . ketoconazole (NIZORAL) 2 % cream Apply 1 application topically daily.  . meloxicam (MOBIC) 15 MG tablet TAKE 1 TABLET (15 MG TOTAL) BY MOUTH DAILY.  . Multiple Vitamin tablet Take 1 tablet by mouth daily.  Marland Kitchen omeprazole (PRILOSEC) 20 MG capsule Take 1 capsule by mouth daily.  . simvastatin (ZOCOR) 10 MG tablet TAKE 1 TABLET BY MOUTH EVERY DAY  . telmisartan-hydrochlorothiazide (MICARDIS HCT) 80-25 MG tablet Take 1 tablet by mouth daily.  . ursodiol (ACTIGALL) 300 MG capsule Take 2 capsules by mouth 2 (two) times daily.  . metoprolol succinate (TOPROL-XL) 50 MG 24 hr tablet TAKE 1 TABLET (50 MG TOTAL) BY MOUTH DAILY. TAKE WITH OR IMMEDIATELY FOLLOWING A MEAL.   No facility-administered medications prior to visit.    Review of Systems  Constitutional: Negative.   Musculoskeletal: Positive for arthralgias and myalgias. Negative for back pain, gait problem, joint swelling, neck pain and neck stiffness.  Neurological: Negative for tingling and numbness.  Objective    There were no vitals taken for this visit.   Physical Exam Vitals reviewed.  Constitutional:      General: She is not in acute distress.    Appearance: Normal appearance. She is well-developed and well-nourished. She is obese. She is not ill-appearing.  HENT:     Head: Normocephalic and atraumatic.  Eyes:     Extraocular Movements: EOM normal.  Pulmonary:     Effort: Pulmonary effort is normal. No respiratory distress.  Musculoskeletal:     Cervical back: Normal range of motion and neck supple.     Left hip: No  tenderness or bony tenderness. Normal range of motion. Normal strength.     Right knee: Normal.     Left knee: Swelling present. No effusion, bony tenderness or crepitus. Normal range of motion. Tenderness present over the patellar tendon. No medial joint line, lateral joint line, MCL, LCL, ACL or PCL tenderness. No LCL laxity, MCL laxity, ACL laxity or PCL laxity.Normal alignment, normal meniscus and normal patellar mobility. Normal pulse.     Instability Tests: Anterior drawer test negative. Posterior drawer test negative. Anterior Lachman test negative. Medial McMurray test negative and lateral McMurray test negative.     Right lower leg: Normal.     Left lower leg: Normal. No swelling or tenderness. No edema.  Neurological:     Mental Status: She is alert.  Psychiatric:        Mood and Affect: Mood and affect normal.        Behavior: Behavior normal.        Thought Content: Thought content normal.        Judgment: Judgment normal.     No results found for any visits on 07/02/20.  Assessment & Plan     1. Chronic pain of left knee Suspect OA and possible baker cyst. Pain is mostly in posterior knee. Will get xrays as below to r/o OA. Medrol given for inflammation. Hold meloxicam while on medrol. May restart meloxicam once medrol completed. Call if worsening.  - DG Knee Complete 4 Views Left; Future - methylPREDNISolone (MEDROL) 4 MG TBPK tablet; 6 day taper; take as directed instructions  Dispense: 21 tablet; Refill: 0   No follow-ups on file.      Reynolds Bowl, PA-C, have reviewed all documentation for this visit. The documentation on 07/02/20 for the exam, diagnosis, procedures, and orders are all accurate and complete.   Rubye Beach  [facility] [phone] (phone) [phone] (fax)  [place]

## 2020-07-03 ENCOUNTER — Ambulatory Visit
Admission: RE | Admit: 2020-07-03 | Discharge: 2020-07-03 | Disposition: A | Discharge status: Home / Self Care | Payer: BLUE CROSS/BLUE SHIELD | Source: Ambulatory Visit | Attending: Physician Assistant | Admitting: Physician Assistant

## 2020-07-03 ENCOUNTER — Ambulatory Visit
Admission: RE | Admit: 2020-07-03 | Discharge: 2020-07-03 | Disposition: A | Discharge status: Home / Self Care | Payer: BLUE CROSS/BLUE SHIELD | Attending: Physician Assistant | Admitting: Physician Assistant

## 2020-07-03 DIAGNOSIS — M25562 Pain in left knee: Secondary | ICD-10-CM | POA: Insufficient documentation

## 2020-07-03 DIAGNOSIS — G8929 Other chronic pain: Secondary | ICD-10-CM | POA: Diagnosis not present

## 2020-07-03 DIAGNOSIS — M1712 Unilateral primary osteoarthritis, left knee: Secondary | ICD-10-CM | POA: Diagnosis not present

## 2020-07-06 ENCOUNTER — Other Ambulatory Visit: Payer: Self-pay | Admitting: Physician Assistant

## 2020-07-06 DIAGNOSIS — M2550 Pain in unspecified joint: Secondary | ICD-10-CM

## 2020-07-06 NOTE — Telephone Encounter (Signed)
Requested Prescriptions  Pending Prescriptions Disp Refills  . meloxicam (MOBIC) 15 MG tablet [Pharmacy Med Name: MELOXICAM TAB 15MG ] 90 tablet 1    Sig: TAKE 1 TABLET BY MOUTH EVERY DAY     Analgesics:  COX2 Inhibitors Passed - 07/06/2020 12:04 PM      Passed - HGB in normal range and within 360 days    Hemoglobin  Date Value Ref Range Status  08/30/2019 12.0 11.1 - 15.9 g/dL Final         Passed - Cr in normal range and within 360 days    Creatinine, Ser  Date Value Ref Range Status  08/30/2019 0.67 0.57 - 1.00 mg/dL Final         Passed - Patient is not pregnant      Passed - Valid encounter within last 12 months    Recent Outpatient Visits          4 days ago Chronic pain of left knee   [facility] [place] M[place] M, PA-C   10 months ago Annual physical exam   [facility] [place], Clearnce Sorrel, [place]   1 year ago Essential hypertension   [facility], Clearnce Sorrel, [place]   1 year ago Essential hypertension   [place], Clearnce Sorrel, [place]   1 year ago Essential hypertension   [place], [place], [place]

## 2020-07-11 IMAGING — US US ABDOMEN LIMITED W/ ELASTOGRAPHY
1 series · 13 of 25 positions shown · non-contrast
Comparison: MRI 02/25/2018

CLINICAL DATA: Primary biliary cholangitis.  Fatty liver.

EXAM:
US ABDOMEN LIMITED - RIGHT UPPER QUADRANT
ULTRASOUND HEPATIC ELASTOGRAPHY
TECHNIQUE: Limited right upper quadrant abdominal ultrasound was performed. In
addition, ultrasound elastography evaluation of the liver was
performed. A region of interest was placed in the right lobe of the
liver. Following application of a compressive sonographic pulse,
shear waves were detected in the adjacent hepatic tissue and the
shear wave velocity was calculated. Multiple assessments were
performed at the selected site. Median shear wave velocity is
correlated to a Metavir fibrosis score.

[Series 1: us abdomen limited w/ elastography · 13 of 46 slices shown]
[im 1/46]
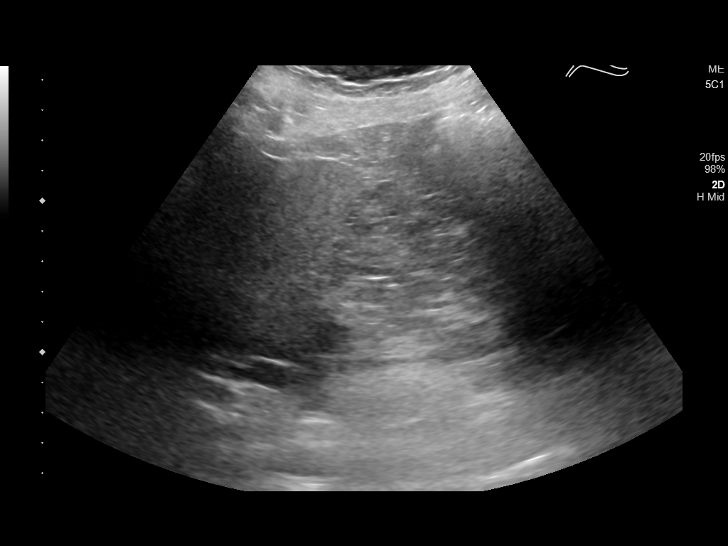
[im 4/46]
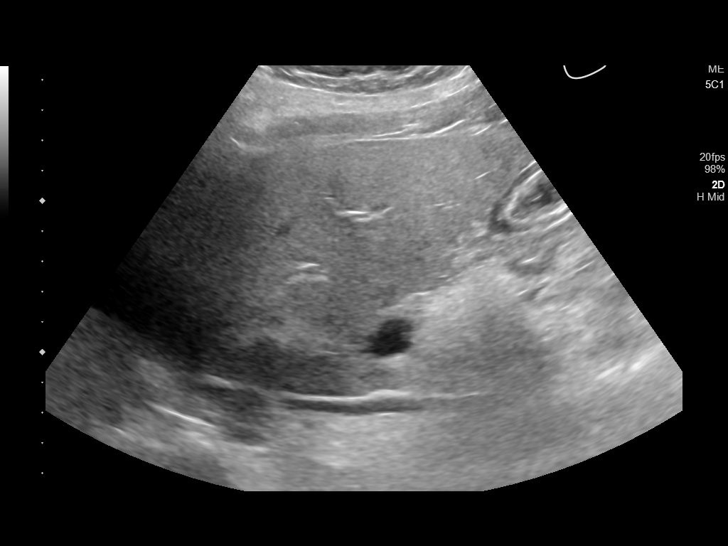
[im 8/46]
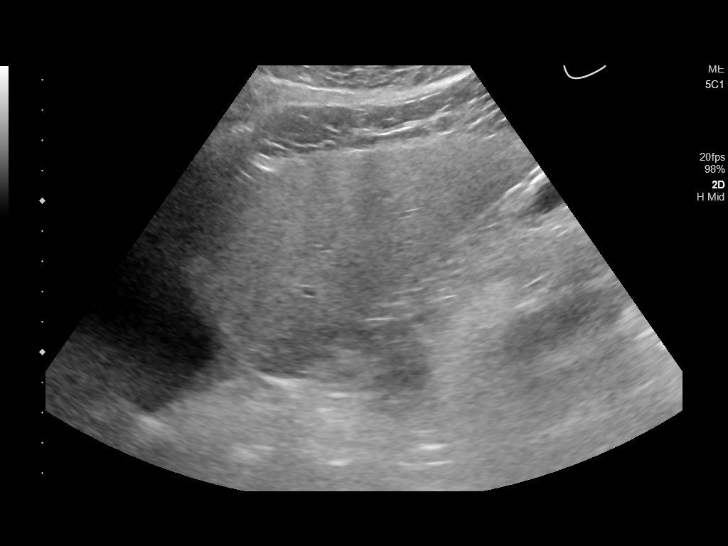
[im 12/46]
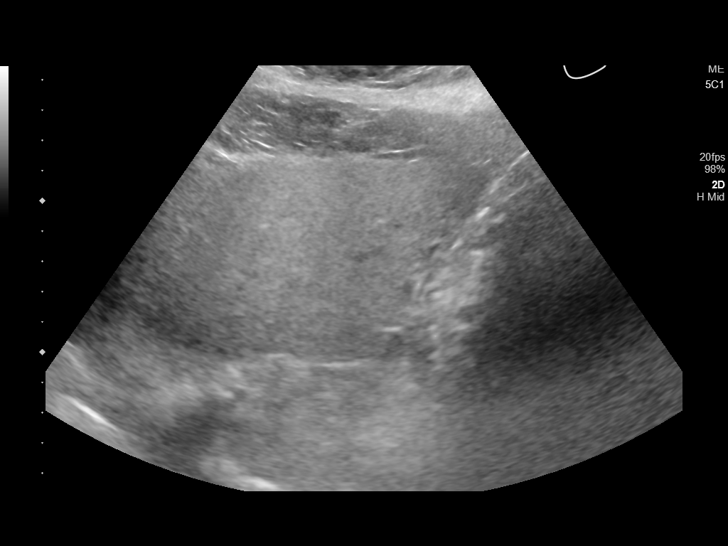
[im 16/46]
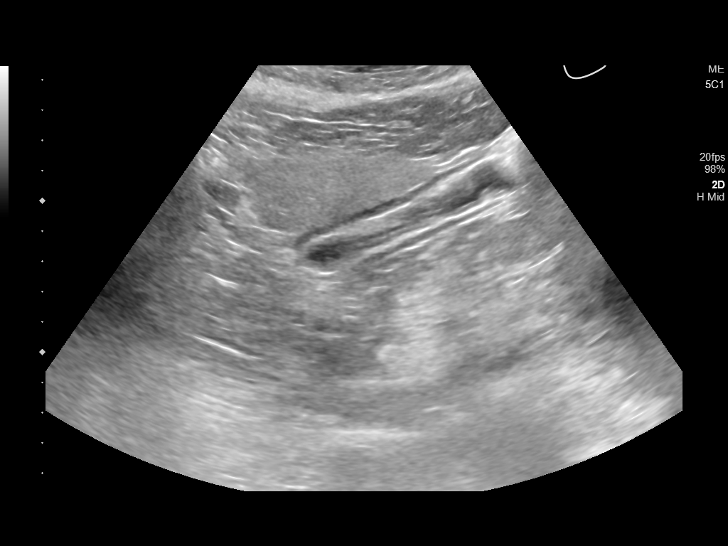
[im 19/46]
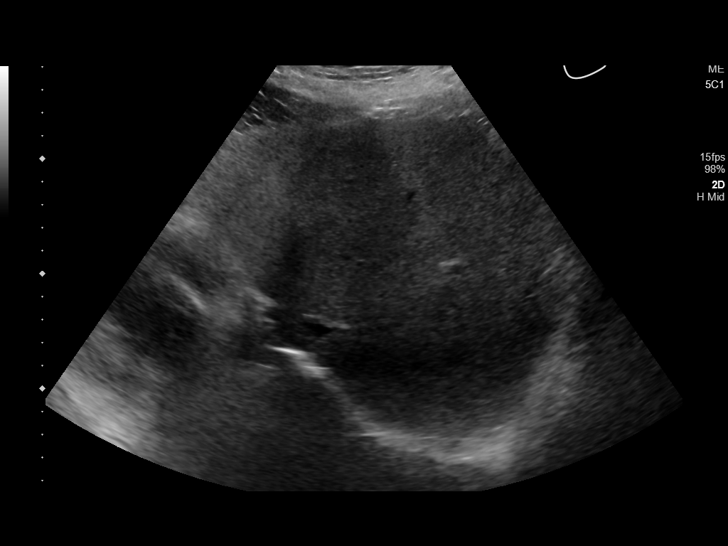
[im 23/46]
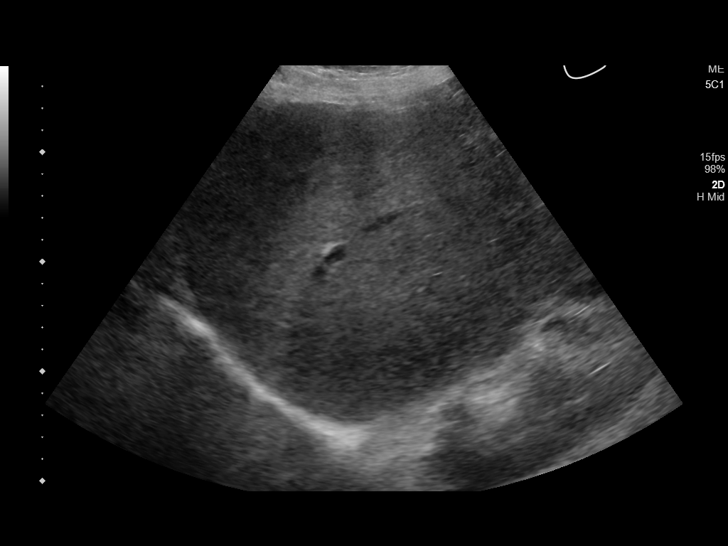
[im 27/46]
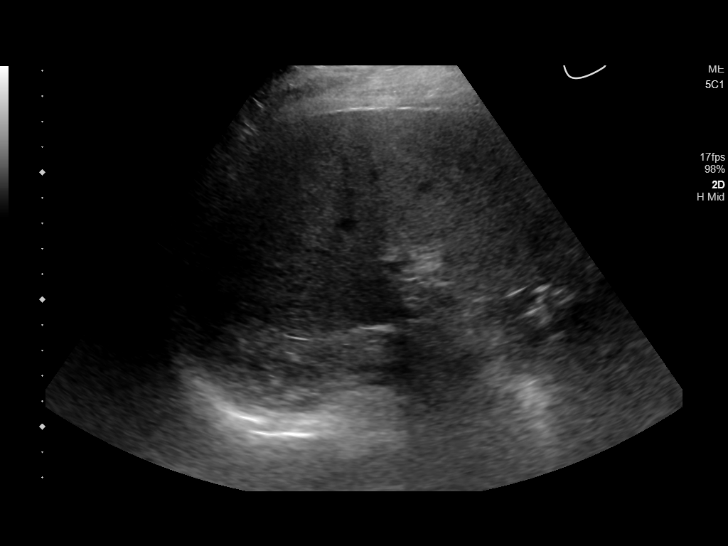
[im 31/46]
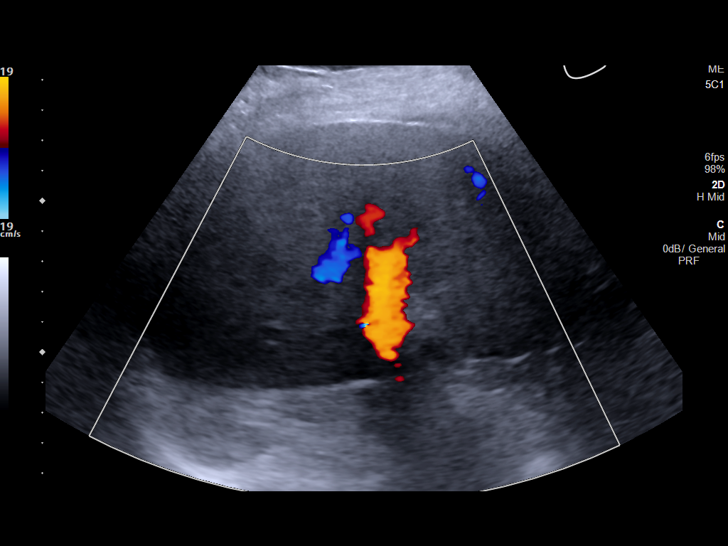
[im 34/46]
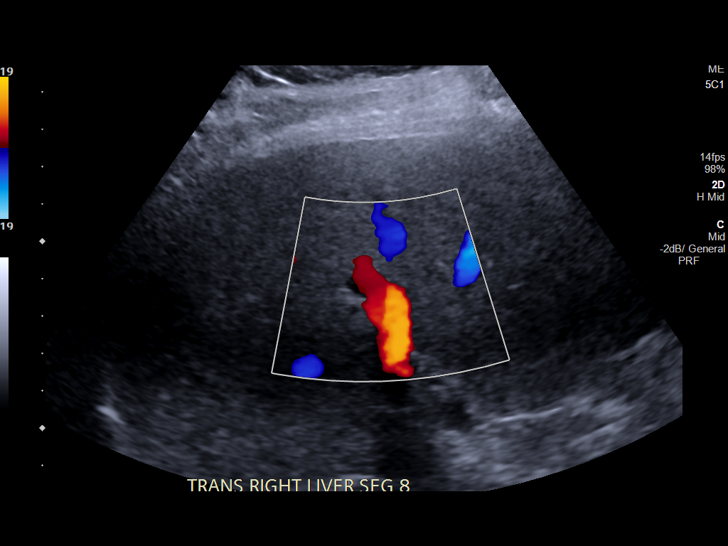
[im 38/46]
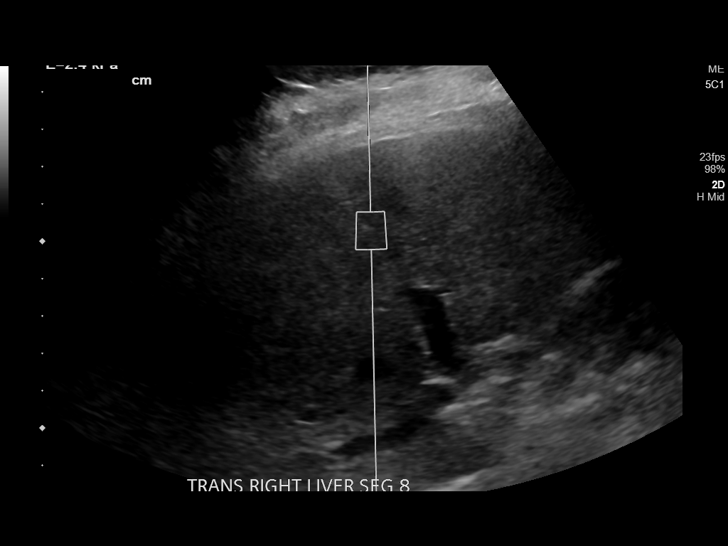
[im 42/46]
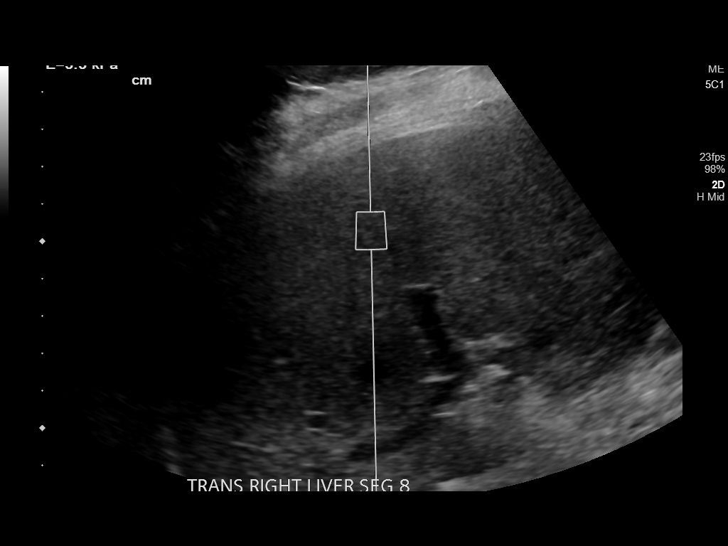
[im 46/46]
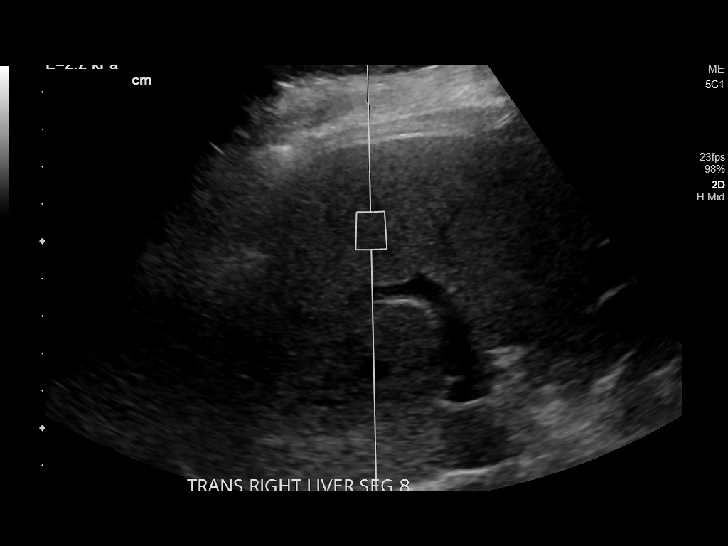

[13 of 25 positions shown; findings below may reference images not displayed]

FINDINGS: ULTRASOUND ABDOMEN LIMITED RIGHT UPPER QUADRANT

Gallbladder:

Prior cholecystectomy

Common bile duct:

Diameter: Normal caliber, 6 mm

Liver:

Increased echotexture compatible with fatty infiltration. No focal
abnormality or biliary ductal dilatation. Portal vein is patent on
color Doppler imaging with normal direction of blood flow towards
the liver.

ULTRASOUND HEPATIC ELASTOGRAPHY

Device: Siemens Helix VTQ

Patient position: Supine

Transducer [id number]

Number of measurements: 10

Hepatic segment:  8

Median velocity:   0.9 m/sec

IQR:

IQR/Median velocity ratio:

Corresponding Metavir fibrosis score:  F0/F1

Risk of fibrosis: Minimal

Limitations of exam: None

Please note that abnormal shear wave velocities may also be
identified in clinical settings other than with hepatic fibrosis,
such as: acute hepatitis, elevated right heart and central venous
pressures including use of beta blockers, Edneudo disease
([place]), infiltrative processes such as
mastocytosis/amyloidosis/infiltrative tumor, extrahepatic
cholestasis, in the post-prandial state, and liver transplantation.
Correlation with patient history, laboratory data, and clinical
condition recommended.
IMPRESSION: ULTRASOUND ABDOMEN: Fatty infiltration of the liver.

ULTRASOUND HEPATIC ELASTOGRAHY:

Median hepatic shear wave velocity is calculated at 0.9 m/sec.

Corresponding Metavir fibrosis score is F0/F1.

Risk of fibrosis is Minimal.

Follow-up: None required

## 2020-07-11 IMAGING — US US HEPATIC LIVER DOPPLER
1 series · 14 of 14 positions shown · non-contrast
Comparison: 02/25/2018

CLINICAL DATA: Primary biliary cholangitis, hepatic steatosis

EXAM:
DUPLEX ULTRASOUND OF LIVER
TECHNIQUE: Color and duplex Doppler ultrasound was performed to evaluate the
hepatic in-flow and out-flow vessels.

[Series 1: us hepatic liver doppler · 14 of 14 slices shown]
[im 1/14]
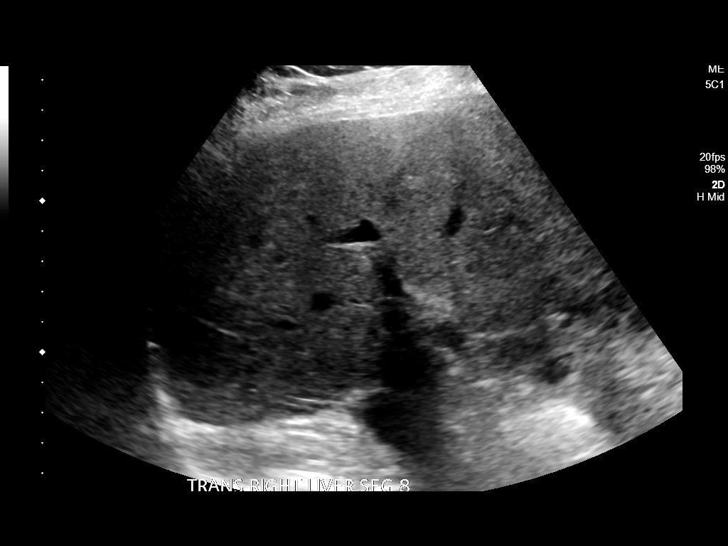
[im 2/14]
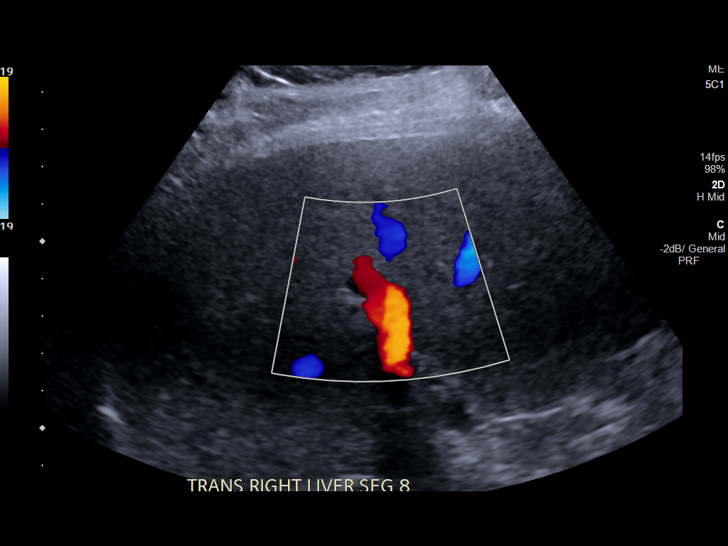
[im 3/14]
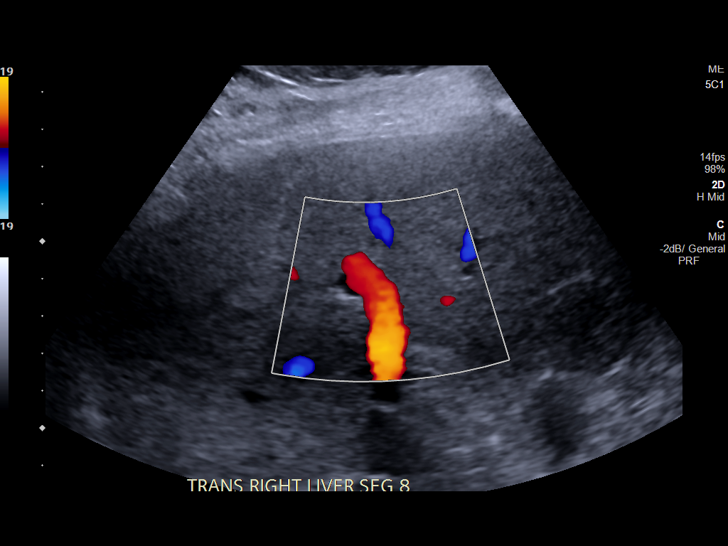
[im 4/14]
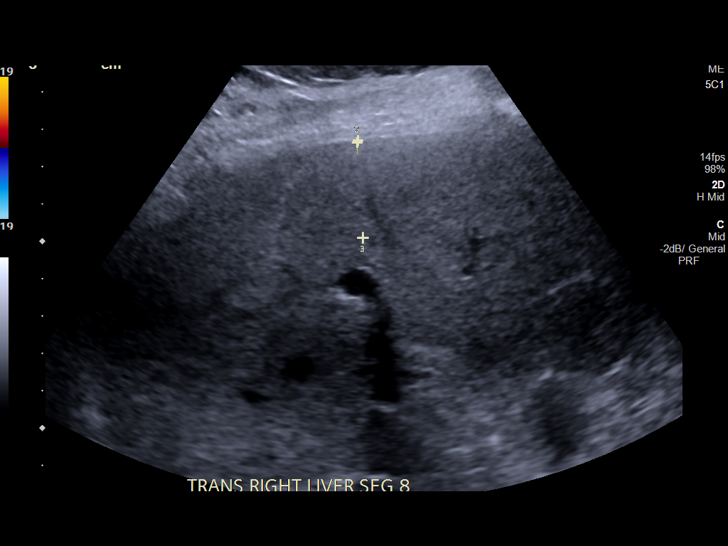
[im 5/14]
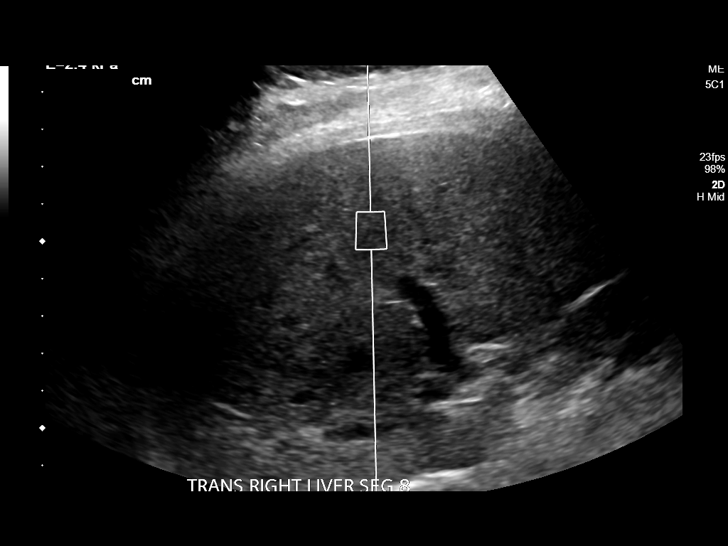
[im 6/14]
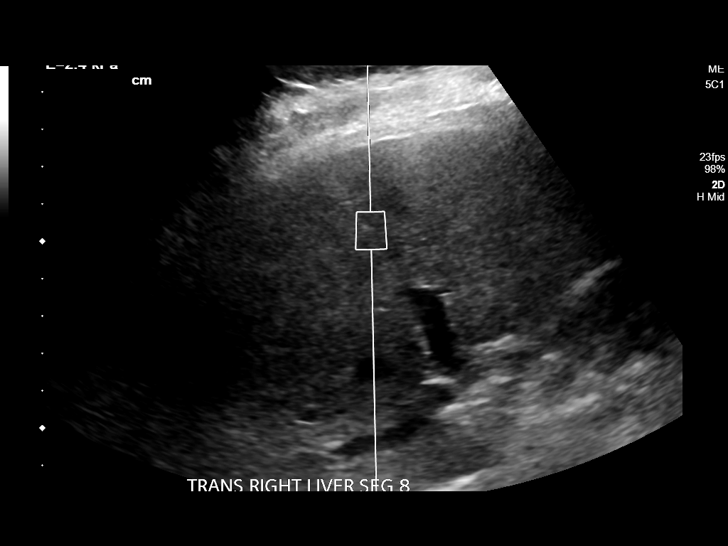
[im 7/14]
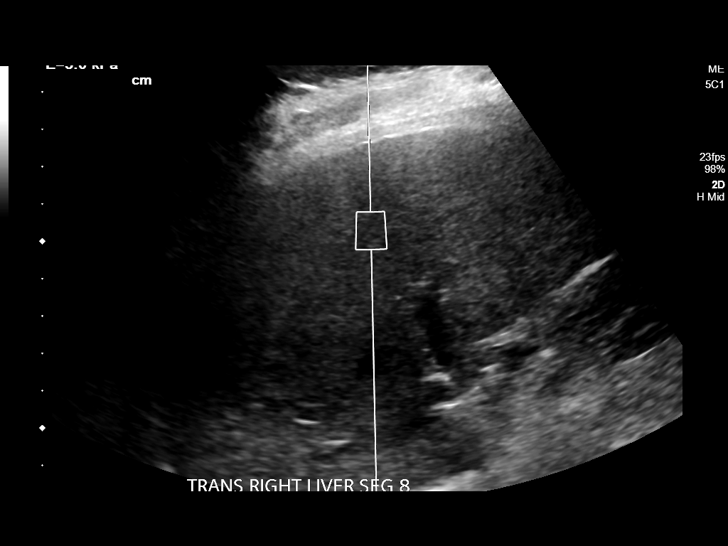
[im 8/14]
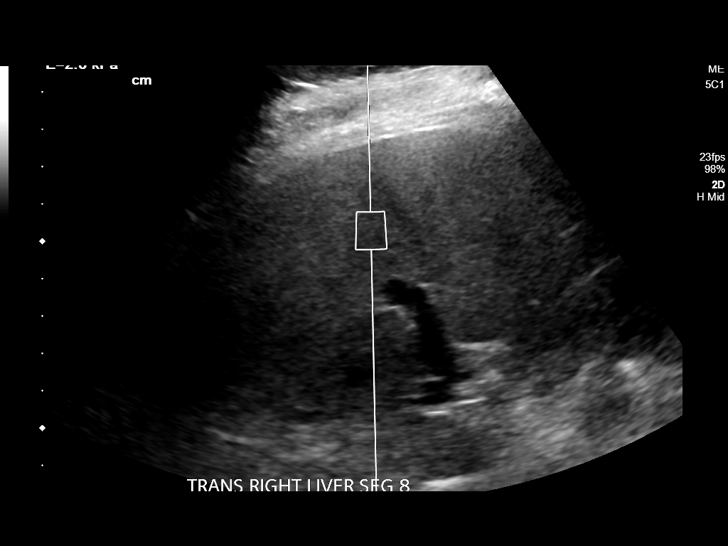
[im 9/14]
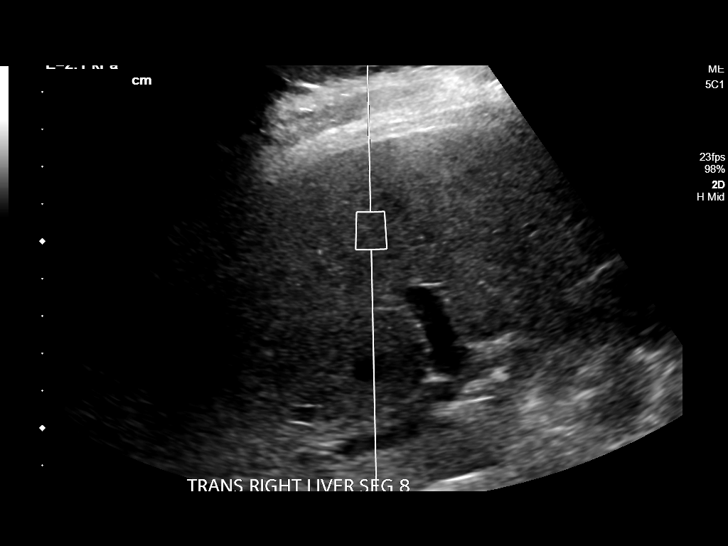
[im 10/14]
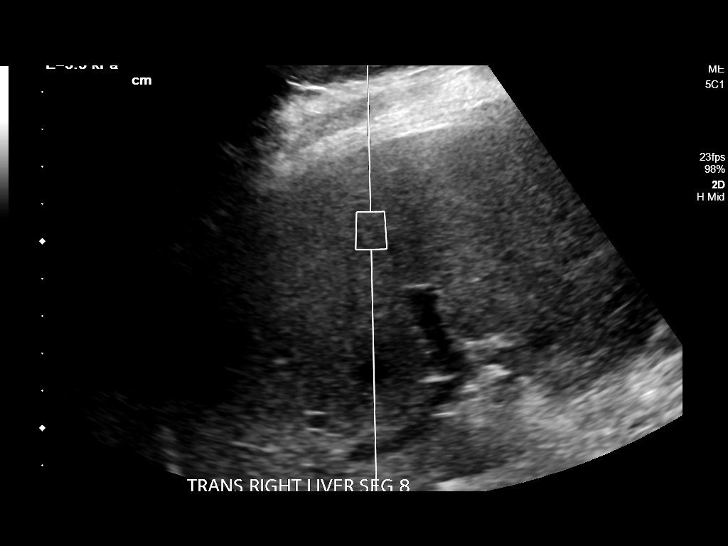
[im 11/14]
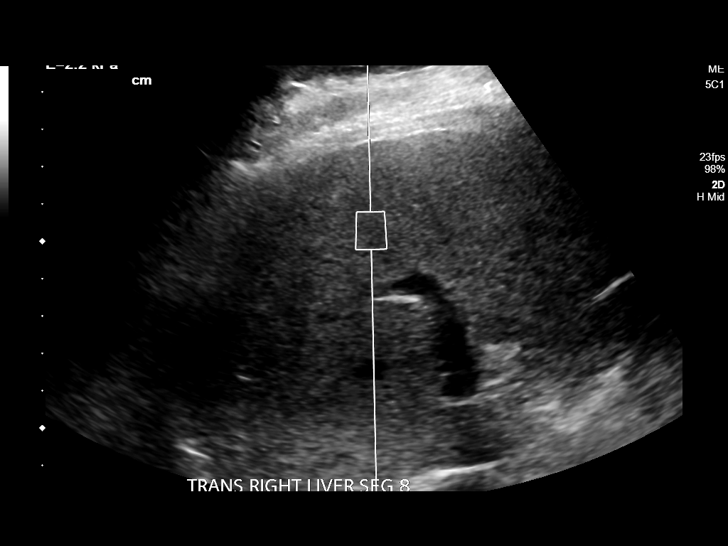
[im 12/14]
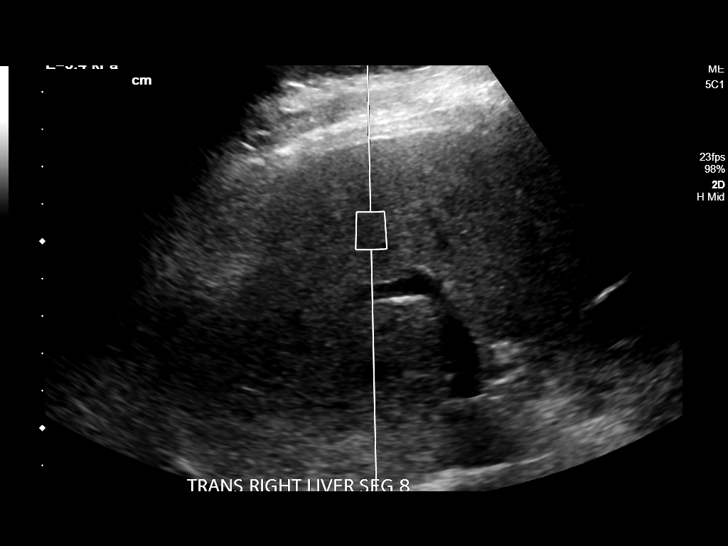
[im 13/14]
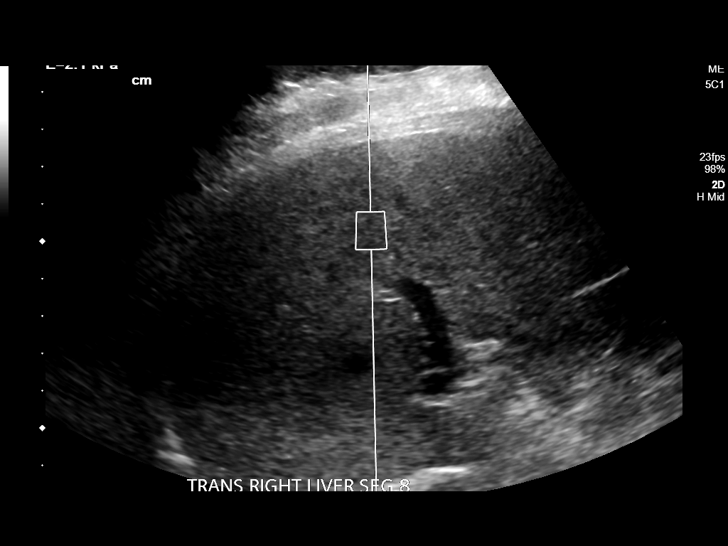
[im 14/14]
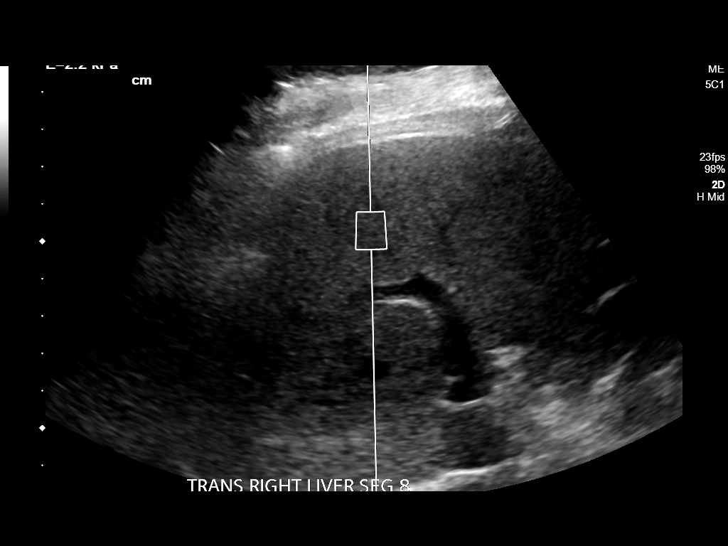

[14 of 14 positions shown; findings below may reference images not displayed]

FINDINGS: Portal Vein Velocities

Main:  46 cm/sec

Right:  40 cm/sec

Left:  43 cm/sec

Hepatic Vein Velocities

Right:  21 cm/sec

Middle:  30 cm/sec

Left:  28 cm/sec

Hepatic Artery Velocity:  200 cm/sec

Splenic Vein Velocity:  28 cm/sec

Varices: None.

Ascites: None.

Increased hepatic echogenicity compatible with hepatic steatosis.
Patent portal, hepatic and splenic veins with normal directional
flow. Negative for portal vein occlusion or thrombus. No free fluid
or ascites.
IMPRESSION: Normal hepatic venous Doppler.

## 2020-07-23 ENCOUNTER — Encounter: Payer: Self-pay | Admitting: Physician Assistant

## 2020-08-31 ENCOUNTER — Other Ambulatory Visit: Payer: Self-pay | Admitting: Physician Assistant

## 2020-08-31 DIAGNOSIS — I10 Essential (primary) hypertension: Secondary | ICD-10-CM

## 2020-10-14 ENCOUNTER — Telehealth: Payer: Self-pay

## 2020-10-14 NOTE — Telephone Encounter (Signed)
Copied from [place] [id number]. Topic: Appointment Scheduling - Scheduling Inquiry for Clinic >> Oct 14, 2020  4:14 PM Alanda Slim E wrote: Reason for CRM: Pt would like a sooner appt than 6.23.22/ pt stated she is having back pain. Please advise

## 2020-10-15 ENCOUNTER — Other Ambulatory Visit: Payer: Self-pay

## 2020-10-15 ENCOUNTER — Encounter: Payer: Self-pay | Admitting: Nurse Practitioner

## 2020-10-15 ENCOUNTER — Ambulatory Visit: Payer: BLUE CROSS/BLUE SHIELD | Admitting: Nurse Practitioner

## 2020-10-15 VITALS — BP 149/75 | HR 82 | Temp 98.8°F | Wt 216.0 lb

## 2020-10-15 DIAGNOSIS — M62838 Other muscle spasm: Secondary | ICD-10-CM

## 2020-10-15 MED ORDER — TRIAMCINOLONE ACETONIDE 40 MG/ML IJ SUSP
40.0000 mg | Freq: Once | INTRAMUSCULAR | Status: AC
  Administered 2020-10-15: 40 mg via INTRAMUSCULAR

## 2020-10-15 MED ORDER — CYCLOBENZAPRINE HCL 5 MG PO TABS: 5.0000 mg | ORAL_TABLET | Freq: Three times a day (TID) | ORAL | 0 refills | Status: DC | PRN

## 2020-10-15 MED ORDER — METHYLPREDNISOLONE 4 MG PO TBPK: ORAL_TABLET | ORAL | 0 refills | Status: DC

## 2020-10-15 NOTE — Progress Notes (Signed)
[  BP (!) 149/75   Pulse 82   Temp 98.8 F (37.1 C)   Wt 216 lb (98 kg)   SpO2 97%   BMI 33.83 kg/m    Subjective:    Patient ID: Michaela Hardy, female    DOB: 02/20/1956, 65 y.o.   MRN: [id number]  HPI: Michaela Hardy is a 65 y.o. female  Chief Complaint  Patient presents with  . Back Pain    X 1 month, was feeling better, taking 800 mg ibuprofen then went to the beach, now the pain is back and worse than it was. Patient states that she can not continue to take ibuprofen    BACK PAIN Patient states that about a month ago she had severe back pain.  Then she started taking 800mg  ibuprofen TID.  Then she went to the beach again and the pain returned.  She is able to do water aerobics. Duration: days Mechanism of injury: unknown Location: Left Onset: sudden Severity: 8/10 Quality: aching Frequency: intermittent Radiation: buttocks Aggravating factors: movement, laying and bending Alleviating factors: NSAIDs Status: stable Treatments attempted: ibuprofen  Relief with NSAIDs?: significant Nighttime pain:  no Paresthesias / decreased sensation:  no Bowel / bladder incontinence:  no Fevers:  no Dysuria / urinary frequency:  no  Relevant past medical, surgical, family and social history reviewed and updated as indicated. Interim medical history since our last visit reviewed. Allergies and medications reviewed and updated.  Review of Systems  Constitutional: Negative for fever.  Genitourinary: Positive for dysuria.  Musculoskeletal: Positive for back pain.  Neurological: Negative for numbness.    Per HPI unless specifically indicated above     Objective:    BP (!) 149/75   Pulse 82   Temp 98.8 F (37.1 C)   Wt 216 lb (98 kg)   SpO2 97%   BMI 33.83 kg/m   Wt Readings from Last 3 Encounters:  10/15/20 216 lb (98 kg)  07/02/20 215 lb (97.5 kg)  08/31/19 216 lb 9.6 oz (98.2 kg)    Physical Exam Vitals and nursing note reviewed.  Constitutional:       General: She is not in acute distress.    Appearance: Normal appearance. She is normal weight. She is not ill-appearing, toxic-appearing or diaphoretic.  HENT:     Head: Normocephalic.     Right Ear: External ear normal.     Left Ear: External ear normal.     Nose: Nose normal.     Mouth/Throat:     Mouth: Mucous membranes are moist.     Pharynx: Oropharynx is clear.  Eyes:     General:        Right eye: No discharge.        Left eye: No discharge.     Extraocular Movements: Extraocular movements intact.     Conjunctiva/sclera: Conjunctivae normal.     Pupils: Pupils are equal, round, and reactive to light.  Cardiovascular:     Rate and Rhythm: Normal rate and regular rhythm.     Heart sounds: No murmur heard.   Pulmonary:     Effort: Pulmonary effort is normal. No respiratory distress.     Breath sounds: Normal breath sounds. No wheezing or rales.  Musculoskeletal:     Cervical back: Normal range of motion and neck supple.       Back:  Skin:    General: Skin is warm and dry.     Capillary Refill: Capillary refill takes less than  2 seconds.  Neurological:     General: No focal deficit present.     Mental Status: She is alert and oriented to person, place, and time. Mental status is at baseline.  Psychiatric:        Mood and Affect: Mood normal.        Behavior: Behavior normal.        Thought Content: Thought content normal.        Judgment: Judgment normal.     Results for orders placed or performed in visit on 08/31/19  HM COLONOSCOPY  Result Value Ref Range   [facility] Colonoscopy See Report (in chart) See Report (in chart), Patient Reported      Assessment & Plan:   Problem List Items Addressed This Visit   None   Visit Diagnoses    Muscle spasm    -  Primary   Kenalog given in office today. Complete course of oral steroids. Use Flexeril and Ibuprofen PRN. Discussed side effects and benefits of flexeril. Follow up PRN.   Relevant Medications   methylPREDNISolone  (MEDROL DOSEPAK) 4 MG TBPK tablet   cyclobenzaprine (FLEXERIL) 5 MG tablet   triamcinolone acetonide (KENALOG-40) injection 40 mg (Completed)       Follow up plan: Return if symptoms worsen or fail to improve.   A total of 20 minutes were spent on this encounter today.  When total time is documented, this includes both the face-to-face and non-face-to-face time personally spent before, during and after the visit on the date of the encounter.

## 2020-11-25 ENCOUNTER — Encounter: Payer: BLUE CROSS/BLUE SHIELD | Admitting: Family Medicine

## 2020-11-25 NOTE — Progress Notes (Deleted)
Complete physical exam   Patient: Michaela Hardy   DOB: 4/48/1855   65 y.o. Female  MRN: [id number] Visit Date: 11/25/2020  Today's healthcare provider: Lavon Paganini, MD   No chief complaint on file.  Subjective     HPI  Michaela Hardy is a 65 y.o. female who presents today for a complete physical exam.  She reports consuming a {diet types:17450} diet. {Exercise:19826} She generally feels {well/fairly well/poorly:18703}. She reports sleeping {well/fairly well/poorly:18703}. She {does/does not:200015} have additional problems to discuss today.  Last Reported Mammogram-12/14/19 Colonoscopy- 06/21/17 BMD- 01/02/18 Past Medical History:  Diagnosis Date   Anxiety    Depression    GERD (gastroesophageal reflux disease)    Hepatitis    Primary bilary cholangitis   Hyperlipidemia    Hypertension    PONV (postoperative nausea and vomiting)    Past Surgical History:  Procedure Laterality Date   APPENDECTOMY  1976   BREAST BIOPSY Left 10/03/2014   Dr. Autumn Patty   BREAST EXCISIONAL BIOPSY Left    BREAST LUMPECTOMY WITH RADIOACTIVE SEED LOCALIZATION Left 11/21/2014   Procedure: LEFT BREAST LUMPECTOMY WITH RADIOACTIVE SEED LOCALIZATION;  Surgeon: Autumn Messing III, MD;  Location: [facility];  Service: General;  Laterality: Left;   CERVICAL BIOPSY  W/ LOOP ELECTRODE EXCISION     CHOLECYSTECTOMY  06/29/2007   LIVER BIOPSY  2008   TUBAL LIGATION  1987   Social History   Socioeconomic History   Marital status: Married    Spouse name: Juanda Crumble   Number of children: 2   Years of education: 14   Highest education level: Some college, no degree  Occupational History   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  Tobacco Use   Smoking status: Former    Packs/day: 1.50    Years: 40.00    Pack years: 60.00    Types: Cigarettes    Quit date: 05/10/2004    Years since quitting: 16.5   Smokeless tobacco: Never  Vaping Use   Vaping Use: Never  used  Substance and Sexual Activity   Alcohol use: Yes    Alcohol/week: 2.0 - 4.0 standard drinks    Types: 2 - 4 Glasses of wine per week   Drug use: No   Sexual activity: Not Currently  Other Topics Concern   Not on file  Social History Narrative   Not on file   Social Determinants of Health   Financial Resource Strain: Not on file  Food Insecurity: Not on file  Transportation Needs: Not on file  Physical Activity: Not on file  Stress: Not on file  Social Connections: Not on file  Intimate Partner Violence: Not on file   Family Status  Relation Name Status   Mother  Alive   Father  Deceased at age 35       Stroke   Brother  Alive   MGM  (Not Specified)   Neg Hx  (Not Specified)   Family History  Problem Relation Age of Onset   Hypertension Mother    Pancreatitis Mother    Thyroid disease Mother    Hypertension Father    Heart attack Father    Cerebrovascular Accident Father    Coronary artery disease Father    Glaucoma Father    Cancer Father        lung   Parkinson's disease Maternal Grandmother    Breast cancer Neg Hx    Colon cancer Neg Hx  Ovarian cancer Neg Hx    Allergies  Allergen Reactions   Amlodipine Other (See Comments)    headache   Nifedipine Other (See Comments)    Flushing and red face; headache    Patient Care Team: Mar Daring, PA-C as PCP - General (Family Medicine)   Medications: Outpatient Medications Prior to Visit  Medication Sig   calcium carbonate (OS-CAL) 600 MG TABS tablet Take 1 tablet by mouth daily with breakfast.    cyclobenzaprine (FLEXERIL) 5 MG tablet Take 1 tablet (5 mg total) by mouth 3 (three) times daily as needed for muscle spasms.   ketoconazole (NIZORAL) 2 % cream Apply 1 application topically daily.   meloxicam (MOBIC) 15 MG tablet TAKE 1 TABLET BY MOUTH EVERY DAY   methylPREDNISolone (MEDROL DOSEPAK) 4 MG TBPK tablet Take as directed   Multiple Vitamin tablet Take 1 tablet by mouth daily.    omeprazole (PRILOSEC) 20 MG capsule Take 1 capsule by mouth daily.   simvastatin (ZOCOR) 10 MG tablet TAKE 1 TABLET BY MOUTH EVERY DAY   telmisartan-hydrochlorothiazide (MICARDIS HCT) 80-25 MG tablet TAKE 1 TABLET BY MOUTH DAILY.   ursodiol (ACTIGALL) 300 MG capsule Take 2 capsules by mouth 2 (two) times daily.   No facility-administered medications prior to visit.    Review of Systems  All other systems reviewed and are negative.  {Labs  Heme  Chem  Endocrine  Serology  Results Review (optional):23779}  Objective    There were no vitals taken for this visit. {Show previous vital signs (optional):23777}  Physical Exam  ***  Last depression screening scores PHQ 2/9 Scores 07/02/2020 12/06/2017 11/30/2016  PHQ - 2 Score 0 0 0  PHQ- 9 Score 0 - 0   Last fall risk screening Fall Risk  07/02/2020  Falls in the past year? 0  Number falls in past yr: 0  Injury with Fall? 0  Risk for fall due to : No Fall Risks  Follow up Falls evaluation completed   Last Audit-C alcohol use screening Alcohol Use Disorder Test (AUDIT) 07/02/2020  1. How often do you have a drink containing alcohol? 3  2. How many drinks containing alcohol do you have on a typical day when you are drinking? 0  3. How often do you have six or more drinks on one occasion? 0  AUDIT-C Score 3  4. How often during the last year have you found that you were not able to stop drinking once you had started? -  5. How often during the last year have you failed to do what was normally expected from you because of drinking? -  6. How often during the last year have you needed a first drink in the morning to get yourself going after a heavy drinking session? -  7. How often during the last year have you had a feeling of guilt of remorse after drinking? -  8. How often during the last year have you been unable to remember what happened the night before because you had been drinking? -  9. Have you or someone else been injured as  a result of your drinking? -  10. Has a relative or friend or a doctor or another health worker been concerned about your drinking or suggested you cut down? -  Alcohol Brief Interventions/Follow-up -   A score of 3 or more in women, and 4 or more in men indicates increased risk for alcohol abuse, EXCEPT if all of the points are from  question 1   No results found for any visits on 11/25/20.  Assessment & Plan    Routine Health Maintenance and Physical Exam  Exercise Activities and Dietary recommendations  Goals      Exercise 150 minutes per week (moderate activity)        Immunization History  Administered Date(s) Administered   Hepatitis A 11/27/2008, 05/30/2009   Hepatitis B 11/27/2008, 12/26/2008, 05/30/2009, 01/22/2016   Hepatitis B, adult 01/22/2016   Influenza Inj Mdck Quad Pf 02/26/2017   Influenza,inj,Quad PF,6+ Mos 02/07/2019, 03/05/2020   Influenza-Unspecified 01/31/2018   PFIZER(Purple Top)SARS-COV-2 Vaccination 07/27/2019, 08/17/2019, 04/30/2020   Td 12/03/1998   Tdap 06/29/2012, 06/04/2016   Zoster Recombinat (Shingrix) 03/27/2019, 05/25/2019    Health Maintenance  Topic Date Due   HIV Screening  Never done   PAP SMEAR-Modifier  09/23/2017   COVID-19 Vaccine (4 - Booster for Pfizer series) 08/29/2020   INFLUENZA VACCINE  12/08/2020   MAMMOGRAM  12/13/2021   TETANUS/TDAP  06/04/2026   COLONOSCOPY (Pts 45-75yrs Insurance coverage will need to be confirmed)  06/22/2027   Hepatitis C Screening  Completed   Zoster Vaccines- Shingrix  Completed   Pneumococcal Vaccine 6-34 Years old  Aged Out   HPV VACCINES  Aged Out    Discussed health benefits of physical activity, and encouraged her to engage in regular exercise appropriate for her age and condition.  ***  No follow-ups on file.     {provider attestation***:1}   Lavon Paganini, MD  [facility] [phone] (phone) [phone] (fax)  [place]

## 2020-11-25 NOTE — Progress Notes (Deleted)
Complete physical exam   Patient: Michaela Hardy   DOB: [birth year]   65 y.o. Female  MRN: [id number] Visit Date: 11/25/2020  Today's healthcare provider: Lavon Paganini, MD   No chief complaint on file.  Subjective    Michaela Hardy is a 65 y.o. female who presents today for a complete physical exam.  She reports consuming a {diet types:17450} diet. {Exercise:19826} She generally feels {well/fairly well/poorly:18703}. She reports sleeping {well/fairly well/poorly:18703}. She {does/does not:200015} have additional problems to discuss today.  HPI  12/14/19 Mammogram-BI-RADS 1 06/21/17 Colonoscopy-WNL  Past Medical History:  Diagnosis Date   Anxiety    Depression    GERD (gastroesophageal reflux disease)    Hepatitis    Primary bilary cholangitis   Hyperlipidemia    Hypertension    PONV (postoperative nausea and vomiting)    Past Surgical History:  Procedure Laterality Date   APPENDECTOMY  1976   BREAST BIOPSY Left 10/03/2014   Dr. Autumn Patty   BREAST EXCISIONAL BIOPSY Left    BREAST LUMPECTOMY WITH RADIOACTIVE SEED LOCALIZATION Left 11/21/2014   Procedure: LEFT BREAST LUMPECTOMY WITH RADIOACTIVE SEED LOCALIZATION;  Surgeon: Autumn Messing III, MD;  Location: [facility];  Service: General;  Laterality: Left;   CERVICAL BIOPSY  W/ LOOP ELECTRODE EXCISION     CHOLECYSTECTOMY  06/29/2007   LIVER BIOPSY  2008   TUBAL LIGATION  1987   Social History   Socioeconomic History   Marital status: Married    Spouse name: Juanda Crumble   Number of children: 2   Years of education: 14   Highest education level: Some college, no degree  Occupational History   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  Tobacco Use   Smoking status: Former    Packs/day: 1.50    Years: 40.00    Pack years: 60.00    Types: Cigarettes    Quit date: 05/10/2004    Years since quitting: 16.5   Smokeless tobacco: Never  Vaping Use   Vaping Use: Never used   Substance and Sexual Activity   Alcohol use: Yes    Alcohol/week: 2.0 - 4.0 standard drinks    Types: 2 - 4 Glasses of wine per week   Drug use: No   Sexual activity: Not Currently  Other Topics Concern   Not on file  Social History Narrative   Not on file   Social Determinants of Health   Financial Resource Strain: Not on file  Food Insecurity: Not on file  Transportation Needs: Not on file  Physical Activity: Not on file  Stress: Not on file  Social Connections: Not on file  Intimate Partner Violence: Not on file   Family Status  Relation Name Status   Mother  Alive   Father  Deceased at age 64       Stroke   Brother  Alive   MGM  (Not Specified)   Neg Hx  (Not Specified)   Family History  Problem Relation Age of Onset   Hypertension Mother    Pancreatitis Mother    Thyroid disease Mother    Hypertension Father    Heart attack Father    Cerebrovascular Accident Father    Coronary artery disease Father    Glaucoma Father    Cancer Father        lung   Parkinson's disease Maternal Grandmother    Breast cancer Neg Hx    Colon cancer Neg Hx  Ovarian cancer Neg Hx    Allergies  Allergen Reactions   Amlodipine Other (See Comments)    headache   Nifedipine Other (See Comments)    Flushing and red face; headache    Patient Care Team: Mar Daring, PA-C as PCP - General (Family Medicine)   Medications: Outpatient Medications Prior to Visit  Medication Sig   calcium carbonate (OS-CAL) 600 MG TABS tablet Take 1 tablet by mouth daily with breakfast.    cyclobenzaprine (FLEXERIL) 5 MG tablet Take 1 tablet (5 mg total) by mouth 3 (three) times daily as needed for muscle spasms.   ketoconazole (NIZORAL) 2 % cream Apply 1 application topically daily.   meloxicam (MOBIC) 15 MG tablet TAKE 1 TABLET BY MOUTH EVERY DAY   methylPREDNISolone (MEDROL DOSEPAK) 4 MG TBPK tablet Take as directed   Multiple Vitamin tablet Take 1 tablet by mouth daily.   omeprazole  (PRILOSEC) 20 MG capsule Take 1 capsule by mouth daily.   simvastatin (ZOCOR) 10 MG tablet TAKE 1 TABLET BY MOUTH EVERY DAY   telmisartan-hydrochlorothiazide (MICARDIS HCT) 80-25 MG tablet TAKE 1 TABLET BY MOUTH DAILY.   ursodiol (ACTIGALL) 300 MG capsule Take 2 capsules by mouth 2 (two) times daily.   No facility-administered medications prior to visit.    Review of Systems  Last CBC Lab Results  Component Value Date   WBC 5.2 08/30/2019   HGB 12.0 08/30/2019   HCT 36.7 08/30/2019   MCV 91 08/30/2019   MCH 29.6 08/30/2019   RDW 13.4 08/30/2019   PLT 330 95/28/4132   Last metabolic panel Lab Results  Component Value Date   GLUCOSE 113 (H) 08/30/2019   NA 139 08/30/2019   K 4.3 08/30/2019   CL 100 08/30/2019   CO2 24 08/30/2019   BUN 13 08/30/2019   CREATININE 0.67 08/30/2019   GFRNONAA 94 08/30/2019   GFRAA 108 08/30/2019   CALCIUM 9.5 08/30/2019   PROT 6.9 08/30/2019   ALBUMIN 4.2 08/30/2019   LABGLOB 2.7 08/30/2019   AGRATIO 1.6 08/30/2019   BILITOT 0.3 08/30/2019   ALKPHOS 155 (H) 08/30/2019   AST 31 08/30/2019   ALT 38 (H) 08/30/2019   ANIONGAP 10 11/18/2014   Last lipids Lab Results  Component Value Date   CHOL 206 (H) 08/30/2019   HDL 61 08/30/2019   LDLCALC 120 (H) 08/30/2019   TRIG 142 08/30/2019   CHOLHDL 3.3 12/07/2017   Last hemoglobin A1c Lab Results  Component Value Date   HGBA1C 6.1 (H) 08/30/2019   Last thyroid functions Lab Results  Component Value Date   TSH 4.240 08/30/2019   Last vitamin D Lab Results  Component Value Date   VD25OH 39.0 08/30/2019     Objective    There were no vitals taken for this visit. BP Readings from Last 3 Encounters:  10/15/20 (!) 149/75  07/02/20 138/82  08/31/19 140/67   Wt Readings from Last 3 Encounters:  10/15/20 216 lb (98 kg)  07/02/20 215 lb (97.5 kg)  08/31/19 216 lb 9.6 oz (98.2 kg)      Physical Exam  ***  Last depression screening scores PHQ 2/9 Scores 07/02/2020 12/06/2017  11/30/2016  PHQ - 2 Score 0 0 0  PHQ- 9 Score 0 - 0   Last fall risk screening Fall Risk  07/02/2020  Falls in the past year? 0  Number falls in past yr: 0  Injury with Fall? 0  Risk for fall due to : No Fall Risks  Follow up Falls evaluation completed   Last Audit-C alcohol use screening Alcohol Use Disorder Test (AUDIT) 07/02/2020  1. How often do you have a drink containing alcohol? 3  2. How many drinks containing alcohol do you have on a typical day when you are drinking? 0  3. How often do you have six or more drinks on one occasion? 0  AUDIT-C Score 3  4. How often during the last year have you found that you were not able to stop drinking once you had started? -  5. How often during the last year have you failed to do what was normally expected from you because of drinking? -  6. How often during the last year have you needed a first drink in the morning to get yourself going after a heavy drinking session? -  7. How often during the last year have you had a feeling of guilt of remorse after drinking? -  8. How often during the last year have you been unable to remember what happened the night before because you had been drinking? -  9. Have you or someone else been injured as a result of your drinking? -  10. Has a relative or friend or a doctor or another health worker been concerned about your drinking or suggested you cut down? -  Alcohol Brief Interventions/Follow-up -   A score of 3 or more in women, and 4 or more in men indicates increased risk for alcohol abuse, EXCEPT if all of the points are from question 1   No results found for any visits on 11/25/20.  Assessment & Plan    Routine Health Maintenance and Physical Exam  Exercise Activities and Dietary recommendations  Goals      Exercise 150 minutes per week (moderate activity)        Immunization History  Administered Date(s) Administered   Hepatitis A 11/27/2008, 05/30/2009   Hepatitis B 11/27/2008,  12/26/2008, 05/30/2009   Hepatitis B, adult 01/22/2016   Influenza Inj Mdck Quad Pf 02/26/2017   Influenza,inj,Quad PF,6+ Mos 02/07/2019, 03/05/2020   Influenza-Unspecified 01/31/2018   PFIZER(Purple Top)SARS-COV-2 Vaccination 04/30/2020   Td 12/03/1998   Tdap 06/29/2012, 06/04/2016   Zoster Recombinat (Shingrix) 03/27/2019, 05/25/2019    Health Maintenance  Topic Date Due   HIV Screening  Never done   PAP SMEAR-Modifier  09/23/2017   COVID-19 Vaccine (2 - Pfizer series) 05/21/2020   INFLUENZA VACCINE  12/08/2020   MAMMOGRAM  12/13/2021   TETANUS/TDAP  06/04/2026   COLONOSCOPY (Pts 45-33yrs Insurance coverage will need to be confirmed)  06/22/2027   Hepatitis C Screening  Completed   Zoster Vaccines- Shingrix  Completed   Pneumococcal Vaccine 50-77 Years old  Aged Out   HPV VACCINES  Aged Out    Discussed health benefits of physical activity, and encouraged her to engage in regular exercise appropriate for her age and condition.  ***  No follow-ups on file.     {provider attestation***:1}   Lavon Paganini, MD  [facility] [phone] (phone) [phone] (fax)  [place]

## 2020-11-26 ENCOUNTER — Ambulatory Visit: Payer: BLUE CROSS/BLUE SHIELD | Admitting: Nurse Practitioner

## 2020-11-26 ENCOUNTER — Encounter: Payer: Self-pay | Admitting: Nurse Practitioner

## 2020-11-26 ENCOUNTER — Other Ambulatory Visit: Payer: Self-pay

## 2020-11-26 ENCOUNTER — Other Ambulatory Visit: Payer: Self-pay | Admitting: Obstetrics and Gynecology

## 2020-11-26 VITALS — BP 128/65 | HR 78 | Temp 98.6°F | Ht 66.58 in | Wt 215.5 lb

## 2020-11-26 DIAGNOSIS — Z0000 Encounter for general adult medical examination without abnormal findings: Secondary | ICD-10-CM | POA: Diagnosis not present

## 2020-11-26 DIAGNOSIS — Z114 Encounter for screening for human immunodeficiency virus [HIV]: Secondary | ICD-10-CM | POA: Diagnosis not present

## 2020-11-26 DIAGNOSIS — F329 Major depressive disorder, single episode, unspecified: Secondary | ICD-10-CM | POA: Diagnosis not present

## 2020-11-26 DIAGNOSIS — Z1231 Encounter for screening mammogram for malignant neoplasm of breast: Secondary | ICD-10-CM

## 2020-11-26 DIAGNOSIS — I10 Essential (primary) hypertension: Secondary | ICD-10-CM | POA: Diagnosis not present

## 2020-11-26 DIAGNOSIS — E7800 Pure hypercholesterolemia, unspecified: Secondary | ICD-10-CM

## 2020-11-26 DIAGNOSIS — R7303 Prediabetes: Secondary | ICD-10-CM | POA: Diagnosis not present

## 2020-11-26 LAB — URINALYSIS, ROUTINE W REFLEX MICROSCOPIC
Bilirubin, UA: NEGATIVE
Glucose, UA: NEGATIVE
Ketones, UA: NEGATIVE
Nitrite, UA: NEGATIVE
Protein,UA: NEGATIVE
RBC, UA: NEGATIVE
Specific Gravity, UA: >1.03 — ABNORMAL HIGH (ref 1.005–1.030)
Urobilinogen, Ur: 0.2 mg/dL (ref 0.2–1.0)
pH, UA: 5.5 (ref 5.0–7.5)

## 2020-11-26 LAB — MICROSCOPIC EXAMINATION
Epithelial Cells (non renal): NONE SEEN /hpf (ref 0–10)
RBC, Urine: NONE SEEN /hpf (ref 0–2)

## 2020-11-26 MED ORDER — ROSUVASTATIN CALCIUM 5 MG PO TABS: 5.0000 mg | ORAL_TABLET | Freq: Every day | ORAL | 3 refills | Status: DC

## 2020-11-26 NOTE — Progress Notes (Deleted)
There were no vitals taken for this visit.   Subjective:    Patient ID: Michaela Hardy, female    DOB: 22-Dec-1955, 65 y.o.   MRN: [id number]  HPI: Michaela Hardy is a 65 y.o. female  No chief complaint on file.  Patient presents to clinic to establish care with new PCP.  Patient reports a history of ***. Patient denies a history of: Hypertension, Elevated Cholesterol, Diabetes, Thyroid problems, Depression, Anxiety, Neurological problems, and Abdominal problems.   HYPERTENSION / HYPERLIPIDEMIA Satisfied with current treatment? {Blank single:19197::"yes","no"} Duration of hypertension: {Blank single:19197::"chronic","months","years"} BP monitoring frequency: {Blank single:19197::"not checking","rarely","daily","weekly","monthly","a few times a day","a few times a week","a few times a month"} BP range:  BP medication side effects: {Blank single:19197::"yes","no"} Past BP meds: {Blank LPFXTKWI:09735::"HGDJ","MEQASTMHDQ","QIWLNLGXQJ/JHERDEYCXK","GYJEHUDJ","SHFWYOVZCH","YIFOYDXAJO/INOM","VEHMCNOBSJ (bystolic)","carvedilol","chlorthalidone","clonidine","diltiazem","exforge HCT","HCTZ","irbesartan (avapro)","labetalol","lisinopril","lisinopril-HCTZ","losartan (cozaar)","methyldopa","nifedipine","olmesartan (benicar)","olmesartan-HCTZ","quinapril","ramipril","spironalactone","tekturna","valsartan","valsartan-HCTZ","verapamil"} Duration of hyperlipidemia: {Blank single:19197::"chronic","months","years"} Cholesterol medication side effects: {Blank single:19197::"yes","no"} Cholesterol supplements: {Blank multiple:19196::"none","fish oil","niacin","red yeast rice"} Past cholesterol medications: {Blank multiple:19196::"none","atorvastain (lipitor)","lovastatin (mevacor)","pravastatin (pravachol)","rosuvastatin (crestor)","simvastatin (zocor)","vytorin","fenofibrate (tricor)","gemfibrozil","ezetimide (zetia)","niaspan","lovaza"} Medication compliance: {Blank single:19197::"excellent  compliance","good compliance","fair compliance","poor compliance"} Aspirin: {Blank single:19197::"yes","no"} Recent stressors: {Blank single:19197::"yes","no"} Recurrent headaches: {Blank single:19197::"yes","no"} Visual changes: {Blank single:19197::"yes","no"} Palpitations: {Blank single:19197::"yes","no"} Dyspnea: {Blank single:19197::"yes","no"} Chest pain: {Blank single:19197::"yes","no"} Lower extremity edema: {Blank single:19197::"yes","no"} Dizzy/lightheaded: {Blank single:19197::"yes","no"}  The 10-year ASCVD risk score Michaela Bussing DC Jr., et al., 2013) is: 8.7%   Values used to calculate the score:     Age: 82 years     Sex: Female     Is Non-Hispanic African American: No     Diabetic: No     Tobacco smoker: No     Systolic Blood Pressure: 628 mmHg     Is BP treated: Yes     HDL Cholesterol: 61 mg/dL     Total Cholesterol: 206 mg/dL   Active Ambulatory Problems    Diagnosis Date Noted   Other migraine, not intractable, without status migrainosus 08/10/2014   Depressive disorder 08/10/2014   Acid reflux 08/10/2014   Other specified bacterial intestinal infections 08/10/2014   Hypercholesteremia 08/10/2014   Hypertensive disorder 08/10/2014   Insomnia 08/10/2014   Allergic sinusitis 01/14/2015   Eustachian tube dysfunction 01/14/2015   Primary biliary cholangitis ([place]) 11/15/2014   Gastro-esophageal reflux disease without esophagitis 08/10/2014   Elevated blood sugar 10/20/2015   Disease of liver 05/25/2019   Major depressive disorder, single episode 08/10/2014   Biliary cirrhosis ([place]) 05/25/2019   Resolved Ambulatory Problems    Diagnosis Date Noted   Routine general medical examination at a health care facility 08/10/2014   Primary biliary cholangitis ([place]) 08/10/2014   Sinusitis, acute 01/24/2015   Sinusitis, acute 05/07/2015   Hypercholesterolemia 05/25/2019   Past Medical History:  Diagnosis Date   Anxiety    Depression    GERD (gastroesophageal reflux  disease)    Hepatitis    Hyperlipidemia    Hypertension    PONV (postoperative nausea and vomiting)    Past Surgical History:  Procedure Laterality Date   APPENDECTOMY  1976   BREAST BIOPSY Left 10/03/2014   Dr. Autumn Patty   BREAST EXCISIONAL BIOPSY Left    BREAST LUMPECTOMY WITH RADIOACTIVE SEED LOCALIZATION Left 11/21/2014   Procedure: LEFT BREAST LUMPECTOMY WITH RADIOACTIVE SEED LOCALIZATION;  Surgeon: Autumn Messing III, MD;  Location: [facility];  Service: General;  Laterality: Left;   CERVICAL BIOPSY  W/ LOOP ELECTRODE EXCISION     CHOLECYSTECTOMY  06/29/2007   LIVER BIOPSY  2008   TUBAL LIGATION  1987   Family History  Problem Relation Age of Onset  Hypertension Mother    Pancreatitis Mother    Thyroid disease Mother    Hypertension Father    Heart attack Father    Cerebrovascular Accident Father    Coronary artery disease Father    Glaucoma Father    Cancer Father        lung   Parkinson's disease Maternal Grandmother    Breast cancer Neg Hx    Colon cancer Neg Hx    Ovarian cancer Neg Hx     Relevant past medical, surgical, family and social history reviewed and updated as indicated. Interim medical history since our last visit reviewed. Allergies and medications reviewed and updated.  Review of Systems  Per HPI unless specifically indicated above     Objective:    There were no vitals taken for this visit.  Wt Readings from Last 3 Encounters:  10/15/20 216 lb (98 kg)  07/02/20 215 lb (97.5 kg)  08/31/19 216 lb 9.6 oz (98.2 kg)    Physical Exam  Results for orders placed or performed in visit on 08/31/19  HM COLONOSCOPY  Result Value Ref Range   HM Colonoscopy See Report (in chart) See Report (in chart), Patient Reported      Assessment & Plan:   Problem List Items Addressed This Visit   None    Follow up plan: No follow-ups on file.

## 2020-11-26 NOTE — Assessment & Plan Note (Signed)
Chronic. Simvastatin changed to Crestor due to ASCVD score of 8.7%.  Will Follow up and recheck lab work at next visit.

## 2020-11-26 NOTE — Assessment & Plan Note (Signed)
Chronic.  Controlled without medication.  Follow up if symptoms worsen or fail to improve.

## 2020-11-26 NOTE — Assessment & Plan Note (Signed)
Chronic.  Labs ordered today. Will make recommendations based on lab results.  

## 2020-11-26 NOTE — Assessment & Plan Note (Signed)
Chronic.  Controlled.  Continue with current medication regimen.  Labs ordered today.  Return to clinic in 6 months for reevaluation.  Call sooner if concerns arise.  ? ?

## 2020-11-26 NOTE — Progress Notes (Signed)
BP 128/65   Pulse 78   Temp 98.6 F (37 C)   Ht 5' 6.58" (1.691 m)   Wt 215 lb 8 oz (97.8 kg)   SpO2 97%   BMI 34.18 kg/m    Subjective:    Patient ID: Michaela Hardy, female    DOB: June 20, 1955, 65 y.o.   MRN: [id number]  HPI: Michaela Hardy is a 65 y.o. female presenting on 11/26/2020 for comprehensive medical examination. Current medical complaints include:none  She currently lives with: Husband Menopausal Symptoms: no   Patient presents to clinic to establish care with new PCP.  Patient reports a history of Hypertension, HLD, prediabetes, depression, autoimmune disease of the liver.  Patient denies a history of: Thyroid problems, Depression, Anxiety, Neurological problems, and Abdominal problems.   HYPERTENSION / HYPERLIPIDEMIA Satisfied with current treatment? no Duration of hypertension: years BP monitoring frequency: not checking BP range:  BP medication side effects: no Past BP meds: telmisartan/HCTZ Duration of hyperlipidemia: years Cholesterol medication side effects: no Cholesterol supplements: none Past cholesterol medications: simvastatin (zocor) Medication compliance: excellent compliance Aspirin: no Recent stressors: no Recurrent headaches: no Visual changes: no Palpitations: no Dyspnea: no Chest pain: no Lower extremity edema: no Dizzy/lightheaded: no  The 10-year ASCVD risk score [name], et al., 2013) is: 8.7%   Values used to calculate the score:     Age: 36 years     Sex: Female     Is Non-Hispanic African American: No     Diabetic: No     Tobacco smoker: No     Systolic Blood Pressure: 409 mmHg     Is BP treated: Yes     HDL Cholesterol: 61 mg/dL     Total Cholesterol: 206 mg/dL  DEPRESSION Patient states it is well controlled.  Not currently on medication and does not feel like she needs it.  Denies SI.  Depression Screen done today and results listed below:  Depression screen [facility] 2/9 11/26/2020 11/26/2020 07/02/2020 12/06/2017  11/30/2016  Decreased Interest 0 0 0 0 0  Down, Depressed, Hopeless 0 0 0 0 0  PHQ - 2 Score 0 0 0 0 0  Altered sleeping 0 - 0 - 0  Tired, decreased energy 0 - 0 - 0  Change in appetite 0 - 0 - 0  Feeling bad or failure about yourself  0 - 0 - 0  Trouble concentrating 0 - 0 - 0  Moving slowly or fidgety/restless 0 - 0 - 0  Suicidal thoughts 0 - 0 - 0  PHQ-9 Score 0 - 0 - 0  Difficult doing work/chores Not difficult at all - Not difficult at all - Not difficult at all    The patient has a history of falls. I did complete a risk assessment for falls. A plan of care for falls was documented.   Past Medical History:  Past Medical History:  Diagnosis Date   Anxiety    Depression    GERD (gastroesophageal reflux disease)    Hepatitis    Primary bilary cholangitis   Hyperlipidemia    Hypertension    PONV (postoperative nausea and vomiting)     Surgical History:  Past Surgical History:  Procedure Laterality Date   APPENDECTOMY  1976   BREAST BIOPSY Left 10/03/2014   Dr. Autumn Patty   BREAST EXCISIONAL BIOPSY Left    BREAST LUMPECTOMY WITH RADIOACTIVE SEED LOCALIZATION Left 11/21/2014   Procedure: LEFT BREAST LUMPECTOMY WITH RADIOACTIVE SEED LOCALIZATION;  Surgeon: Autumn Messing  III, MD;  Location: [place];  Service: General;  Laterality: Left;   CERVICAL BIOPSY  W/ LOOP ELECTRODE EXCISION     CHOLECYSTECTOMY  06/29/2007   LIVER BIOPSY  2008   TUBAL LIGATION  1987    Medications:  Current Outpatient Medications on File Prior to Visit  Medication Sig   calcium carbonate (OS-CAL) 600 MG TABS tablet Take 1 tablet by mouth daily with breakfast.    ketoconazole (NIZORAL) 2 % cream Apply 1 application topically daily.   meloxicam (MOBIC) 15 MG tablet TAKE 1 TABLET BY MOUTH EVERY DAY   Multiple Vitamin tablet Take 1 tablet by mouth daily.   omeprazole (PRILOSEC) 20 MG capsule Take 1 capsule by mouth daily.   telmisartan-hydrochlorothiazide (MICARDIS HCT) 80-25 MG tablet  TAKE 1 TABLET BY MOUTH DAILY.   ursodiol (ACTIGALL) 300 MG capsule Take 2 capsules by mouth 2 (two) times daily.   No current facility-administered medications on file prior to visit.    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  Tobacco Use   Smoking status: Former    Packs/day: 1.50    Years: 40.00    Pack years: 60.00    Types: Cigarettes    Quit date: 05/10/2004    Years since quitting: 16.5   Smokeless tobacco: Never  Vaping Use   Vaping Use: Never used  Substance and Sexual Activity   Alcohol use: Yes    Alcohol/week: 2.0 - 4.0 standard drinks    Types: 2 - 4 Glasses of wine per week   Drug use: No   Sexual activity: Not Currently  Other Topics Concern   Not on file  Social History Narrative   Not on file   Social Determinants of Health   Financial Resource Strain: Not on file  Food Insecurity: Not on file  Transportation Needs: Not on file  Physical Activity: Not on file  Stress: Not on file  Social Connections: Not on file  Intimate Partner Violence: Not on file   Social History   Tobacco Use  Smoking Status Former   Packs/day: 1.50   Years: 40.00   Pack years: 60.00   Types: Cigarettes   Quit date: 05/10/2004   Years since quitting: 16.5  Smokeless Tobacco Never   Social History   Substance and Sexual Activity  Alcohol Use Yes   Alcohol/week: 2.0 - 4.0 standard drinks   Types: 2 - 4 Glasses of wine per week    Family History:  Family History  Problem Relation Age of Onset   Hypertension Mother    Pancreatitis Mother     Thyroid disease Mother    Hypertension Father    Heart attack Father    Cerebrovascular Accident Father    Coronary artery disease Father    Glaucoma Father    Cancer Father        lung   Parkinson's disease Maternal Grandmother    Breast cancer Neg Hx    Colon cancer Neg Hx    Ovarian  cancer Neg Hx     Past medical history, surgical history, medications, allergies, family history and social history reviewed with patient today and changes made to appropriate areas of the chart.   Review of Systems  Eyes:  Negative for blurred vision and double vision.  Respiratory:  Negative for shortness of breath.   Cardiovascular:  Negative for chest pain, palpitations and leg swelling.  Neurological:  Negative for dizziness and headaches.  Psychiatric/Behavioral:  Negative for depression and suicidal ideas.   All other ROS negative except what is listed above and in the HPI.      Objective:    BP 128/65   Pulse 78   Temp 98.6 F (37 C)   Ht 5' 6.58" (1.691 m)   Wt 215 lb 8 oz (97.8 kg)   SpO2 97%   BMI 34.18 kg/m   Wt Readings from Last 3 Encounters:  11/26/20 215 lb 8 oz (97.8 kg)  10/15/20 216 lb (98 kg)  07/02/20 215 lb (97.5 kg)    Physical Exam Vitals and nursing note reviewed.  Constitutional:      General: She is awake. She is not in acute distress.    Appearance: She is well-developed. She is not ill-appearing.  HENT:     Head: Normocephalic and atraumatic.     Right Ear: Hearing, tympanic membrane, ear canal and external ear normal. No drainage.     Left Ear: Hearing, tympanic membrane, ear canal and external ear normal. No drainage.     Nose: Nose normal.     Right Sinus: No maxillary sinus tenderness or frontal sinus tenderness.     Left Sinus: No maxillary sinus tenderness or frontal sinus tenderness.     Mouth/Throat:     Mouth: Mucous membranes are moist.     Pharynx: Oropharynx is clear. Uvula midline. No pharyngeal swelling, oropharyngeal exudate or posterior  oropharyngeal erythema.  Eyes:     General: Lids are normal.        Right eye: No discharge.        Left eye: No discharge.     Extraocular Movements: Extraocular movements intact.     Conjunctiva/sclera: Conjunctivae normal.     Pupils: Pupils are equal, round, and reactive to light.     Visual Fields: Right eye visual fields normal and left eye visual fields normal.  Neck:     Thyroid: No thyromegaly.     Vascular: No carotid bruit.     Trachea: Trachea normal.  Cardiovascular:     Rate and Rhythm: Normal rate and regular rhythm.     Heart sounds: Normal heart sounds. No murmur heard.   No gallop.  Pulmonary:     Effort: Pulmonary effort is normal. No accessory muscle usage or respiratory distress.     Breath sounds: Normal breath sounds.  Chest:  Breasts:    Right: Normal. No axillary adenopathy or supraclavicular adenopathy.     Left: Normal. No axillary adenopathy or supraclavicular adenopathy.  Abdominal:     General: Bowel sounds are normal.     Palpations: Abdomen is soft. There is no hepatomegaly or splenomegaly.     Tenderness: There is no abdominal tenderness.  Musculoskeletal:        General: Normal range of motion.     Cervical back: Normal range of motion and neck supple.     Right lower leg: No edema.     Left lower leg: No edema.  Lymphadenopathy:     Head:  Right side of head: No submental, submandibular, tonsillar, preauricular or posterior auricular adenopathy.     Left side of head: No submental, submandibular, tonsillar, preauricular or posterior auricular adenopathy.     Cervical: No cervical adenopathy.     Upper Body:     Right upper body: No supraclavicular, axillary or pectoral adenopathy.     Left upper body: No supraclavicular, axillary or pectoral adenopathy.  Skin:    General: Skin is warm and dry.     Capillary Refill: Capillary refill takes less than 2 seconds.     Findings: No rash.  Neurological:     Mental Status: She is alert and  oriented to person, place, and time.     Cranial Nerves: Cranial nerves are intact.     Gait: Gait is intact.     Deep Tendon Reflexes: Reflexes are normal and symmetric.     Reflex Scores:      Brachioradialis reflexes are 2+ on the right side and 2+ on the left side.      Patellar reflexes are 2+ on the right side and 2+ on the left side. Psychiatric:        Attention and Perception: Attention normal.        Mood and Affect: Mood normal.        Speech: Speech normal.        Behavior: Behavior normal. Behavior is cooperative.        Thought Content: Thought content normal.        Judgment: Judgment normal.    Results for orders placed or performed in visit on 08/31/19  HM COLONOSCOPY  Result Value Ref Range   HM Colonoscopy See Report (in chart) See Report (in chart), Patient Reported      Assessment & Plan:   Problem List Items Addressed This Visit       Cardiovascular and Mediastinum   Hypertensive disorder    Chronic.  Controlled.  Continue with current medication regimen.  Labs ordered today.  Return to clinic in 6 months for reevaluation.  Call sooner if concerns arise.         Relevant Medications   rosuvastatin (CRESTOR) 5 MG tablet     Other   Hypercholesteremia    Chronic. Simvastatin changed to Crestor due to ASCVD score of 8.7%.  Will Follow up and recheck lab work at next visit.        Relevant Medications   rosuvastatin (CRESTOR) 5 MG tablet   Major depressive disorder, single episode    Chronic.  Controlled without medication.  Follow up if symptoms worsen or fail to improve.        Prediabetes    Chronic. Labs ordered today. Will make recommendations based on lab results.        Relevant Orders   HgB A1c   Other Visit Diagnoses     Annual physical exam    -  Primary   Health mainteance reviewed today.  Labs ordered today. Vaccines up to date. Pap done at GYN. Mammogram already scheduled for August.   Relevant Orders   CBC with  Differential/Platelet   Comprehensive metabolic panel   Lipid panel   TSH   Urinalysis, Routine w reflex microscopic   Screening for HIV (human immunodeficiency virus)       Relevant Orders   HIV Antibody (routine testing w rflx)   Encounter for screening mammogram for malignant neoplasm of breast       Relevant Orders  MM Digital Screening        Follow up plan: Return in about 6 months (around 05/29/2021) for HTN, HLD, DM2 FU.   LABORATORY TESTING:  - Pap smear: pap done  IMMUNIZATIONS:   - Tdap: Tetanus vaccination status reviewed: last tetanus booster within 10 years. - Influenza: Up to date - Pneumovax: Not applicable - Prevnar: Not applicable - HPV: Not applicable - Zostavax vaccine: Up to date  SCREENING: -Mammogram: Ordered today  - Colonoscopy: Up to date  - Bone Density: Not applicable  -Hearing Test: Not applicable  -Spirometry: Not applicable   PATIENT COUNSELING:   Advised to take 1 mg of folate supplement per day if capable of pregnancy.   Sexuality: Discussed sexually transmitted diseases, partner selection, use of condoms, avoidance of unintended pregnancy  and contraceptive alternatives.   Advised to avoid cigarette smoking.  I discussed with the patient that most people either abstain from alcohol or drink within safe limits (<=14/week and <=4 drinks/occasion for males, <=7/weeks and <= 3 drinks/occasion for females) and that the risk for alcohol disorders and other health effects rises proportionally with the number of drinks per week and how often a drinker exceeds daily limits.  Discussed cessation/primary prevention of drug use and availability of treatment for abuse.   Diet: Encouraged to adjust caloric intake to maintain  or achieve ideal body weight, to reduce intake of dietary saturated fat and total fat, to limit sodium intake by avoiding high sodium foods and not adding table salt, and to maintain adequate dietary potassium and calcium  preferably from fresh fruits, vegetables, and low-fat dairy products.    stressed the importance of regular exercise  Injury prevention: Discussed safety belts, safety helmets, smoke detector, smoking near bedding or upholstery.   Dental health: Discussed importance of regular tooth brushing, flossing, and dental visits.    NEXT PREVENTATIVE PHYSICAL DUE IN 1 YEAR. Return in about 6 months (around 05/29/2021) for HTN, HLD, DM2 FU.

## 2020-11-27 LAB — CBC WITH DIFFERENTIAL/PLATELET
Basophils Absolute: 0.1 10*3/uL (ref 0.0–0.2)
Basos: 1 %
EOS (ABSOLUTE): 0.1 10*3/uL (ref 0.0–0.4)
Eos: 1 %
Hematocrit: 38.3 % (ref 34.0–46.6)
Hemoglobin: 12.9 g/dL (ref 11.1–15.9)
Immature Grans (Abs): 0 10*3/uL (ref 0.0–0.1)
Immature Granulocytes: 0 %
Lymphocytes Absolute: 1.6 10*3/uL (ref 0.7–3.1)
Lymphs: 21 %
MCH: 30.1 pg (ref 26.6–33.0)
MCHC: 33.7 g/dL (ref 31.5–35.7)
MCV: 89 fL (ref 79–97)
Monocytes Absolute: 0.7 10*3/uL (ref 0.1–0.9)
Monocytes: 10 %
Neutrophils Absolute: 4.9 10*3/uL (ref 1.4–7.0)
Neutrophils: 67 %
Platelets: 385 10*3/uL (ref 150–450)
RBC: 4.29 x10E6/uL (ref 3.77–5.28)
RDW: 12.7 % (ref 11.7–15.4)
WBC: 7.3 10*3/uL (ref 3.4–10.8)

## 2020-11-27 LAB — COMPREHENSIVE METABOLIC PANEL
ALT: 42 IU/L — ABNORMAL HIGH (ref 0–32)
AST: 28 IU/L (ref 0–40)
Albumin/Globulin Ratio: 1.7 (ref 1.2–2.2)
Albumin: 4.6 g/dL (ref 3.8–4.8)
Alkaline Phosphatase: 153 IU/L — ABNORMAL HIGH (ref 44–121)
BUN/Creatinine Ratio: 30 — ABNORMAL HIGH (ref 12–28)
BUN: 20 mg/dL (ref 8–27)
Bilirubin Total: 0.2 mg/dL (ref 0.0–1.2)
CO2: 23 mmol/L (ref 20–29)
Calcium: 9.7 mg/dL (ref 8.7–10.3)
Chloride: 100 mmol/L (ref 96–106)
Creatinine, Ser: 0.67 mg/dL (ref 0.57–1.00)
Globulin, Total: 2.7 g/dL (ref 1.5–4.5)
Glucose: 152 mg/dL — ABNORMAL HIGH (ref 65–99)
Potassium: 3.7 mmol/L (ref 3.5–5.2)
Sodium: 141 mmol/L (ref 134–144)
Total Protein: 7.3 g/dL (ref 6.0–8.5)
eGFR: 98 mL/min/{1.73_m2} (ref >59)

## 2020-11-27 LAB — LIPID PANEL
Chol/HDL Ratio: 3.3 ratio (ref 0.0–4.4)
Cholesterol, Total: 208 mg/dL — ABNORMAL HIGH (ref 100–199)
HDL: 63 mg/dL (ref >39)
LDL Chol Calc (NIH): 123 mg/dL — ABNORMAL HIGH (ref 0–99)
Triglycerides: 126 mg/dL (ref 0–149)
VLDL Cholesterol Cal: 22 mg/dL (ref 5–40)

## 2020-11-27 LAB — HIV ANTIBODY (ROUTINE TESTING W REFLEX): HIV Screen 4th Generation wRfx: NONREACTIVE

## 2020-11-27 LAB — TSH: TSH: 2.89 u[IU]/mL (ref 0.450–4.500)

## 2020-11-27 LAB — HEMOGLOBIN A1C
Est. average glucose Bld gHb Est-mCnc: 134 mg/dL
Hgb A1c MFr Bld: 6.3 % — ABNORMAL HIGH (ref 4.8–5.6)

## 2020-11-27 NOTE — Progress Notes (Signed)
Hi Michaela Hardy.  Over all your lab work looks good.  Your complete blood count is normal. Liver enzymes are slightly elevated but look like they are consistent with your prior liver enzymes.  We will continue to monitor them at future visits.   Choelsterol is slightly elevated, I think we will see this improve with the Crestor. A1c is slightly elevated to 6.3.  Continue with low carb diet and exercise.    Otherwise, your labs look good.  Follow up as discussed.

## 2020-12-10 ENCOUNTER — Other Ambulatory Visit: Payer: Self-pay

## 2020-12-10 ENCOUNTER — Encounter: Payer: Self-pay | Admitting: Nurse Practitioner

## 2020-12-10 ENCOUNTER — Ambulatory Visit: Payer: BLUE CROSS/BLUE SHIELD | Admitting: Nurse Practitioner

## 2020-12-10 VITALS — BP 122/72 | HR 76 | Temp 99.2°F | Wt 215.4 lb

## 2020-12-10 DIAGNOSIS — S76912A Strain of unspecified muscles, fascia and tendons at thigh level, left thigh, initial encounter: Secondary | ICD-10-CM

## 2020-12-10 MED ORDER — TIZANIDINE HCL 4 MG PO TABS: 4.0000 mg | ORAL_TABLET | Freq: Four times a day (QID) | ORAL | 0 refills | Status: DC | PRN

## 2020-12-10 NOTE — Progress Notes (Signed)
Acute Office Visit  Subjective:    Patient ID: Michaela Hardy, female    DOB: [birth year], 65 y.o.   MRN: [id number]  Chief Complaint  Patient presents with   Muscle Pain    Pt states she has been having pains in her hips and thighs, states the L is way worse than the R. States she does water aerobics and feels like she injured it then. States Epsom salt baths help but then the pain comes right back     HPI Patient is in today for left leg pain in upper thigh and groin. Her symptoms started the morning after she did some new exercises in her water aerobics class. Both legs were hurting at first, but now it's mostly her left leg. She states the pain is 4/10 and achy feeling. She is taking ibuprofen, using epsom salt baths, and a heating pad. This helps while she is doing it, but then the pain comes back. She had an old prescription for flexeril, but it made her very groggy and she stopped taking it. She denies numbness, tingling, and weakness.    Past Medical History:  Diagnosis Date   Anxiety    Depression    GERD (gastroesophageal reflux disease)    Hepatitis    Primary bilary cholangitis   Hyperlipidemia    Hypertension    PONV (postoperative nausea and vomiting)     Past Surgical History:  Procedure Laterality Date   APPENDECTOMY  1976   BREAST BIOPSY Left 10/03/2014   Dr. Autumn Patty   BREAST EXCISIONAL BIOPSY Left    BREAST LUMPECTOMY WITH RADIOACTIVE SEED LOCALIZATION Left 11/21/2014   Procedure: LEFT BREAST LUMPECTOMY WITH RADIOACTIVE SEED LOCALIZATION;  Surgeon: Autumn Messing III, MD;  Location: [place];  Service: General;  Laterality: Left;   CERVICAL BIOPSY  W/ LOOP ELECTRODE EXCISION     CHOLECYSTECTOMY  06/29/2007   LIVER BIOPSY  2008   TUBAL LIGATION  1987    Family History  Problem Relation Age of Onset   Hypertension Mother    Pancreatitis Mother    Thyroid disease Mother    Hypertension Father    Heart attack Father    Cerebrovascular  Accident Father    Coronary artery disease Father    Glaucoma Father    Cancer Father        lung   Parkinson's disease Maternal Grandmother    Breast cancer Neg Hx    Colon cancer Neg Hx    Ovarian cancer Neg Hx     Social History   Socioeconomic History   Marital status: Married    Spouse name: Juanda Crumble   Number of children: 2   Years of education: 14   Highest education level: Some college, no degree  Occupational History   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  Tobacco Use   Smoking status: Former    Packs/day: 1.50    Years: 40.00    Pack years: 60.00    Types: Cigarettes    Quit date: 05/10/2004    Years since quitting: 16.5   Smokeless tobacco: Never  Vaping Use   Vaping Use: Never used  Substance and Sexual Activity   Alcohol use: Yes    Alcohol/week: 2.0 - 4.0 standard drinks    Types: 2 - 4 Glasses of wine per week   Drug use: No   Sexual activity: Not Currently  Other Topics Concern   Not on file  Social  History Narrative   Not on file   Social Determinants of Health   Financial Resource Strain: Not on file  Food Insecurity: Not on file  Transportation Needs: Not on file  Physical Activity: Not on file  Stress: Not on file  Social Connections: Not on file  Intimate Partner Violence: Not on file    Outpatient Medications Prior to Visit  Medication Sig Dispense Refill   calcium carbonate (OS-CAL) 600 MG TABS tablet Take 1 tablet by mouth daily with breakfast.      ketoconazole (NIZORAL) 2 % cream Apply 1 application topically daily. 60 g 5   meloxicam (MOBIC) 15 MG tablet TAKE 1 TABLET BY MOUTH EVERY DAY 90 tablet 1   Multiple Vitamin tablet Take 1 tablet by mouth daily.     omeprazole (PRILOSEC) 20 MG capsule Take 1 capsule by mouth daily.     rosuvastatin (CRESTOR) 5 MG tablet Take 1 tablet (5 mg total) by mouth daily. 90 tablet 3   telmisartan-hydrochlorothiazide (MICARDIS HCT) 80-25 MG tablet TAKE 1 TABLET BY MOUTH  DAILY. 90 tablet 0   ursodiol (ACTIGALL) 300 MG capsule Take 2 capsules by mouth 2 (two) times daily.     No facility-administered medications prior to visit.    Allergies  Allergen Reactions   Amlodipine Other (See Comments)    headache   Nifedipine Other (See Comments)    Flushing and red face; headache    Review of Systems  Constitutional: Negative.   Respiratory: Negative.    Cardiovascular: Negative.   Gastrointestinal: Negative.   Genitourinary: Negative.   Musculoskeletal:  Positive for myalgias (left thigh and groin pain).  Neurological: Negative.       Objective:    Physical Exam Vitals and nursing note reviewed.  Constitutional:      General: She is not in acute distress.    Appearance: Normal appearance.  HENT:     Head: Normocephalic.  Eyes:     Conjunctiva/sclera: Conjunctivae normal.  Cardiovascular:     Rate and Rhythm: Normal rate and regular rhythm.     Pulses: Normal pulses.     Heart sounds: Normal heart sounds.  Pulmonary:     Effort: Pulmonary effort is normal.     Breath sounds: Normal breath sounds.  Musculoskeletal:        General: No tenderness. Normal range of motion.     Cervical back: Normal range of motion.     Right lower leg: No edema.     Left lower leg: No edema.     Comments: Full ROM in bilateral hips and lumbar spine. Strength 5/5 in bilateral lower extremities   Skin:    General: Skin is warm.  Neurological:     General: No focal deficit present.     Mental Status: She is alert and oriented to person, place, and time.  Psychiatric:        Mood and Affect: Mood normal.        Behavior: Behavior normal.        Thought Content: Thought content normal.        Judgment: Judgment normal.    BP 122/72   Pulse 76   Temp 99.2 F (37.3 C) (Oral)   Wt 215 lb 6.4 oz (97.7 kg)   SpO2 97%   BMI 34.17 kg/m  Wt Readings from Last 3 Encounters:  12/10/20 215 lb 6.4 oz (97.7 kg)  11/26/20 215 lb 8 oz (97.8 kg)  10/15/20 216 lb  (98 kg)  Health Maintenance Due  Topic Date Due   PAP SMEAR-Modifier  09/23/2017   COVID-19 Vaccine (4 - Booster for Pfizer series) 08/29/2020   INFLUENZA VACCINE  12/08/2020    There are no preventive care reminders to display for this patient.   Lab Results  Component Value Date   TSH 2.890 11/26/2020   Lab Results  Component Value Date   WBC 7.3 11/26/2020   HGB 12.9 11/26/2020   HCT 38.3 11/26/2020   MCV 89 11/26/2020   PLT 385 11/26/2020   Lab Results  Component Value Date   NA 141 11/26/2020   K 3.7 11/26/2020   CO2 23 11/26/2020   GLUCOSE 152 (H) 11/26/2020   BUN 20 11/26/2020   CREATININE 0.67 11/26/2020   BILITOT 0.2 11/26/2020   ALKPHOS 153 (H) 11/26/2020   AST 28 11/26/2020   ALT 42 (H) 11/26/2020   PROT 7.3 11/26/2020   ALBUMIN 4.6 11/26/2020   CALCIUM 9.7 11/26/2020   ANIONGAP 10 11/18/2014   EGFR 98 11/26/2020   Lab Results  Component Value Date   CHOL 208 (H) 11/26/2020   Lab Results  Component Value Date   HDL 63 11/26/2020   Lab Results  Component Value Date   LDLCALC 123 (H) 11/26/2020   Lab Results  Component Value Date   TRIG 126 11/26/2020   Lab Results  Component Value Date   CHOLHDL 3.3 11/26/2020   Lab Results  Component Value Date   HGBA1C 6.3 (H) 11/26/2020       Assessment & Plan:   Problem List Items Addressed This Visit   None Visit Diagnoses     Muscle strain of left thigh, initial encounter    -  Primary   Muscle strain after exercise. Will treat with ibuprofen and tizanidine prn. Continue heat and epsom salt bath. Stretches given. F/U if symptoms don't improve        Meds ordered this encounter  Medications   tiZANidine (ZANAFLEX) 4 MG tablet    Sig: Take 1 tablet (4 mg total) by mouth every 6 (six) hours as needed for muscle spasms.    Dispense:  30 tablet    Refill:  0     Charyl Dancer, NP

## 2020-12-15 ENCOUNTER — Ambulatory Visit: Payer: BLUE CROSS/BLUE SHIELD

## 2020-12-30 ENCOUNTER — Ambulatory Visit
Admission: RE | Admit: 2020-12-30 | Discharge: 2020-12-30 | Disposition: A | Discharge status: Home / Self Care | Payer: BLUE CROSS/BLUE SHIELD | Source: Ambulatory Visit | Attending: Obstetrics and Gynecology | Admitting: Obstetrics and Gynecology

## 2020-12-30 ENCOUNTER — Other Ambulatory Visit: Payer: Self-pay

## 2020-12-30 DIAGNOSIS — Z1231 Encounter for screening mammogram for malignant neoplasm of breast: Secondary | ICD-10-CM | POA: Diagnosis not present

## 2021-01-01 ENCOUNTER — Encounter: Payer: Self-pay | Admitting: Nurse Practitioner

## 2021-01-01 ENCOUNTER — Telehealth: Payer: BLUE CROSS/BLUE SHIELD | Admitting: Nurse Practitioner

## 2021-01-01 DIAGNOSIS — B353 Tinea pedis: Secondary | ICD-10-CM | POA: Diagnosis not present

## 2021-01-01 DIAGNOSIS — T148XXA Other injury of unspecified body region, initial encounter: Secondary | ICD-10-CM

## 2021-01-01 MED ORDER — KETOCONAZOLE 2 % EX CREA: 1.0000 "application " | TOPICAL_CREAM | Freq: Every day | CUTANEOUS | 5 refills | Status: DC

## 2021-01-01 NOTE — Progress Notes (Signed)
There were no vitals taken for this visit.   Subjective:    Patient ID: Michaela Hardy, female    DOB: 10/05/1955, 65 y.o.   MRN: [id number]  HPI: Michaela Hardy is a 65 y.o. female  Chief Complaint  Patient presents with   Leg Pain    Pt states she is still having L leg pain. States the stretches she was given at her previous visit did not help    Patient seen today following her visit on 12/10/2020 for leg pain.  Patient was given exercises.  And treated with ibuprofen and tizanidine.  Patient states the right one no longer hurts but her left one is still painful.  She states the exercises she was given during the last visit didn't really help her symptoms. Patient states that if she walks a lot she is limping due to the pain.  Patient states the pain is on the outside of her leg where her his is. Patient states the pain is more of an ache and not painful to touch.    Relevant past medical, surgical, family and social history reviewed and updated as indicated. Interim medical history since our last visit reviewed. Allergies and medications reviewed and updated.  Review of Systems  Musculoskeletal:        Left him pain   Per HPI unless specifically indicated above     Objective:    There were no vitals taken for this visit.  Wt Readings from Last 3 Encounters:  12/10/20 215 lb 6.4 oz (97.7 kg)  11/26/20 215 lb 8 oz (97.8 kg)  10/15/20 216 lb (98 kg)    Physical Exam Vitals and nursing note reviewed.  Constitutional:      General: She is not in acute distress.    Appearance: She is not ill-appearing.  HENT:     Head: Normocephalic.     Right Ear: Hearing normal.     Left Ear: Hearing normal.     Nose: Nose normal.  Eyes:     Pupils: Pupils are equal, round, and reactive to light.  Pulmonary:     Effort: Pulmonary effort is normal. No respiratory distress.  Neurological:     Mental Status: She is alert.  Psychiatric:        Mood and Affect: Mood normal.         Behavior: Behavior normal.        Thought Content: Thought content normal.        Judgment: Judgment normal.    Results for orders placed or performed in visit on 11/26/20  Microscopic Examination   Urine  Result Value Ref Range   WBC, UA 0-5 0 - 5 /hpf   RBC None seen 0 - 2 /hpf   Epithelial Cells (non renal) None seen 0 - 10 /hpf   Bacteria, UA Few (A) None seen/Few  CBC with Differential/Platelet  Result Value Ref Range   WBC 7.3 3.4 - 10.8 x10E3/uL   RBC 4.29 3.77 - 5.28 x10E6/uL   Hemoglobin 12.9 11.1 - 15.9 g/dL   Hematocrit 38.3 34.0 - 46.6 %   MCV 89 79 - 97 fL   MCH 30.1 26.6 - 33.0 pg   MCHC 33.7 31.5 - 35.7 g/dL   RDW 12.7 11.7 - 15.4 %   Platelets 385 150 - 450 x10E3/uL   Neutrophils 67 Not Estab. %   Lymphs 21 Not Estab. %   Monocytes 10 Not Estab. %   Eos 1 Not  Estab. %   Basos 1 Not Estab. %   Neutrophils Absolute 4.9 1.4 - 7.0 x10E3/uL   Lymphocytes Absolute 1.6 0.7 - 3.1 x10E3/uL   Monocytes Absolute 0.7 0.1 - 0.9 x10E3/uL   EOS (ABSOLUTE) 0.1 0.0 - 0.4 x10E3/uL   Basophils Absolute 0.1 0.0 - 0.2 x10E3/uL   Immature Granulocytes 0 Not Estab. %   Immature Grans (Abs) 0.0 0.0 - 0.1 x10E3/uL  Comprehensive metabolic panel  Result Value Ref Range   Glucose 152 (H) 65 - 99 mg/dL   BUN 20 8 - 27 mg/dL   Creatinine, Ser 0.67 0.57 - 1.00 mg/dL   eGFR 98 >59 mL/min/1.73   BUN/Creatinine Ratio 30 (H) 12 - 28   Sodium 141 134 - 144 mmol/L   Potassium 3.7 3.5 - 5.2 mmol/L   Chloride 100 96 - 106 mmol/L   CO2 23 20 - 29 mmol/L   Calcium 9.7 8.7 - 10.3 mg/dL   Total Protein 7.3 6.0 - 8.5 g/dL   Albumin 4.6 3.8 - 4.8 g/dL   Globulin, Total 2.7 1.5 - 4.5 g/dL   Albumin/Globulin Ratio 1.7 1.2 - 2.2   Bilirubin Total 0.2 0.0 - 1.2 mg/dL   Alkaline Phosphatase 153 (H) 44 - 121 IU/L   AST 28 0 - 40 IU/L   ALT 42 (H) 0 - 32 IU/L  Lipid panel  Result Value Ref Range   Cholesterol, Total 208 (H) 100 - 199 mg/dL   Triglycerides 126 0 - 149 mg/dL   HDL 63 >39 mg/dL    VLDL Cholesterol Cal 22 5 - 40 mg/dL   LDL Chol Calc (NIH) 123 (H) 0 - 99 mg/dL   Chol/HDL Ratio 3.3 0.0 - 4.4 ratio  TSH  Result Value Ref Range   TSH 2.890 0.450 - 4.500 uIU/mL  Urinalysis, Routine w reflex microscopic  Result Value Ref Range   Specific Gravity, UA >1.030 (H) 1.005 - 1.030   pH, UA 5.5 5.0 - 7.5   Color, UA Yellow Yellow   Appearance Ur Cloudy (A) Clear   Leukocytes,UA 1+ (A) Negative   Protein,UA Negative Negative/Trace   Glucose, UA Negative Negative   Ketones, UA Negative Negative   RBC, UA Negative Negative   Bilirubin, UA Negative Negative   Urobilinogen, Ur 0.2 0.2 - 1.0 mg/dL   Nitrite, UA Negative Negative   Microscopic Examination See below:   HgB A1c  Result Value Ref Range   Hgb A1c MFr Bld 6.3 (H) 4.8 - 5.6 %   Est. average glucose Bld gHb Est-mCnc 134 mg/dL  HIV Antibody (routine testing w rflx)  Result Value Ref Range   HIV Screen 4th Generation wRfx Non Reactive Non Reactive      Assessment & Plan:   Problem List Items Addressed This Visit   None Visit Diagnoses     Muscle strain    -  Primary   Continue with stretches given at previous visit. Will give referral for physical therapy. Can continue with ibuprofen PRN. FU in office if symptoms worsen.   Relevant Orders   Ambulatory referral to Physical Therapy   Tinea pedis of both feet       Relevant Medications   ketoconazole (NIZORAL) 2 % cream        Follow up plan: No follow-ups on file.   This visit was completed via MyChart due to the restrictions of the COVID-19 pandemic. All issues as above were discussed and addressed. Physical exam was done as above through  visual confirmation on MyChart. If it was felt that the patient should be evaluated in the office, they were directed there. The patient verbally consented to this visit. Location of the patient: Home Location of the provider: Office Those involved with this call:  Provider: Jon Billings, NP CMA: Yvonna Alanis, [place] Desk/Registration: Elizabeth Palau, CMA This encounter was conducted via video.  I spent 15 dedicated to the care of this patient on the date of this encounter to include previsit review of 20, face to face time with the patient, and post visit ordering of testing.

## 2021-01-02 ENCOUNTER — Other Ambulatory Visit: Payer: Self-pay | Admitting: Obstetrics and Gynecology

## 2021-01-02 DIAGNOSIS — R928 Other abnormal and inconclusive findings on diagnostic imaging of breast: Secondary | ICD-10-CM

## 2021-01-13 ENCOUNTER — Other Ambulatory Visit: Payer: Self-pay

## 2021-01-13 ENCOUNTER — Ambulatory Visit: Payer: BLUE CROSS/BLUE SHIELD | Attending: Nurse Practitioner | Admitting: Physical Therapy

## 2021-01-13 VITALS — BP 144/76 | HR 84

## 2021-01-13 DIAGNOSIS — M25552 Pain in left hip: Secondary | ICD-10-CM

## 2021-01-13 DIAGNOSIS — R262 Difficulty in walking, not elsewhere classified: Secondary | ICD-10-CM

## 2021-01-13 NOTE — Therapy (Signed)
[facility] [address] [place], [address] Phone: [phone]   Fax:  [phone]  Physical Therapy Evaluation  Patient Details  Name: Michaela Hardy MRN: [id number] Date of Birth: 12-26-1955 Referring Provider (PT): Jon Billings, NP   Encounter Date: 01/13/2021   PT End of Session - 01/13/21 1647     Visit Number 1    Number of Visits 10    Date for PT Re-Evaluation 03/17/21    PT Start Time 49    PT Stop Time [id number]    PT Time Calculation (min) 45 min    Activity Tolerance Patient tolerated treatment well    Behavior During Therapy [facility] for tasks assessed/performed             Past Medical History:  Diagnosis Date   Anxiety    Depression    GERD (gastroesophageal reflux disease)    Hepatitis    Primary bilary cholangitis   Hyperlipidemia    Hypertension    PONV (postoperative nausea and vomiting)     Past Surgical History:  Procedure Laterality Date   APPENDECTOMY  1976   BREAST BIOPSY Left 10/03/2014   Dr. Autumn Patty   BREAST EXCISIONAL BIOPSY Left    BREAST LUMPECTOMY WITH RADIOACTIVE SEED LOCALIZATION Left 11/21/2014   Procedure: LEFT BREAST LUMPECTOMY WITH RADIOACTIVE SEED LOCALIZATION;  Surgeon: Autumn Messing III, MD;  Location: [facility];  Service: General;  Laterality: Left;   CERVICAL BIOPSY  W/ LOOP ELECTRODE EXCISION     CHOLECYSTECTOMY  06/29/2007   LIVER BIOPSY  2008   TUBAL LIGATION  1987    Vitals:   01/13/21 1650  BP: (!) 144/76  Pulse: 84      Subjective Assessment - 01/13/21 1635     Subjective Pt reports that the pain in her legs started at water aerobics and she did a complicated exercises. The next day she felt incredibly sore especially in both of her legs. She went to her PCP and she did exercises prescribed by NP but they did not help. More than 6 years ago she hurt her left hip and she was given a stretch by her chiropractor which helped  resolve issue after stretching.    Pertinent History Per Jon Billings NP Patient seen today following her visit on 12/10/2020 for leg pain.  Patient was given exercises.  And treated with ibuprofen and tizanidine.  Patient states the right one no longer hurts but her left one is still painful.  She states the exercises she was given during the last visit didn't really help her symptoms. Patient states that if she walks a lot she is limping due to the pain.  Patient states the pain is on the outside of her leg where her his is. Patient states the pain is more of an ache and not painful to touch.    Limitations Walking    How long can you sit comfortably? No limits here    How long can you stand comfortably? No limits here    How long can you walk comfortably? Just trying to walk to grocery store and back to car is hurting her    Diagnostic tests None    Patient Stated Goals Wants the left hip pain to go away and to function normally    Currently in Pain? No/denies                 [facility] PT Assessment - 01/13/21  0001       Assessment   Medical Diagnosis left hip pain    Referring Provider (PT) Jon Billings, NP    Next MD Visit Unsure    Prior Therapy None      Home Environment   Living Environment Private residence    Type of [place] Access Stairs to enter    Entrance Stairs-Number of Steps 4    Entrance Stairs-Rails Can reach both;Left;Right    Home Layout Multi-level    Alternate Level Stairs-Number of Steps 6    Alternate Level Stairs-Rails Right      Prior Function   Level of Independence Independent      Cognition   Overall Cognitive Status Within Functional Limits for tasks assessed             - Cauda Equina Syndrome: Negative to all  Bladder/bowel dysfunction Saddle anesthesia  Sexual dysfunction Possible neurological deficits in the lower limb (motor or sensory loss, reflex change)   OBJECTIVE:   Observation: Palpation: TTP on  posterior side of left ASIS  Squat: No pain with knees coming over toes    Lumbar AROM:         NORMS: Flexion:        60               60 deg  Extension:    25               25 deg Sidebend R: 35               35 deg Sidebend L:  35              35 deg Rotation R:   45             45 deg Rotation L:    45            45 deg  Hip          AROM:     NORMS: Flexion:     120        120 deg Extension:   20        20 deg Abd:            40         40 deg ER:              45         45 deg IR:               45         45 deg   Hip Strength:        R         L Hip flexion:           5/5       5/5 Hip abduction:      4/5       4/5 Hip adduction:      NT       NT Hip extension:      4/5       4/5 Hip ER:                 NT       NT Hip IR:                   NT        NT     Special Tests:  -  Rule out SIJ (cluster - 3/5): Negative Thigh Thurst  Thigh thrust              (-B)  - Rule out Lumbar Spine Slump Test                NT  SLR                       (- B)  - GTPS: Trendelenburg Sign        (-B) Resisted External Derotation Test (-B)  - Muscle Length Testing: Thomas test               (- B) Ober's test               (-B) Ely's Test                  (+B)  - Intra-articular pathology: Hip Scour test            (-B) FABER test                (-B)  - Impingement/Labral tear: FADDIR test               (-B) Flexion-IR test           (-B)  Functional Tests: Squat (See Top) Single Leg Stance- NT  Sit to stand-Normal   THEREX:  Prone Quad Stretches 2 x 60 sec  Sidelying IT band stretch 2 x 60 sec  -min VC to stop anterior trunk lean  Supine Bridges 2 x 10     PT Education - 01/13/21 1646     Education Details form/technique with exercise    Person(s) Educated Patient    Methods Explanation    Comprehension Returned demonstration;Verbal cues required;Verbalized understanding             HEP includes the following exercises:   Access Code:  [email or web address] URL: [email or web address] Date: 01/13/2021 Prepared by: Bradly Chris  Exercises Sidelying ITB Stretch off Table - 1 x daily - 7 x weekly - 1 sets - 5 reps - 60 hold Prone Quadriceps Stretch with Strap - 1 x daily - 7 x weekly - 1 sets - 5 reps - 60 hold Supine Bridge - 1 x daily - 3 x weekly - 3 sets - 10 reps       Plan - 01/13/21 1734     Clinical Impression Statement Pt is a 65 yo female that presents with left hip pain after aerobic exercise session. Pt demonstrates decreased hip strength and flexibility and likely pain generation from TFL with relief of pain with sidelying IT band stretch. She will benefit from skilled PT to increase her hip strength and flexibility and to decrease hip pain to return to cleaning her house and grocery shopping without experiencing an increase in her pain.    Personal Factors and Comorbidities Comorbidity 3+;Past/Current Experience    Comorbidities GERD, HTN, HLD, Anxiety, Depression    Examination-Activity Limitations Bend;Locomotion Level    Examination-Participation Restrictions Cleaning;Other   Shopping   Stability/Clinical Decision Making Stable/Uncomplicated    Clinical Decision Making Low    Rehab Potential Good    PT Frequency 1x / week    PT Duration Other (comment)   9 weeks   PT Treatment/Interventions Gait training;Moist Heat;Joint Manipulations;Manual techniques;Therapeutic activities;Therapeutic exercise;Neuromuscular re-education;Balance training;Cryotherapy;Dry needling    PT Next Visit Plan Progress hip strengthening and hip stretches    PT Home Exercise Plan  [email or web address]    Consulted and Agree with Plan of Care Patient            HEP includes following exercises:  Access Code: [email or web address] URL: [email or web address] Date: 01/13/2021 Prepared by: Bradly Chris  Exercises Sidelying ITB Stretch off Table - 1 x daily - 7 x weekly - 1 sets - 5 reps - 60 hold Prone Quadriceps Stretch with  Strap - 1 x daily - 7 x weekly - 1 sets - 5 reps - 60 hold Supine Bridge - 1 x daily - 3 x weekly - 3 sets - 10 reps  Patient will benefit from skilled therapeutic intervention in order to improve the following deficits and impairments:  Decreased strength, Pain, Impaired flexibility, Difficulty walking  Visit Diagnosis: Pain in left hip  Difficulty in walking, not elsewhere classified     Problem List Patient Active Problem List   Diagnosis Date Noted   Prediabetes 11/26/2020   Disease of liver 05/25/2019   Biliary cirrhosis ([place]) 05/25/2019   Elevated blood sugar 10/20/2015   Allergic sinusitis 01/14/2015   Eustachian tube dysfunction 01/14/2015   Primary biliary cholangitis ([place]) 11/15/2014   Other migraine, not intractable, without status migrainosus 08/10/2014   Depressive disorder 08/10/2014   Acid reflux 08/10/2014   Other specified bacterial intestinal infections 08/10/2014   Hypercholesteremia 08/10/2014   Hypertensive disorder 08/10/2014   Insomnia 08/10/2014   Gastro-esophageal reflux disease without esophagitis 08/10/2014   Major depressive disorder, single episode 08/10/2014   Bradly Chris PT, DPT  01/13/2021, 5:41 PM  [facility] [address] [place], [address] Phone: [phone]   Fax:  [phone]  Name: Michaela Hardy MRN: [id number] Date of Birth: 11/07/1955

## 2021-01-15 ENCOUNTER — Encounter: Payer: BLUE CROSS/BLUE SHIELD | Admitting: Physical Therapy

## 2021-01-22 ENCOUNTER — Ambulatory Visit: Payer: BLUE CROSS/BLUE SHIELD

## 2021-01-26 ENCOUNTER — Ambulatory Visit: Payer: BLUE CROSS/BLUE SHIELD | Admitting: Physical Therapy

## 2021-01-27 ENCOUNTER — Other Ambulatory Visit: Payer: BLUE CROSS/BLUE SHIELD

## 2021-01-29 ENCOUNTER — Encounter: Payer: BLUE CROSS/BLUE SHIELD | Admitting: Physical Therapy

## 2021-02-03 ENCOUNTER — Ambulatory Visit: Payer: BLUE CROSS/BLUE SHIELD | Admitting: Physical Therapy

## 2021-02-03 DIAGNOSIS — M25552 Pain in left hip: Secondary | ICD-10-CM

## 2021-02-03 DIAGNOSIS — R262 Difficulty in walking, not elsewhere classified: Secondary | ICD-10-CM | POA: Diagnosis not present

## 2021-02-03 NOTE — Therapy (Signed)
[facility] [address], [place], [address] Phone: [phone]   Fax:  [phone]  Physical Therapy Treatment  Patient Details  Name: Michaela Hardy MRN: [id number] Date of Birth: January 20, 1956 Referring Provider (PT): Jon Billings, NP   Encounter Date: 02/03/2021   PT End of Session - 02/03/21 0917     Visit Number 2    Number of Visits 10    Date for PT Re-Evaluation 03/17/21    PT Start Time 0845    PT Stop Time 0930    PT Time Calculation (min) 45 min    Activity Tolerance Patient tolerated treatment well    Behavior During Therapy [facility] for tasks assessed/performed             Past Medical History:  Diagnosis Date   Anxiety    Depression    GERD (gastroesophageal reflux disease)    Hepatitis    Primary bilary cholangitis   Hyperlipidemia    Hypertension    PONV (postoperative nausea and vomiting)     Past Surgical History:  Procedure Laterality Date   APPENDECTOMY  1976   BREAST BIOPSY Left 10/03/2014   Dr. Autumn Patty   BREAST EXCISIONAL BIOPSY Left    BREAST LUMPECTOMY WITH RADIOACTIVE SEED LOCALIZATION Left 11/21/2014   Procedure: LEFT BREAST LUMPECTOMY WITH RADIOACTIVE SEED LOCALIZATION;  Surgeon: Autumn Messing III, MD;  Location: [facility];  Service: General;  Laterality: Left;   CERVICAL BIOPSY  W/ LOOP ELECTRODE EXCISION     CHOLECYSTECTOMY  06/29/2007   LIVER BIOPSY  2008   TUBAL LIGATION  1987    There were no vitals filed for this visit.   Subjective Assessment - 02/03/21 0852     Subjective Pt reports that her left leg has improved a lot especially with the stretches.    Pertinent History Per Jon Billings NP Patient seen today following her visit on 12/10/2020 for leg pain.  Patient was given exercises.  And treated with ibuprofen and tizanidine.  Patient states the right one no longer hurts but her left one is still painful.  She states the exercises she was  given during the last visit didn't really help her symptoms. Patient states that if she walks a lot she is limping due to the pain.  Patient states the pain is on the outside of her leg where her his is. Patient states the pain is more of an ache and not painful to touch.    Limitations Walking    How long can you sit comfortably? No limits here    How long can you stand comfortably? No limits here    How long can you walk comfortably? Just trying to walk to grocery store and back to car is hurting her    Diagnostic tests None    Patient Stated Goals Wants the left hip pain to go away and to function normally    Currently in Pain? No/denies            Hip Abduction 4/5 4/5 Hip IR            5/5   5/5 Hip ER          5/5    5/5      THEREX:   Standing Hip Abduction 3 x 10 with 1 UE support   Mini-Squat to 45 degrees 3 x 10   Standing Marches 3 x 10   Standing Adductor Stretch  on LLE 5 x 30 sec   Prone Quad Stretch on LLE 5 x 30 sec    Updated HEP and educated patient on changes to exercises and addition of new exercises that include standing adductor stretch, prone quad stretch, mini-squat, standing hip abduction, and standing marches.                       PT Long Term Goals - 02/03/21 0920       PT LONG TERM GOAL #1   Title Patient will have improved function and activity level as evidenced by an increase in FOTO score by 10 points or more.    Baseline 9/6: 72/81    Time 9    Period Weeks    Status New      PT LONG TERM GOAL #2   Title Patient will be able to complete grocery shopping without experiencing hip pain that is >=4 out of 10.    Baseline 9/6: NPS 7/10    Time 9    Period Weeks    Status New      PT LONG TERM GOAL #3   Title Patient will be able to clean her house without experiencing an increase in her hip pain that is >=4 out of 10.    Baseline 9/6: 7/10    Time 9    Period Weeks    Status New            HEP includes the  following:  Access Code: [email or web address] URL: [email or web address] Date: 02/03/2021 Prepared by: Bradly Chris  Exercises Sidelying ITB Stretch off Table - 1 x daily - 7 x weekly - 1 sets - 5 reps - 60 hold Seated Hip External Rotation Stretch - 1 x daily - 7 x weekly - 1 sets - 5 reps - 30 hold Side Lunge Adductor Stretch - 1 x daily - 7 x weekly - 1 sets - 5 reps - 30 hold Prone Quadriceps Stretch with Strap - 1 x daily - 7 x weekly - 1 sets - 5 reps - 60 hold Supine Bridge - 1 x daily - 3 x weekly - 3 sets - 10 reps Standing Hip Abduction with Counter Support - 1 x daily - 3 x weekly - 3 sets - 10 reps Mini Squat - 1 x daily - 3 x weekly - 3 sets - 10 reps Standing Hip Flexion March - 1 x daily - 3 x weekly - 3 sets - 10 reps Figure 4 Bridge - 1 x daily - 3 x weekly - 3 sets - 10 reps        Plan - 02/03/21 0918     Personal Factors and Comorbidities Comorbidity 3+;Past/Current Experience    Comorbidities GERD, HTN, HLD, Anxiety, Depression    Examination-Activity Limitations Bend;Locomotion Level    Examination-Participation Restrictions Cleaning;Other   Shopping   Stability/Clinical Decision Making Stable/Uncomplicated    Rehab Potential Good    PT Frequency 1x / week    PT Duration Other (comment)   9 weeks   PT Treatment/Interventions Gait training;Moist Heat;Joint Manipulations;Manual techniques;Therapeutic activities;Therapeutic exercise;Neuromuscular re-education;Balance training;Cryotherapy;Dry needling    PT Next Visit Plan Forward and lateral step downs, matrix hip strengthening machines    PT Home Exercise Plan [email or web address]    Consulted and Agree with Plan of Care Patient             Patient will benefit from skilled therapeutic intervention in order to improve the following deficits  and impairments:  Decreased strength, Pain, Impaired flexibility, Difficulty walking  Visit Diagnosis: Pain in left hip  Difficulty in walking, not elsewhere  classified     Problem List Patient Active Problem List   Diagnosis Date Noted   Prediabetes 11/26/2020   Disease of liver 05/25/2019   Biliary cirrhosis ([place]) 05/25/2019   Elevated blood sugar 10/20/2015   Allergic sinusitis 01/14/2015   Eustachian tube dysfunction 01/14/2015   Primary biliary cholangitis ([place]) 11/15/2014   Other migraine, not intractable, without status migrainosus 08/10/2014   Depressive disorder 08/10/2014   Acid reflux 08/10/2014   Other specified bacterial intestinal infections 08/10/2014   Hypercholesteremia 08/10/2014   Hypertensive disorder 08/10/2014   Insomnia 08/10/2014   Gastro-esophageal reflux disease without esophagitis 08/10/2014   Major depressive disorder, single episode 08/10/2014   Bradly Chris PT, DPT  02/03/2021, 10:09 AM  [facility] [address] [place], [address] Phone: [phone]   Fax:  [phone]  Name: Michaela Hardy MRN: [id number] Date of Birth: 11-Nov-1955

## 2021-02-05 ENCOUNTER — Encounter: Payer: BLUE CROSS/BLUE SHIELD | Admitting: Physical Therapy

## 2021-02-09 ENCOUNTER — Ambulatory Visit: Payer: BLUE CROSS/BLUE SHIELD | Attending: Nurse Practitioner

## 2021-02-09 DIAGNOSIS — R262 Difficulty in walking, not elsewhere classified: Secondary | ICD-10-CM | POA: Insufficient documentation

## 2021-02-09 DIAGNOSIS — M25552 Pain in left hip: Secondary | ICD-10-CM | POA: Insufficient documentation

## 2021-02-09 IMAGING — US US ABDOMEN COMPLETE
1 series · 14 of 25 positions shown · non-contrast
Comparison: 10/17/2018

CLINICAL DATA: Primary biliary cholangitis

EXAM:
ABDOMEN ULTRASOUND COMPLETE

[Series 1: us abdomen complete · 0.26mm/px · 14 of 67 slices shown]
[im 1/67]
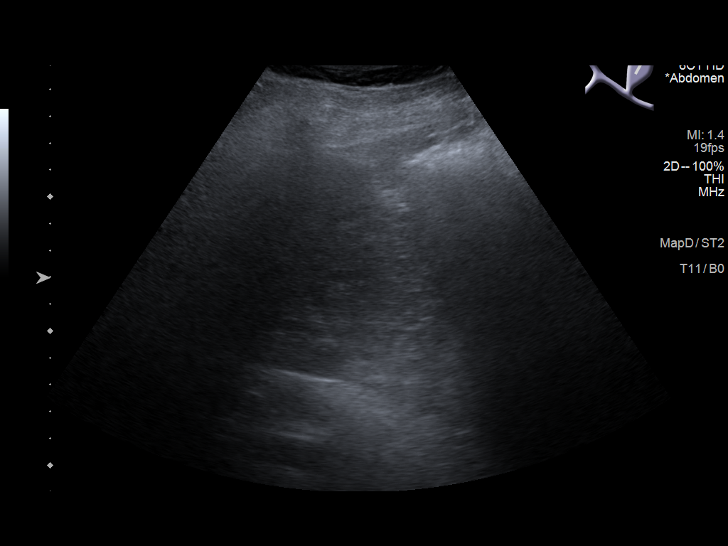
[im 6/67]
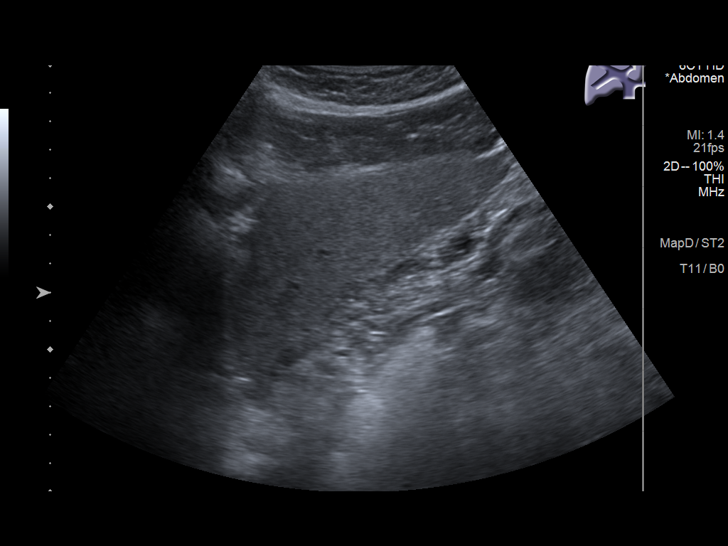
[im 12/67]
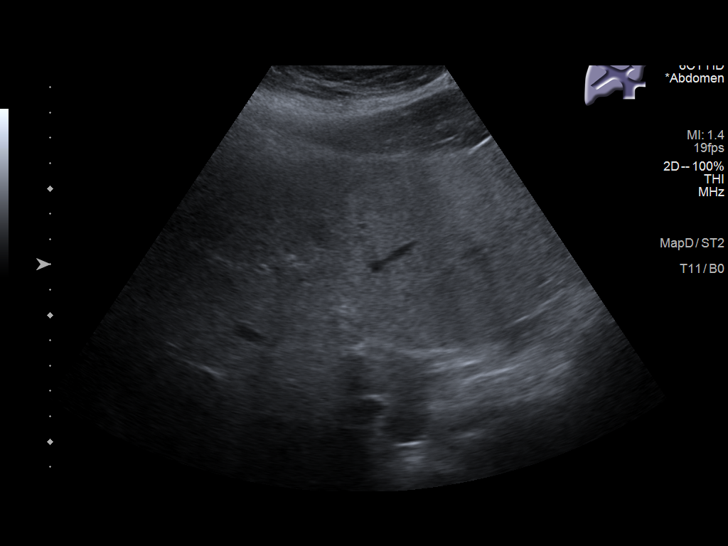
[im 17/67]
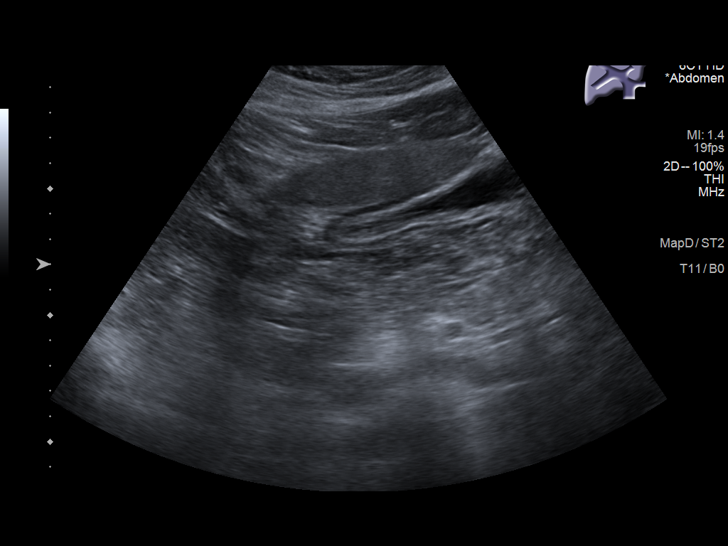
[im 23/67]
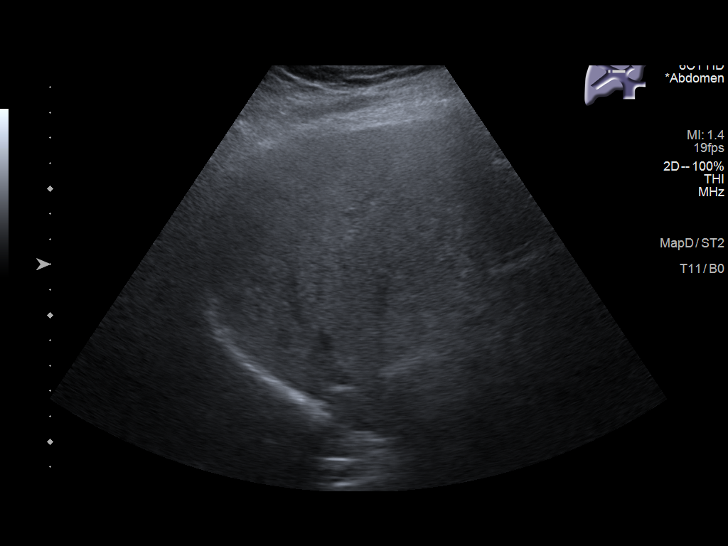
[im 25/67]
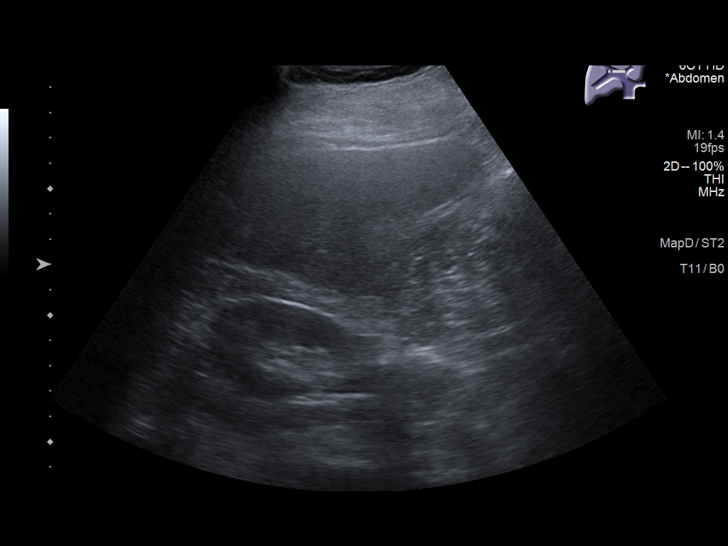
[im 31/67]
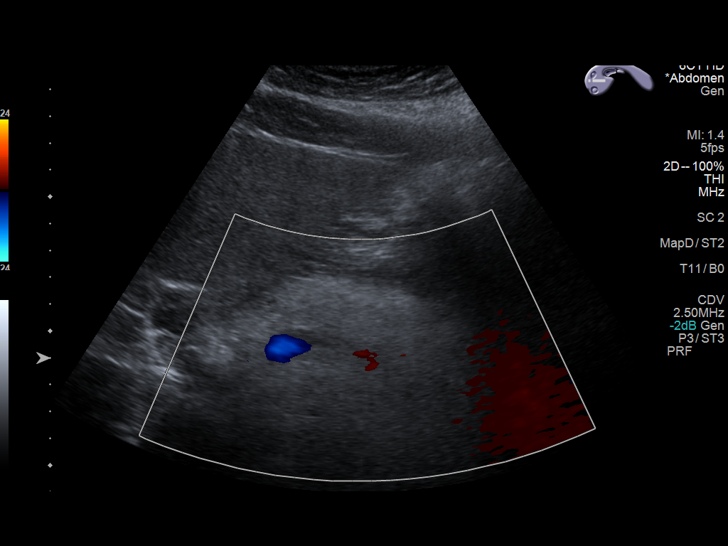
[im 36/67]
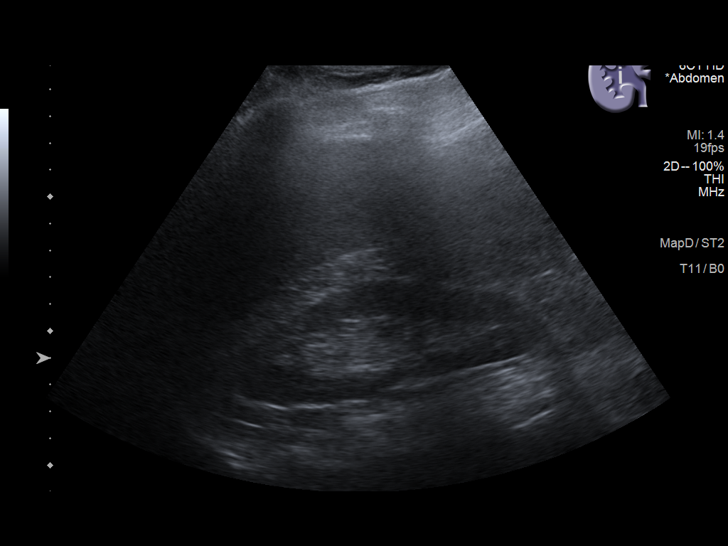
[im 42/67]
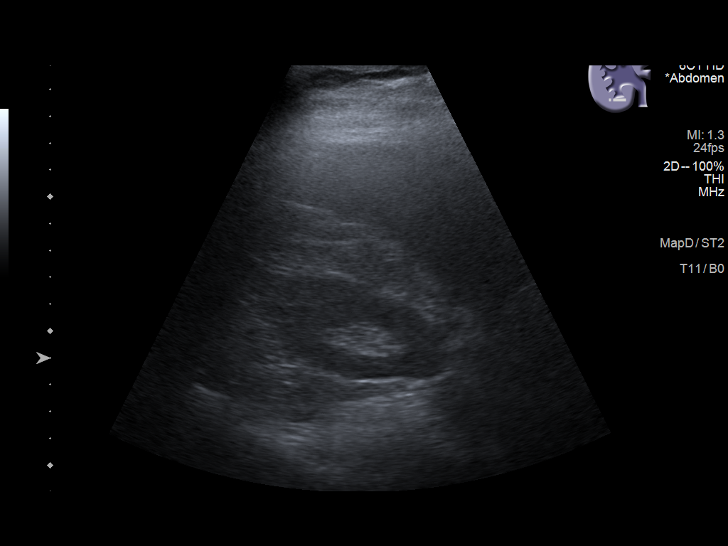
[im 45/67]
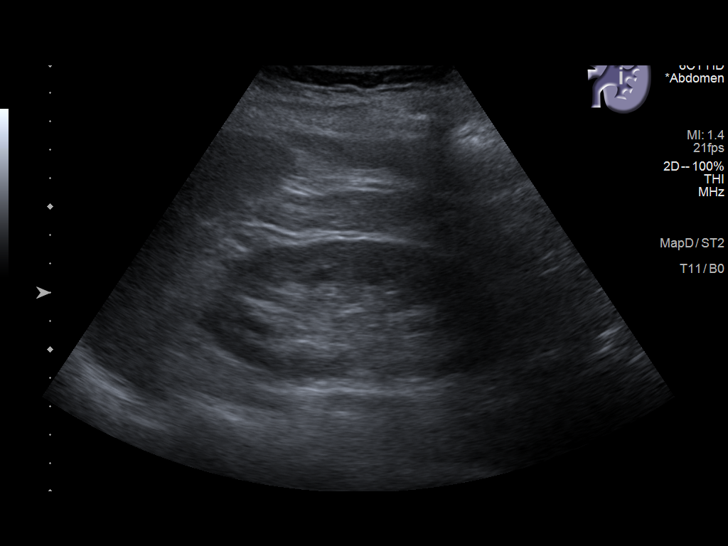
[im 50/67]
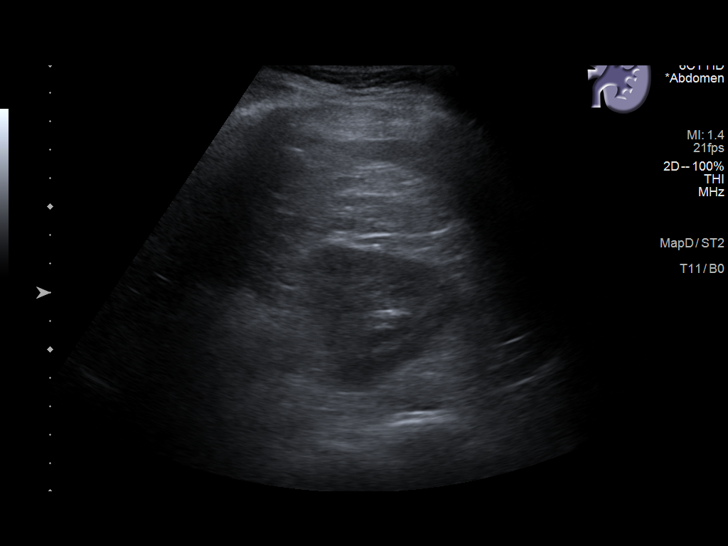
[im 56/67]
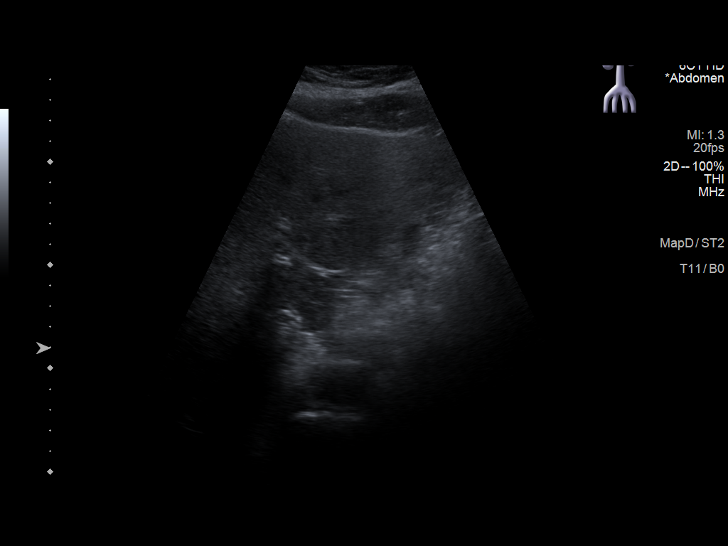
[im 61/67]
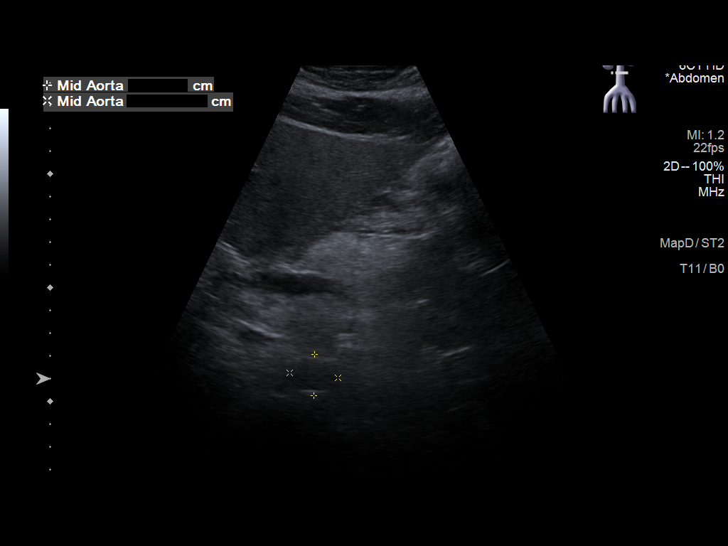
[im 67/67]
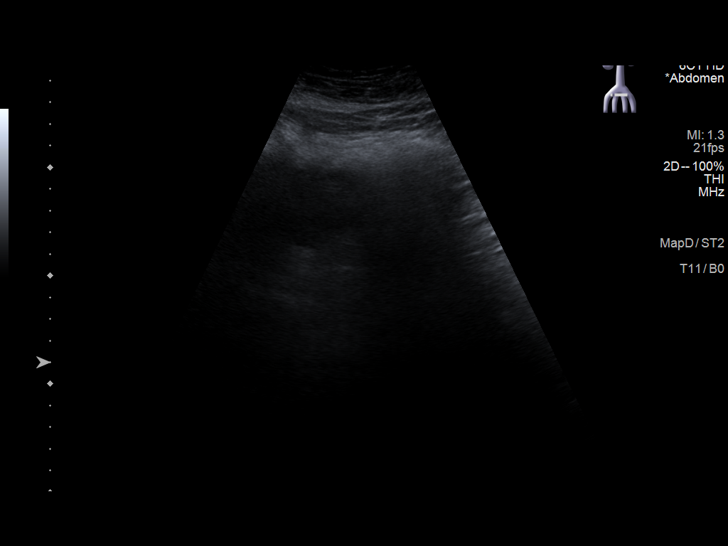

[14 of 25 positions shown; findings below may reference images not displayed]

FINDINGS: Gallbladder: Surgically absent

Common bile duct: Diameter: 9 mm, previously 6 mm

Liver: Echogenic parenchyma, likely fatty infiltration though this
can be seen with cirrhosis and certain infiltrative disorders. No
focal hepatic mass or nodularity. Portal vein patent with normal
direction of blood flow towards the liver.

IVC: Normal appearance

Pancreas: Fatty replacement without mass

Spleen: Normal appearance, 7.8 cm length

Right Kidney: Length: 11.9 cm. Normal morphology without mass or
hydronephrosis.

Left Kidney: Length: 10.6 cm. Normal morphology without mass or
hydronephrosis.

Abdominal aorta: Normal caliber

Other findings: No free fluid
IMPRESSION: Fatty infiltration of liver.

Prior cholecystectomy.

No acute abnormalities.

## 2021-02-09 IMAGING — US US PELVIS COMPLETE WITH TRANSVAGINAL
1 series · 13 of 25 positions shown · non-contrast
Comparison: None

CLINICAL DATA: Elevated alpha fetoprotein; postmenopausal



[Series 1: us pelvis complete with transvaginal · 0.23mm/px · 13 of 76 slices shown]
[im 1/76]
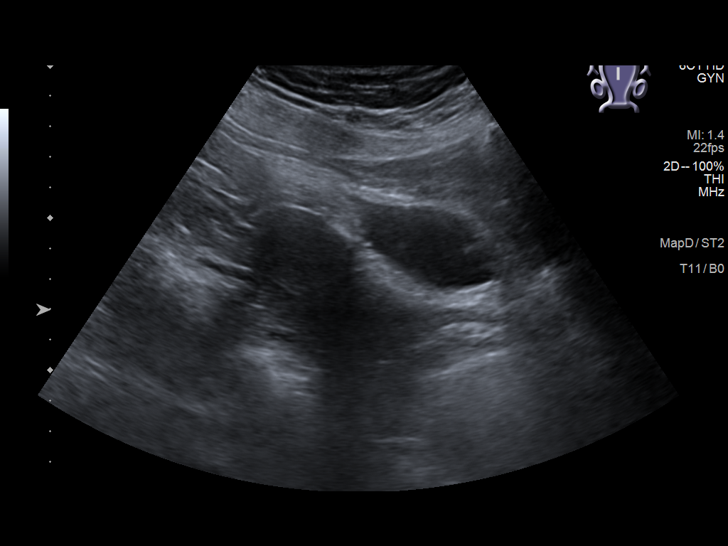
[im 7/76]
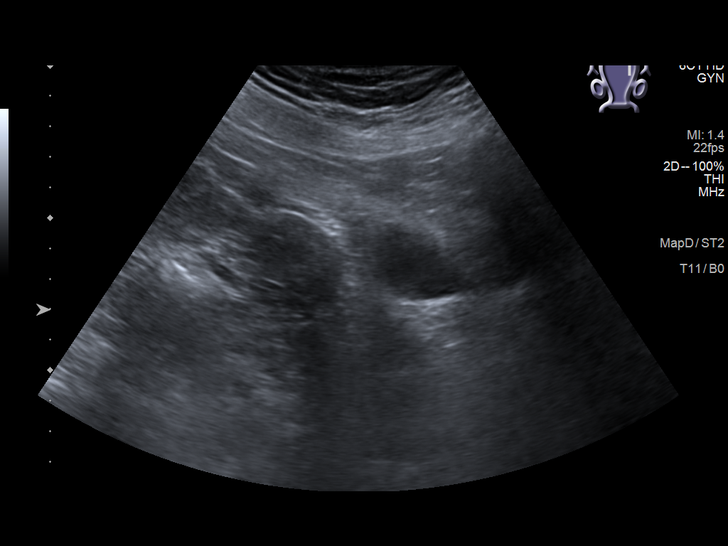
[im 13/76]
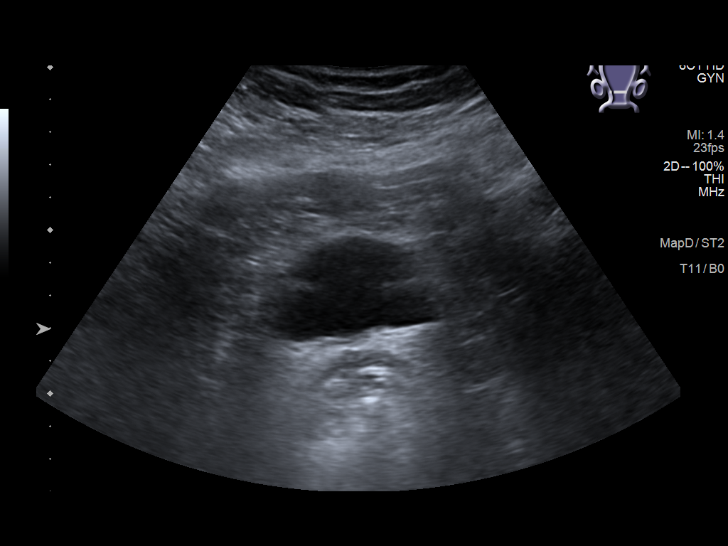
[im 19/76]
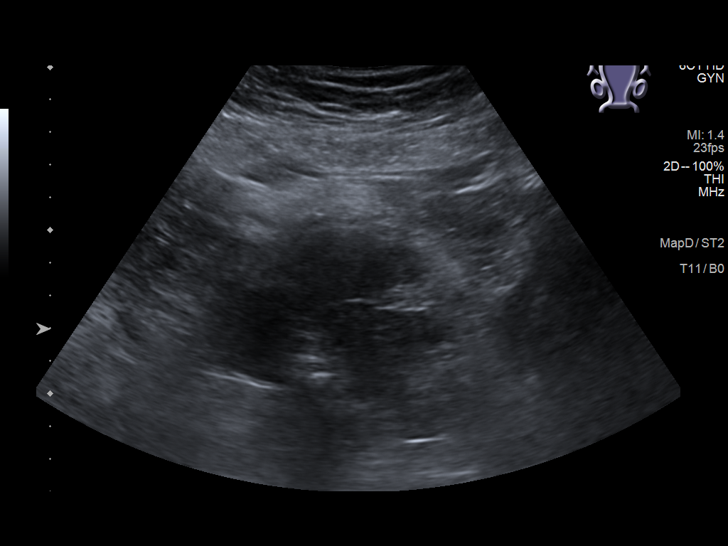
[im 26/76]
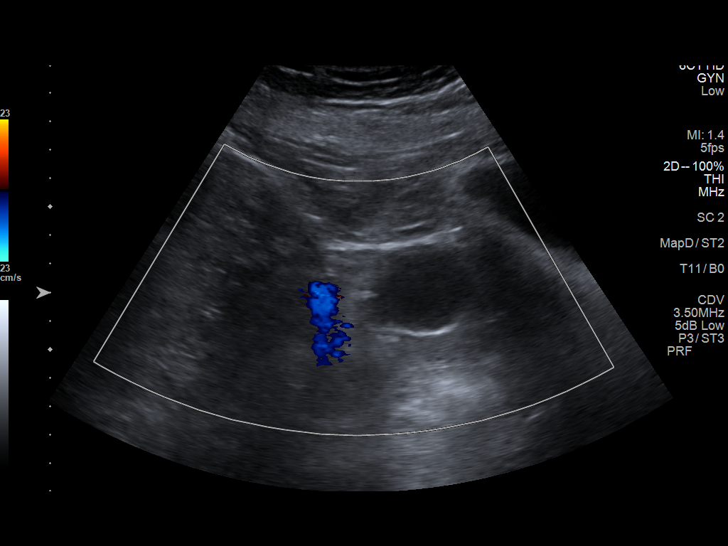
[im 32/76]
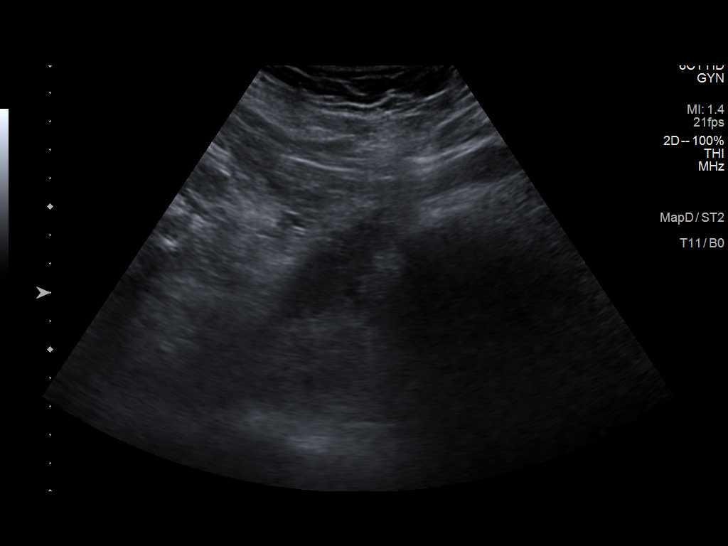
[im 38/76]
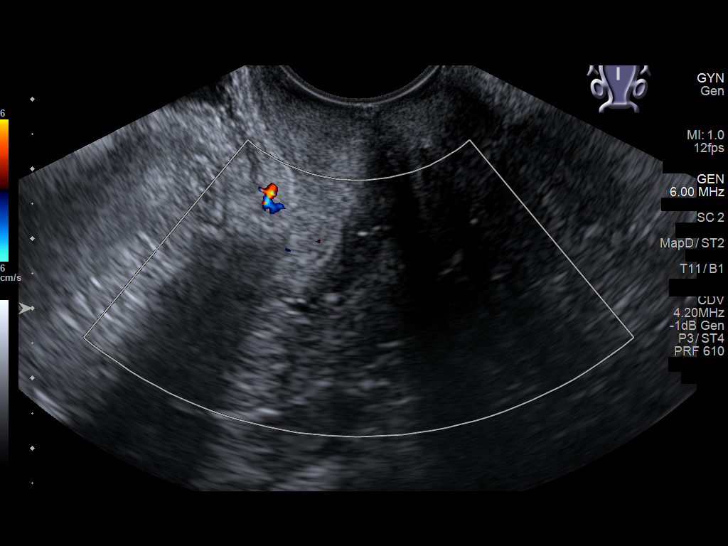
[im 44/76]
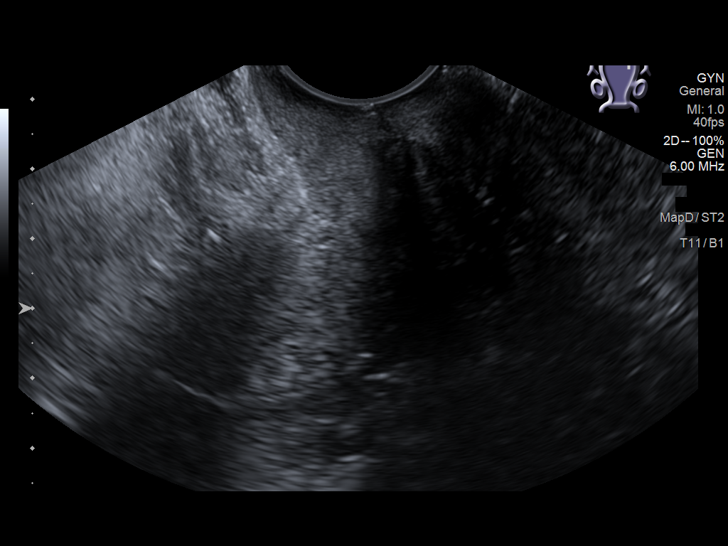
[im 51/76]
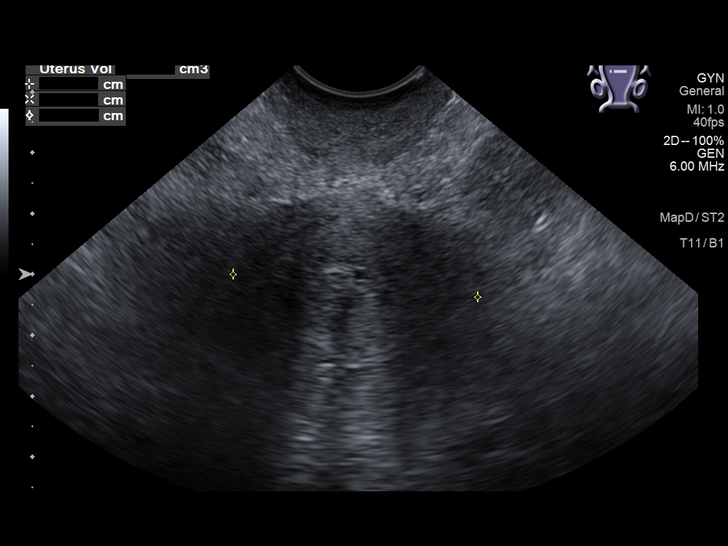
[im 57/76]
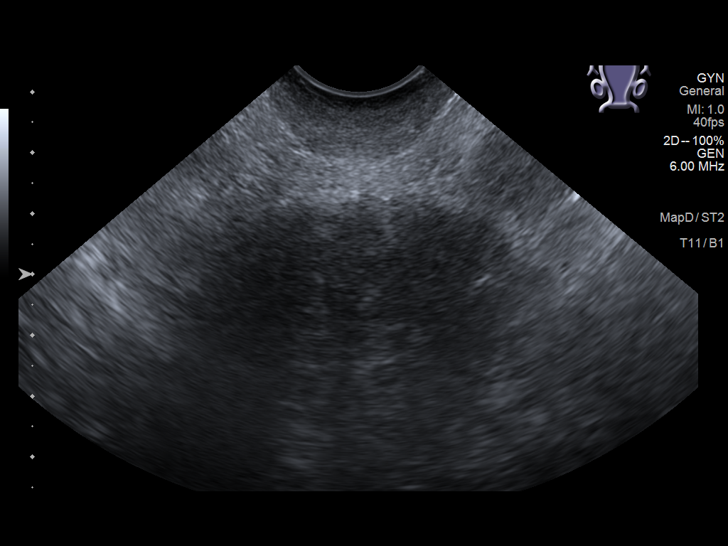
[im 63/76]
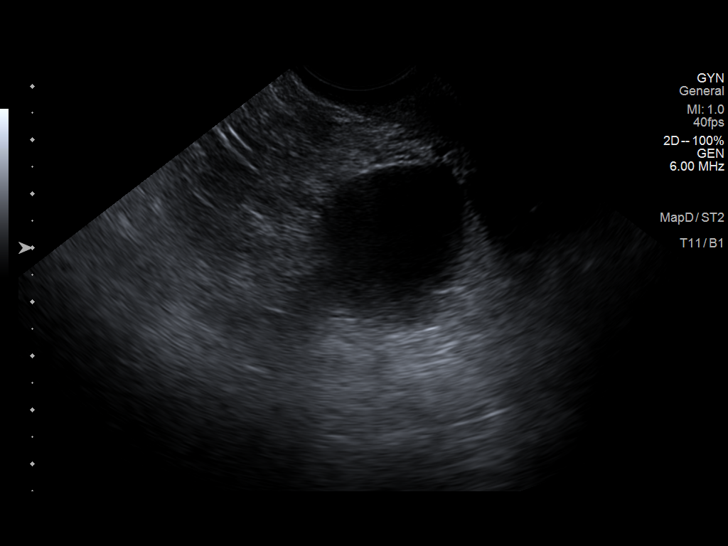
[im 69/76]
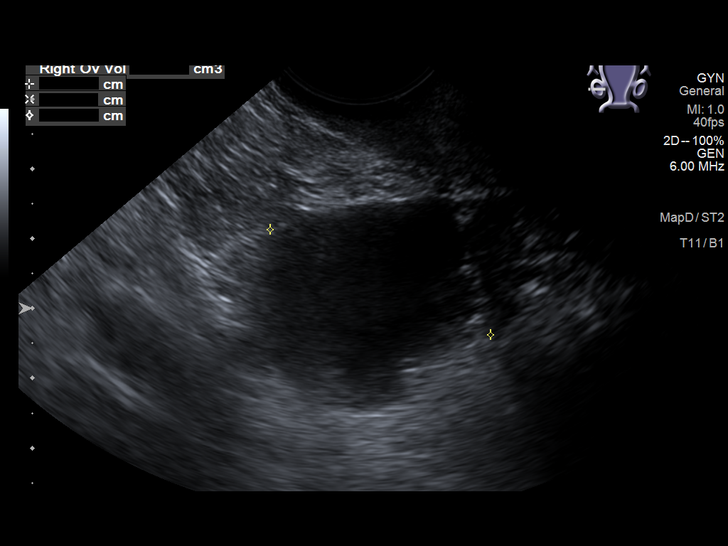
[im 76/76]
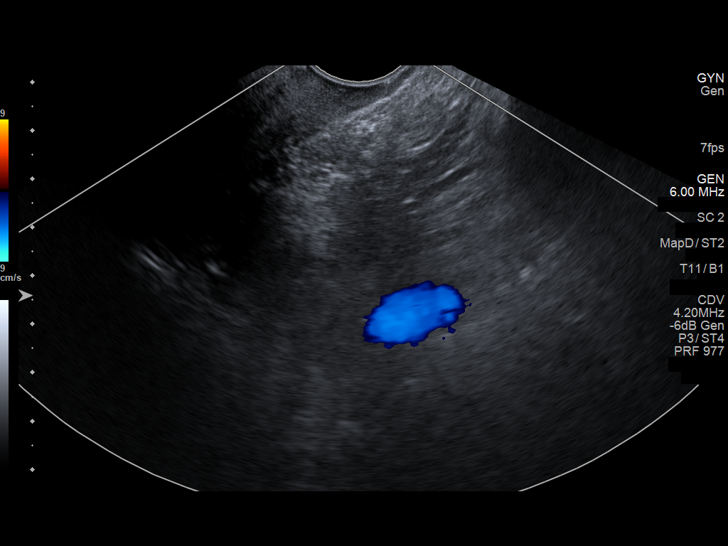

[13 of 25 positions shown; findings below may reference images not displayed]

FINDINGS: Uterus

Measurements: 6.7 x 2.9 x 4.7 cm = volume: 47.9 mL. Anteverted.
Normal morphology without mass

Endometrium

Thickness: 5 mm.  No endometrial fluid or focal abnormality.

Right ovary

Measurements: 4.4 x 3.3 x 3.5 cm = volume: 27 mL. Cyst within RIGHT
ovary 3.5 x 2.5 x 3.0 cm, containing a thin septation. No definite
mural nodularity

Left ovary

Not visualized, likely obscured by bowel

Other findings

No free pelvic fluid.  No adnexal masses.
IMPRESSION: Nonvisualization of LEFT ovary.

No significant uterine or endometrial abnormalities.

Cyst within RIGHT ovary 3.5 cm size, containing a single thin
septation; follow-up ultrasound recommended in 6 months to assess
stability and character.

## 2021-02-09 NOTE — Therapy (Signed)
[facility] [address], [place], [address] Phone: [phone]   Fax:  [phone]  Physical Therapy Treatment  Patient Details  Name: Michaela Hardy MRN: [id number] Date of Birth: 08/30/1955 Referring Provider (PT): Jon Billings, NP   Encounter Date: 02/09/2021   PT End of Session - 02/09/21 1714     Visit Number 3    Number of Visits 10    Date for PT Re-Evaluation 03/17/21    Authorization Type BC[employer]    Authorization Time Period 01/13/21-03/17/21    Progress Note Due on Visit 10    PT Start Time 1633    PT Stop Time 1657    PT Time Calculation (min) 24 min    Activity Tolerance Patient tolerated treatment well;No increased pain    Behavior During Therapy WFL for tasks assessed/performed             Past Medical History:  Diagnosis Date   Anxiety    Depression    GERD (gastroesophageal reflux disease)    Hepatitis    Primary bilary cholangitis   Hyperlipidemia    Hypertension    PONV (postoperative nausea and vomiting)     Past Surgical History:  Procedure Laterality Date   APPENDECTOMY  1976   BREAST BIOPSY Left 10/03/2014   Dr. Autumn Patty   BREAST EXCISIONAL BIOPSY Left    BREAST LUMPECTOMY WITH RADIOACTIVE SEED LOCALIZATION Left 11/21/2014   Procedure: LEFT BREAST LUMPECTOMY WITH RADIOACTIVE SEED LOCALIZATION;  Surgeon: Autumn Messing III, MD;  Location: [facility];  Service: General;  Laterality: Left;   CERVICAL BIOPSY  W/ LOOP ELECTRODE EXCISION     CHOLECYSTECTOMY  06/29/2007   LIVER BIOPSY  2008   TUBAL LIGATION  1987    There were no vitals filed for this visit.   Subjective Assessment - 02/09/21 1636     Subjective Pt doing well today, reports her HEP is helping a lot and that she can tell a difference.    Pertinent History Per Jon Billings NP Patient seen today following her visit on 12/10/2020 for leg pain.  Patient was given exercises.  And treated with  ibuprofen and tizanidine.  Patient states the right one no longer hurts but her left one is still painful.  She states the exercises she was given during the last visit didn't really help her symptoms. Patient states that if she walks a lot she is limping due to the pain.  Patient states the pain is on the outside of her leg where her his is. Patient states the pain is more of an ache and not painful to touch.    How long can you sit comfortably? No limits here    How long can you stand comfortably? No limits here    How long can you walk comfortably? Is able to grocery shop without exacerbation now.    Patient Stated Goals Wants the left hip pain to go away and to function normally    Currently in Pain? No/denies                [facility] PT Assessment - 02/09/21 0001       Observation/Other Assessments   Focus on Therapeutic Outcomes (FOTO)  67             -Review of HEP items and potential need for exercise advancement in future.  -Discussion of goals of treatment     PT Education - 02/09/21  1714     Education Details Ecnouraged to come back as needed for home program updates within cert period.    Person(s) Educated Patient    Methods Explanation    Comprehension Verbalized understanding                 PT Long Term Goals - 02/09/21 1719       PT LONG TERM GOAL #1   Title Patient will have improved function and activity level as evidenced by an increase in FOTO score by 10 points or more.    Baseline 9/6: 72/81 02/09/21: 67    Time 9    Period Weeks    Status Achieved    Target Date 03/17/21      PT LONG TERM GOAL #2   Title Patient will be able to complete grocery shopping without experiencing hip pain that is >=4 out of 10.    Baseline 9/6: NPS 7/10; 10/3: no pain    Time 9    Period Weeks    Status Achieved      PT LONG TERM GOAL #3   Title Patient will be able to clean her house without experiencing an increase in her hip pain that is >=4 out of 10.     Baseline 9/6: 7/10; 10/3: no pain increase    Time 9    Status Achieved    Target Date 03/17/21                   Plan - 02/09/21 1715     Clinical Impression Statement Pt arrives reporting improved symptoms overall, return to activity tolerance, and good adherance to HEP. Pt reports she feels ready for DC at this time, plan to continue with HEP going forward. FOTO survery performed and goals of care reviewed showing completion of 2 of 3 goals. Pt has elected to cancel remaining visits. Chief Strategy Officer educated patient about her certification period and encouraged to contact clinic with any needs, that she can schedule as needed throughout 11/8, then after that will need a new referral. Pt agreeable.    Personal Factors and Comorbidities Comorbidity 3+;Past/Current Experience    Comorbidities GERD, HTN, HLD, Anxiety, Depression    Examination-Activity Limitations [place];Locomotion Level    Examination-Participation Restrictions Cleaning;Other;Shop    Stability/Clinical Decision Making Stable/Uncomplicated    Rehab Potential Good    PT Frequency 1x / week    PT Duration Other (comment)   9 weeks   PT Treatment/Interventions Gait training;Moist Heat;Joint Manipulations;Manual techniques;Therapeutic activities;Therapeutic exercise;Neuromuscular re-education;Balance training;Cryotherapy;Dry needling    PT Next Visit Plan Pt FU as needed; no scheduled visits at this time. Will plan on DC after 11/8.    PT Home Exercise Plan [id number]    Consulted and Agree with Plan of Care Patient             Patient will benefit from skilled therapeutic intervention in order to improve the following deficits and impairments:  Decreased strength, Pain, Impaired flexibility, Difficulty walking  Visit Diagnosis: Pain in left hip  Difficulty in walking, not elsewhere classified     Problem List Patient Active Problem List   Diagnosis Date Noted   Prediabetes 11/26/2020   Disease of liver  05/25/2019   Biliary cirrhosis ([place]) 05/25/2019   Elevated blood sugar 10/20/2015   Allergic sinusitis 01/14/2015   Eustachian tube dysfunction 01/14/2015   Primary biliary cholangitis ([place]) 11/15/2014   Other migraine, not intractable, without status migrainosus 08/10/2014   Depressive disorder 08/10/2014  Acid reflux 08/10/2014   Other specified bacterial intestinal infections 08/10/2014   Hypercholesteremia 08/10/2014   Hypertensive disorder 08/10/2014   Insomnia 08/10/2014   Gastro-esophageal reflux disease without esophagitis 08/10/2014   Major depressive disorder, single episode 08/10/2014   5:22 PM, 02/09/21 Etta Grandchild, PT, DPT Physical Therapist - [place] [phone] (Office)   Helen C, PT 02/09/2021, 5:21 PM  [facility] [address] [place], [address] Phone: [phone]   Fax:  [phone]  Name: LENNYN GANGE MRN: [id number] Date of Birth: [birth year]

## 2021-02-11 ENCOUNTER — Ambulatory Visit
Admission: RE | Admit: 2021-02-11 | Discharge: 2021-02-11 | Disposition: A | Discharge status: Home / Self Care | Payer: BLUE CROSS/BLUE SHIELD | Source: Ambulatory Visit | Attending: Obstetrics and Gynecology | Admitting: Obstetrics and Gynecology

## 2021-02-11 ENCOUNTER — Other Ambulatory Visit: Payer: Self-pay | Admitting: Obstetrics and Gynecology

## 2021-02-11 ENCOUNTER — Other Ambulatory Visit: Payer: Self-pay

## 2021-02-11 DIAGNOSIS — R922 Inconclusive mammogram: Secondary | ICD-10-CM | POA: Diagnosis not present

## 2021-02-11 DIAGNOSIS — R928 Other abnormal and inconclusive findings on diagnostic imaging of breast: Secondary | ICD-10-CM

## 2021-02-12 ENCOUNTER — Encounter: Payer: BLUE CROSS/BLUE SHIELD | Admitting: Physical Therapy

## 2021-02-16 ENCOUNTER — Ambulatory Visit: Payer: BLUE CROSS/BLUE SHIELD | Admitting: Physical Therapy

## 2021-02-19 ENCOUNTER — Encounter: Payer: BLUE CROSS/BLUE SHIELD | Admitting: Physical Therapy

## 2021-02-23 ENCOUNTER — Encounter: Payer: BLUE CROSS/BLUE SHIELD | Admitting: Physical Therapy

## 2021-02-26 ENCOUNTER — Encounter: Payer: BLUE CROSS/BLUE SHIELD | Admitting: Physical Therapy

## 2021-03-02 ENCOUNTER — Encounter: Payer: BLUE CROSS/BLUE SHIELD | Admitting: Physical Therapy

## 2021-03-05 ENCOUNTER — Encounter: Payer: BLUE CROSS/BLUE SHIELD | Admitting: Physical Therapy

## 2021-03-10 ENCOUNTER — Other Ambulatory Visit: Payer: Self-pay | Admitting: Gastroenterology

## 2021-03-10 DIAGNOSIS — K743 Primary biliary cirrhosis: Secondary | ICD-10-CM

## 2021-03-10 DIAGNOSIS — K219 Gastro-esophageal reflux disease without esophagitis: Secondary | ICD-10-CM | POA: Diagnosis not present

## 2021-03-18 ENCOUNTER — Other Ambulatory Visit: Payer: Self-pay | Admitting: Nurse Practitioner

## 2021-03-18 DIAGNOSIS — I10 Essential (primary) hypertension: Secondary | ICD-10-CM

## 2021-03-18 MED ORDER — TELMISARTAN-HCTZ 80-25 MG PO TABS: 1.0000 | ORAL_TABLET | Freq: Every day | ORAL | 0 refills | Status: DC

## 2021-03-18 NOTE — Telephone Encounter (Signed)
Medication Refill - Medication: telmisartan-hydrochlorothiazide (MICARDIS HCT) 80-25 MG tablet   Has the patient contacted their pharmacy? Yes.   (Agent: If no, request that the patient contact the pharmacy for the refill. If patient does not wish to contact the pharmacy document the reason why and proceed with request.) (Agent: If yes, when and what did the pharmacy advise?)  Preferred Pharmacy (with phone number or street name):  [facility] - [place], [place] - [address]  [address] [place] [address]  Phone: [phone] Fax: [phone]   Has the patient been seen for an appointment in the last year OR does the patient have an upcoming appointment? Yes.    Agent: Please be advised that RX refills may take up to 3 business days. We ask that you follow-up with your pharmacy.

## 2021-03-18 NOTE — Telephone Encounter (Signed)
Requested Prescriptions  Pending Prescriptions Disp Refills  . telmisartan-hydrochlorothiazide (MICARDIS HCT) 80-25 MG tablet 90 tablet 0    Sig: Take 1 tablet by mouth daily.     Cardiovascular: ARB + Diuretic Combos Failed - 03/18/2021  3:31 PM      Failed - Last BP in normal range    BP Readings from Last 1 Encounters:  01/13/21 (!) 144/76         Passed - K in normal range and within 180 days    Potassium  Date Value Ref Range Status  11/26/2020 3.7 3.5 - 5.2 mmol/L Final         Passed - Na in normal range and within 180 days    Sodium  Date Value Ref Range Status  11/26/2020 141 134 - 144 mmol/L Final         Passed - Cr in normal range and within 180 days    Creatinine, Ser  Date Value Ref Range Status  11/26/2020 0.67 0.57 - 1.00 mg/dL Final         Passed - Ca in normal range and within 180 days    Calcium  Date Value Ref Range Status  11/26/2020 9.7 8.7 - 10.3 mg/dL Final         Passed - Patient is not pregnant      Passed - Valid encounter within last 6 months    Recent Outpatient Visits          2 months ago Muscle strain   [facility] Jon Billings, NP   3 months ago Muscle strain of left thigh, initial encounter   [place], Lauren A, NP   3 months ago Annual physical exam   [place], NP   5 months ago Muscle spasm   [facility] Jon Billings, NP   8 months ago Chronic pain of left knee   [facility] [place], [place], [place]

## 2021-03-25 ENCOUNTER — Ambulatory Visit
Admission: RE | Admit: 2021-03-25 | Discharge: 2021-03-25 | Disposition: A | Discharge status: Home / Self Care | Payer: BLUE CROSS/BLUE SHIELD | Source: Ambulatory Visit | Attending: Gastroenterology | Admitting: Gastroenterology

## 2021-03-25 ENCOUNTER — Other Ambulatory Visit: Payer: Self-pay

## 2021-03-25 DIAGNOSIS — K743 Primary biliary cirrhosis: Secondary | ICD-10-CM | POA: Diagnosis not present

## 2021-03-25 DIAGNOSIS — K760 Fatty (change of) liver, not elsewhere classified: Secondary | ICD-10-CM | POA: Diagnosis not present

## 2021-04-20 ENCOUNTER — Other Ambulatory Visit: Payer: Self-pay | Admitting: Nurse Practitioner

## 2021-04-20 DIAGNOSIS — M2550 Pain in unspecified joint: Secondary | ICD-10-CM

## 2021-04-20 NOTE — Telephone Encounter (Signed)
Medication Refill - Medication:   meloxicam (MOBIC) 15 MG tablet  Has the patient contacted their pharmacy? Yes.   Mail order pharmacy advised her to contact the office, this Rx was last prescribed by Fenton Malling.   Preferred Pharmacy (with phone number or street name):   [facility] ([facility] - [place], [place] - [address]  [address] [place], [place] [address]  Phone:  [phone]  Fax:  [phone]   Has the patient been seen for an appointment in the last year OR does the patient have an upcoming appointment? Yes.    Agent: Please be advised that RX refills may take up to 3 business days. We ask that you follow-up with your pharmacy.

## 2021-04-20 NOTE — Telephone Encounter (Signed)
Requested medication (s) are due for refill today - unsure  Requested medication (s) are on the active medication list -yes  Future visit scheduled -no  Last refill: 06/11/20 #90 1RF  Notes to clinic: Request RF: Rx prescribed by outside provider  Requested Prescriptions  Pending Prescriptions Disp Refills   meloxicam (MOBIC) 15 MG tablet 90 tablet 1    Sig: Take 1 tablet (15 mg total) by mouth daily.     Analgesics:  COX2 Inhibitors Passed - 04/20/2021 12:28 PM      Passed - HGB in normal range and within 360 days    Hemoglobin  Date Value Ref Range Status  11/26/2020 12.9 11.1 - 15.9 g/dL Final          Passed - Cr in normal range and within 360 days    Creatinine, Ser  Date Value Ref Range Status  11/26/2020 0.67 0.57 - 1.00 mg/dL Final          Passed - Patient is not pregnant      Passed - Valid encounter within last 12 months    Recent Outpatient Visits           3 months ago Muscle strain   [place], Santiago Glad, NP   4 months ago Muscle strain of left thigh, initial encounter   [place], Scheryl Darter, NP   4 months ago Annual physical exam   [facility] Jon Billings, NP   6 months ago Muscle spasm   [facility] Jon Billings, NP   9 months ago Chronic pain of left knee   [facility] [place], Anderson [place], [place]                 Requested Prescriptions  Pending Prescriptions Disp Refills   meloxicam (MOBIC) 15 MG tablet 90 tablet 1    Sig: Take 1 tablet (15 mg total) by mouth daily.     Analgesics:  COX2 Inhibitors Passed - 04/20/2021 12:28 PM      Passed - HGB in normal range and within 360 days    Hemoglobin  Date Value Ref Range Status  11/26/2020 12.9 11.1 - 15.9 g/dL Final          Passed - Cr in normal range and within 360 days    Creatinine, Ser  Date Value Ref Range Status  11/26/2020 0.67 0.57 - 1.00 mg/dL Final          Passed - Patient  is not pregnant      Passed - Valid encounter within last 12 months    Recent Outpatient Visits           3 months ago Muscle strain   [facility] Jon Billings, NP   4 months ago Muscle strain of left thigh, initial encounter   [place], Lauren A, NP   4 months ago Annual physical exam   [place], NP   6 months ago Muscle spasm   [place], NP   9 months ago Chronic pain of left knee   [facility] [place], [place], [place]

## 2021-04-21 ENCOUNTER — Other Ambulatory Visit: Payer: Self-pay

## 2021-04-21 DIAGNOSIS — M2550 Pain in unspecified joint: Secondary | ICD-10-CM

## 2021-04-21 MED ORDER — MELOXICAM 15 MG PO TABS: 15.0000 mg | ORAL_TABLET | Freq: Every day | ORAL | 0 refills | Status: DC | PRN

## 2021-04-21 NOTE — Addendum Note (Signed)
Addended by: Elliot Cousin on: 04/21/2021 03:38 PM   Modules accepted: Orders

## 2021-04-21 NOTE — Addendum Note (Signed)
Addended by: Jon Billings on: 04/21/2021 09:34 AM   Modules accepted: Orders

## 2021-04-21 NOTE — Telephone Encounter (Signed)
Pt called and wants to have this sent via [facility], please resend.

## 2021-04-21 NOTE — Telephone Encounter (Signed)
Patient is requesting refill of Meloxicam 15 mg. 90Qty Take daily prn Patient was last seen on 01/01/21, no up coming appt.

## 2021-04-21 NOTE — Telephone Encounter (Signed)
Patient request mail order pharmacy - Rx resent

## 2021-04-21 NOTE — Telephone Encounter (Signed)
Medication sent to the pharmacy.

## 2021-06-15 ENCOUNTER — Other Ambulatory Visit: Payer: Self-pay | Admitting: Nurse Practitioner

## 2021-06-15 ENCOUNTER — Telehealth: Payer: Self-pay | Admitting: Nurse Practitioner

## 2021-06-15 DIAGNOSIS — I10 Essential (primary) hypertension: Secondary | ICD-10-CM

## 2021-06-15 DIAGNOSIS — M2550 Pain in unspecified joint: Secondary | ICD-10-CM

## 2021-06-15 NOTE — Telephone Encounter (Signed)
Duplicate encounter

## 2021-06-15 NOTE — Telephone Encounter (Signed)
Medication Refill - Medication:  telmisartan-hydrochlorothiazide (MICARDIS HCT) 80-25 MG tablet  meloxicam (MOBIC) 15 MG tablet  rosuvastatin (CRESTOR) 5 MG tablet   Has the patient contacted their pharmacy? Yes.   Contact PCP  Preferred Pharmacy (with phone number or street name):   Has the patient been seen for an appointment in the last year OR does the patient have an upcoming appointment? Yes.    Agent: Please be advised that RX refills may take up to 3 business days. We ask that you follow-up with your pharmacy.

## 2021-06-15 NOTE — Addendum Note (Signed)
Addended by: Matilde Sprang on: 06/15/2021 05:18 PM   Modules accepted: Orders

## 2021-06-15 NOTE — Telephone Encounter (Signed)
Copied from [place] [phone]. Topic: Quick Communication - Rx Refill/Question >> Jun 15, 2021  4:55 PM Tessa Lerner A wrote: Medication: meloxicam (MOBIC) 15 MG tablet [[id number]]  telmisartan-hydrochlorothiazide (MICARDIS HCT) 80-25 MG tablet [[id number]]   rosuvastatin (CRESTOR) 5 MG tablet [[id number]]   Has the patient contacted their pharmacy? Yes.   (Agent: If no, request that the patient contact the pharmacy for the refill. If patient does not wish to contact the pharmacy document the reason why and proceed with request.) (Agent: If yes, when and what did the pharmacy advise?)  Preferred Pharmacy (with phone number or street name): [facility] [place] [address] Phone: [phone] Fax: [phone] Hours: Not open 24 hours   Has the patient been seen for an appointment in the last year OR does the patient have an upcoming appointment? Yes.    Agent: Please be advised that RX refills may take up to 3 business days. We ask that you follow-up with your pharmacy.

## 2021-06-15 NOTE — Telephone Encounter (Signed)
Patient called, left VM to return the call to the office to let us know which pharmacy to send the medications to.

## 2021-06-15 NOTE — Telephone Encounter (Signed)
Patient returned call in another encounter and told the agent she wanted to use [facility].

## 2021-06-16 NOTE — Telephone Encounter (Signed)
Requested medication (s) are due for refill today: Yes  Requested medication (s) are on the active medication list: Yes  Last refill:    Future visit scheduled: No  Notes to clinic:  Protocols indicate pt. Needs lab work.    Requested Prescriptions  Pending Prescriptions Disp Refills   telmisartan-hydrochlorothiazide (MICARDIS HCT) 80-25 MG tablet 90 tablet 0    Sig: Take 1 tablet by mouth daily.     Cardiovascular: ARB + Diuretic Combos Failed - 06/15/2021  5:18 PM      Failed - K in normal range and within 180 days    Potassium  Date Value Ref Range Status  11/26/2020 3.7 3.5 - 5.2 mmol/L Final          Failed - Na in normal range and within 180 days    Sodium  Date Value Ref Range Status  11/26/2020 141 134 - 144 mmol/L Final          Failed - Cr in normal range and within 180 days    Creatinine, Ser  Date Value Ref Range Status  11/26/2020 0.67 0.57 - 1.00 mg/dL Final          Failed - eGFR is 10 or above and within 180 days    GFR calc Af Amer  Date Value Ref Range Status  08/30/2019 108 >59 mL/min/1.73 Final   GFR calc non Af Amer  Date Value Ref Range Status  08/30/2019 94 >59 mL/min/1.73 Final   eGFR  Date Value Ref Range Status  11/26/2020 98 >59 mL/min/1.73 Final          Failed - Last BP in normal range    BP Readings from Last 1 Encounters:  01/13/21 (!) 144/76          Passed - Patient is not pregnant      Passed - Valid encounter within last 6 months    Recent Outpatient Visits           5 months ago Muscle strain   [facility] Jon Billings, NP   6 months ago Muscle strain of left thigh, initial encounter   [place], Scheryl Darter, NP   6 months ago Annual physical exam   [facility] Jon Billings, NP   8 months ago Muscle spasm   [facility] Jon Billings, NP   11 months ago Chronic pain of left knee   [facility] Fenton Malling M, PA-C                rosuvastatin (CRESTOR) 5 MG tablet 90 tablet 3    Sig: Take 1 tablet (5 mg total) by mouth daily.     Cardiovascular:  Antilipid - Statins 2 Failed - 06/15/2021  5:18 PM      Failed - Lipid Panel in normal range within the last 12 months    Cholesterol, Total  Date Value Ref Range Status  11/26/2020 208 (H) 100 - 199 mg/dL Final   LDL Chol Calc (NIH)  Date Value Ref Range Status  11/26/2020 123 (H) 0 - 99 mg/dL Final   HDL  Date Value Ref Range Status  11/26/2020 63 >39 mg/dL Final   Triglycerides  Date Value Ref Range Status  11/26/2020 126 0 - 149 mg/dL Final         Passed - Cr in normal range and within 360 days    Creatinine, Ser  Date Value Ref Range Status  11/26/2020 0.67 0.57 -  1.00 mg/dL Final          Passed - Patient is not pregnant      Passed - Valid encounter within last 12 months    Recent Outpatient Visits           5 months ago Muscle strain   [facility] Jon Billings, NP   6 months ago Muscle strain of left thigh, initial encounter   [place], Scheryl Darter, NP   6 months ago Annual physical exam   [facility] Jon Billings, NP   8 months ago Muscle spasm   [facility] Jon Billings, NP   11 months ago Chronic pain of left knee   [facility] Fenton Malling M, [place]               meloxicam (MOBIC) 15 MG tablet 90 tablet 0    Sig: Take 1 tablet (15 mg total) by mouth daily as needed for pain.     Analgesics:  COX2 Inhibitors Failed - 06/15/2021  5:18 PM      Failed - Manual Review: Labs are only required if the patient has taken medication for more than 8 weeks.      Failed - ALT in normal range and within 360 days    ALT  Date Value Ref Range Status  11/26/2020 42 (H) 0 - 32 IU/L Final          Passed - HGB in normal range and within 360 days    Hemoglobin  Date Value Ref Range Status  11/26/2020 12.9 11.1 - 15.9 g/dL Final           Passed - Cr in normal range and within 360 days    Creatinine, Ser  Date Value Ref Range Status  11/26/2020 0.67 0.57 - 1.00 mg/dL Final          Passed - HCT in normal range and within 360 days    Hematocrit  Date Value Ref Range Status  11/26/2020 38.3 34.0 - 46.6 % Final          Passed - AST in normal range and within 360 days    AST  Date Value Ref Range Status  11/26/2020 28 0 - 40 IU/L Final          Passed - eGFR is 30 or above and within 360 days    GFR calc Af Amer  Date Value Ref Range Status  08/30/2019 108 >59 mL/min/1.73 Final   GFR calc non Af Amer  Date Value Ref Range Status  08/30/2019 94 >59 mL/min/1.73 Final   eGFR  Date Value Ref Range Status  11/26/2020 98 >59 mL/min/1.73 Final          Passed - Patient is not pregnant      Passed - Valid encounter within last 12 months    Recent Outpatient Visits           5 months ago Muscle strain   [facility] Jon Billings, NP   6 months ago Muscle strain of left thigh, initial encounter   [place], Scheryl Darter, NP   6 months ago Annual physical exam   [place], NP   8 months ago Muscle spasm   [facility] Jon Billings, NP   11 months ago Chronic pain of left knee   [facility] [place], [place], [place]

## 2021-06-16 NOTE — Addendum Note (Signed)
Addended by: Jon Billings on: 06/16/2021 11:21 AM   Modules accepted: Orders

## 2021-06-17 MED ORDER — TELMISARTAN-HCTZ 80-25 MG PO TABS: 1.0000 | ORAL_TABLET | Freq: Every day | ORAL | 0 refills | Status: DC

## 2021-06-17 NOTE — Telephone Encounter (Signed)
Please find out if medication can be sent electronically to the pharmacy.

## 2021-06-17 NOTE — Telephone Encounter (Signed)
Medication sent to the pharmacy.

## 2021-06-17 NOTE — Addendum Note (Signed)
Addended by: Jon Billings on: 06/17/2021 03:59 PM   Modules accepted: Orders

## 2021-06-26 ENCOUNTER — Encounter: Payer: Self-pay | Admitting: Nurse Practitioner

## 2021-06-26 ENCOUNTER — Other Ambulatory Visit: Payer: Self-pay

## 2021-06-26 ENCOUNTER — Ambulatory Visit (INDEPENDENT_AMBULATORY_CARE_PROVIDER_SITE_OTHER): Payer: BLUE CROSS/BLUE SHIELD | Admitting: Nurse Practitioner

## 2021-06-26 VITALS — BP 139/72 | HR 76 | Temp 98.0°F | Ht 66.6 in | Wt 216.0 lb

## 2021-06-26 DIAGNOSIS — Z0000 Encounter for general adult medical examination without abnormal findings: Secondary | ICD-10-CM | POA: Diagnosis not present

## 2021-06-26 DIAGNOSIS — R7303 Prediabetes: Secondary | ICD-10-CM | POA: Diagnosis not present

## 2021-06-26 DIAGNOSIS — I10 Essential (primary) hypertension: Secondary | ICD-10-CM | POA: Diagnosis not present

## 2021-06-26 DIAGNOSIS — F329 Major depressive disorder, single episode, unspecified: Secondary | ICD-10-CM

## 2021-06-26 DIAGNOSIS — E7800 Pure hypercholesterolemia, unspecified: Secondary | ICD-10-CM

## 2021-06-26 DIAGNOSIS — Z23 Encounter for immunization: Secondary | ICD-10-CM | POA: Diagnosis not present

## 2021-06-26 DIAGNOSIS — M2550 Pain in unspecified joint: Secondary | ICD-10-CM

## 2021-06-26 LAB — URINALYSIS, ROUTINE W REFLEX MICROSCOPIC
Bilirubin, UA: NEGATIVE
Glucose, UA: NEGATIVE
Ketones, UA: NEGATIVE
Leukocytes,UA: NEGATIVE
Nitrite, UA: NEGATIVE
Protein,UA: NEGATIVE
RBC, UA: NEGATIVE
Specific Gravity, UA: 1.02 (ref 1.005–1.030)
Urobilinogen, Ur: 0.2 mg/dL (ref 0.2–1.0)
pH, UA: 6 (ref 5.0–7.5)

## 2021-06-26 MED ORDER — MELOXICAM 15 MG PO TABS: 15.0000 mg | ORAL_TABLET | Freq: Every day | ORAL | 1 refills | Status: DC | PRN

## 2021-06-26 MED ORDER — TELMISARTAN-HCTZ 80-25 MG PO TABS: 1.0000 | ORAL_TABLET | Freq: Every day | ORAL | 1 refills | Status: DC

## 2021-06-26 MED ORDER — ROSUVASTATIN CALCIUM 5 MG PO TABS: 5.0000 mg | ORAL_TABLET | Freq: Every day | ORAL | 1 refills | Status: DC

## 2021-06-26 NOTE — Assessment & Plan Note (Signed)
Labs ordered today. Will make recommendations based on lab results. Follow up in 6 months.  Call sooner if concerns arise. 

## 2021-06-26 NOTE — Progress Notes (Signed)
BP 139/72    Pulse 76    Temp 98 F (36.7 C) (Oral)    Ht 5' 6.6" (1.692 m)    Wt 216 lb (98 kg)    SpO2 98%    BMI 34.24 kg/m    Subjective:    Patient ID: Michaela Hardy, female    DOB: 1955-11-08, 66 y.o.   MRN: [id number]  HPI: Michaela Hardy is a 66 y.o. female presenting on 06/26/2021 for comprehensive medical examination. Current medical complaints include:none  She currently lives with: Menopausal Symptoms: no  HYPERTENSION / HYPERLIPIDEMIA Satisfied with current treatment? yes Duration of hypertension: years BP monitoring frequency: not checking BP range:  BP medication side effects: no Past BP meds:  Telemesartan and HCTZ Duration of hyperlipidemia: years Cholesterol medication side effects: no Cholesterol supplements: none Past cholesterol medications: rosuvastatin (crestor) Medication compliance: excellent compliance Aspirin: no Recent stressors: no Recurrent headaches: no Visual changes: no Palpitations: no Dyspnea: no Chest pain: no Lower extremity edema: no Dizzy/lightheaded: no   Depression Screen done today and results listed below:  Depression screen [facility] 2/9 06/26/2021 11/26/2020 11/26/2020 07/02/2020 12/06/2017  Decreased Interest 0 0 0 0 0  Down, Depressed, Hopeless 0 0 0 0 0  PHQ - 2 Score 0 0 0 0 0  Altered sleeping 0 0 - 0 -  Tired, decreased energy 0 0 - 0 -  Change in appetite 0 0 - 0 -  Feeling bad or failure about yourself  0 0 - 0 -  Trouble concentrating 0 0 - 0 -  Moving slowly or fidgety/restless 0 0 - 0 -  Suicidal thoughts 0 0 - 0 -  PHQ-9 Score 0 0 - 0 -  Difficult doing work/chores Not difficult at all Not difficult at all - Not difficult at all -    The patient does not have a history of falls. I did complete a risk assessment for falls. A plan of care for falls was documented.   Past Medical History:  Past Medical History:  Diagnosis Date   Anxiety    Depression    GERD (gastroesophageal reflux disease)    Hepatitis     Primary bilary cholangitis   Hyperlipidemia    Hypertension    PONV (postoperative nausea and vomiting)     Surgical History:  Past Surgical History:  Procedure Laterality Date   APPENDECTOMY  1976   BREAST BIOPSY Left 10/03/2014   Dr. Autumn Patty   BREAST EXCISIONAL BIOPSY Left    BREAST LUMPECTOMY WITH RADIOACTIVE SEED LOCALIZATION Left 11/21/2014   Procedure: LEFT BREAST LUMPECTOMY WITH RADIOACTIVE SEED LOCALIZATION;  Surgeon: Autumn Messing III, MD;  Location: [place] [facility];  Service: General;  Laterality: Left;   CERVICAL BIOPSY  W/ LOOP ELECTRODE EXCISION     CHOLECYSTECTOMY  06/29/2007   LIVER BIOPSY  2008   TUBAL LIGATION  1987    Medications:  Current Outpatient Medications on File Prior to Visit  Medication Sig   calcium carbonate (OS-CAL) 600 MG TABS tablet Take 1 tablet by mouth daily with breakfast.    ketoconazole (NIZORAL) 2 % cream Apply 1 application topically daily.   Multiple Vitamin tablet Take 1 tablet by mouth daily.   omeprazole (PRILOSEC) 20 MG capsule Take 1 capsule by mouth daily.   ursodiol (ACTIGALL) 300 MG capsule Take 2 capsules by mouth 2 (two) times daily.   No current facility-administered medications on file prior to visit.    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  Tobacco Use   Smoking status: Former    Packs/day: 1.50    Years: 40.00    Pack years: 60.00    Types: Cigarettes    Quit date: 05/10/2004    Years since quitting: 17.1   Smokeless tobacco: Never  Vaping Use   Vaping Use: Never used  Substance and  Sexual Activity   Alcohol use: Yes    Alcohol/week: 2.0 - 4.0 standard drinks    Types: 2 - 4 Glasses of wine per week   Drug use: No   Sexual activity: Not Currently  Other Topics Concern   Not on file  Social History Narrative   Not on file   Social Determinants of Health   Financial Resource Strain: Not on file  Food Insecurity: Not on file  Transportation Needs: Not on file  Physical Activity: Not on file  Stress: Not on file  Social Connections: Not on file  Intimate Partner Violence: Not on file   Social History   Tobacco Use  Smoking Status Former   Packs/day: 1.50   Years: 40.00   Pack years: 60.00   Types: Cigarettes   Quit date: 05/10/2004   Years since quitting: 17.1  Smokeless Tobacco Never   Social History   Substance and Sexual Activity  Alcohol Use Yes   Alcohol/week: 2.0 - 4.0 standard drinks   Types: 2 - 4 Glasses of wine per week    Family History:  Family History  Problem Relation Age of Onset   Hypertension Mother    Pancreatitis Mother    Thyroid disease Mother    Hypertension Father    Heart attack Father    Cerebrovascular Accident Father    Coronary artery disease Father    Glaucoma Father    Cancer Father        lung   Parkinson's disease Maternal Grandmother    Breast cancer Neg Hx    Colon cancer Neg Hx    Ovarian cancer Neg Hx     Past medical history, surgical history, medications, allergies, family history and social history reviewed with patient today and changes made to appropriate areas of the chart.   Review of Systems  Eyes:  Negative for blurred vision and double vision.  Respiratory:  Negative for shortness of breath.   Cardiovascular:  Negative for chest pain, palpitations and leg swelling.  Neurological:  Negative for dizziness and headaches.  All other ROS negative except what is listed above and in the HPI.      Objective:    BP 139/72    Pulse 76    Temp 98 F (36.7 C) (Oral)    Ht 5' 6.6" (1.692 m)    Wt  216 lb (98 kg)    SpO2 98%    BMI 34.24 kg/m   Wt Readings from Last 3 Encounters:  06/26/21 216 lb (98 kg)  12/10/20 215 lb 6.4 oz (97.7 kg)  11/26/20 215 lb 8 oz (97.8 kg)    Physical  Exam Vitals and nursing note reviewed.  Constitutional:      General: She is awake. She is not in acute distress.    Appearance: She is well-developed. She is not ill-appearing.  HENT:     Head: Normocephalic and atraumatic.     Right Ear: Hearing, tympanic membrane, ear canal and external ear normal. No drainage.     Left Ear: Hearing, tympanic membrane, ear canal and external ear normal. No drainage.     Nose: Nose normal.     Right Sinus: No maxillary sinus tenderness or frontal sinus tenderness.     Left Sinus: No maxillary sinus tenderness or frontal sinus tenderness.     Mouth/Throat:     Mouth: Mucous membranes are moist.     Pharynx: Oropharynx is clear. Uvula midline. No pharyngeal swelling, oropharyngeal exudate or posterior oropharyngeal erythema.  Eyes:     General: Lids are normal.        Right eye: No discharge.        Left eye: No discharge.     Extraocular Movements: Extraocular movements intact.     Conjunctiva/sclera: Conjunctivae normal.     Pupils: Pupils are equal, round, and reactive to light.     Visual Fields: Right eye visual fields normal and left eye visual fields normal.  Neck:     Thyroid: No thyromegaly.     Vascular: No carotid bruit.     Trachea: Trachea normal.  Cardiovascular:     Rate and Rhythm: Normal rate and regular rhythm.     Heart sounds: Normal heart sounds. No murmur heard.   No gallop.  Pulmonary:     Effort: Pulmonary effort is normal. No accessory muscle usage or respiratory distress.     Breath sounds: Normal breath sounds.  Chest:  Breasts:    Right: Normal.     Left: Normal.  Abdominal:     General: Bowel sounds are normal.     Palpations: Abdomen is soft. There is no hepatomegaly or splenomegaly.     Tenderness: There is no abdominal  tenderness.  Musculoskeletal:        General: Normal range of motion.     Cervical back: Normal range of motion and neck supple.     Right lower leg: No edema.     Left lower leg: No edema.  Lymphadenopathy:     Head:     Right side of head: No submental, submandibular, tonsillar, preauricular or posterior auricular adenopathy.     Left side of head: No submental, submandibular, tonsillar, preauricular or posterior auricular adenopathy.     Cervical: No cervical adenopathy.     Upper Body:     Right upper body: No supraclavicular, axillary or pectoral adenopathy.     Left upper body: No supraclavicular, axillary or pectoral adenopathy.  Skin:    General: Skin is warm and dry.     Capillary Refill: Capillary refill takes less than 2 seconds.     Findings: No rash.  Neurological:     Mental Status: She is alert and oriented to person, place, and time.     Gait: Gait is intact.     Deep Tendon Reflexes: Reflexes are normal and symmetric.     Reflex Scores:      Brachioradialis reflexes are 2+ on the right side and 2+ on the left side.      Patellar reflexes are 2+ on the right side and 2+ on the left side. Psychiatric:  Attention and Perception: Attention normal.        Mood and Affect: Mood normal.        Speech: Speech normal.        Behavior: Behavior normal. Behavior is cooperative.        Thought Content: Thought content normal.        Judgment: Judgment normal.    Results for orders placed or performed in visit on 11/26/20  Microscopic Examination   Urine  Result Value Ref Range   WBC, UA 0-5 0 - 5 /hpf   RBC None seen 0 - 2 /hpf   Epithelial Cells (non renal) None seen 0 - 10 /hpf   Bacteria, UA Few (A) None seen/Few  CBC with Differential/Platelet  Result Value Ref Range   WBC 7.3 3.4 - 10.8 x10E3/uL   RBC 4.29 3.77 - 5.28 x10E6/uL   Hemoglobin 12.9 11.1 - 15.9 g/dL   Hematocrit 38.3 34.0 - 46.6 %   MCV 89 79 - 97 fL   MCH 30.1 26.6 - 33.0 pg   MCHC 33.7  31.5 - 35.7 g/dL   RDW 12.7 11.7 - 15.4 %   Platelets 385 150 - 450 x10E3/uL   Neutrophils 67 Not Estab. %   Lymphs 21 Not Estab. %   Monocytes 10 Not Estab. %   Eos 1 Not Estab. %   Basos 1 Not Estab. %   Neutrophils Absolute 4.9 1.4 - 7.0 x10E3/uL   Lymphocytes Absolute 1.6 0.7 - 3.1 x10E3/uL   Monocytes Absolute 0.7 0.1 - 0.9 x10E3/uL   EOS (ABSOLUTE) 0.1 0.0 - 0.4 x10E3/uL   Basophils Absolute 0.1 0.0 - 0.2 x10E3/uL   Immature Granulocytes 0 Not Estab. %   Immature Grans (Abs) 0.0 0.0 - 0.1 x10E3/uL  Comprehensive metabolic panel  Result Value Ref Range   Glucose 152 (H) 65 - 99 mg/dL   BUN 20 8 - 27 mg/dL   Creatinine, Ser 0.67 0.57 - 1.00 mg/dL   eGFR 98 >59 mL/min/1.73   BUN/Creatinine Ratio 30 (H) 12 - 28   Sodium 141 134 - 144 mmol/L   Potassium 3.7 3.5 - 5.2 mmol/L   Chloride 100 96 - 106 mmol/L   CO2 23 20 - 29 mmol/L   Calcium 9.7 8.7 - 10.3 mg/dL   Total Protein 7.3 6.0 - 8.5 g/dL   Albumin 4.6 3.8 - 4.8 g/dL   Globulin, Total 2.7 1.5 - 4.5 g/dL   Albumin/Globulin Ratio 1.7 1.2 - 2.2   Bilirubin Total 0.2 0.0 - 1.2 mg/dL   Alkaline Phosphatase 153 (H) 44 - 121 IU/L   AST 28 0 - 40 IU/L   ALT 42 (H) 0 - 32 IU/L  Lipid panel  Result Value Ref Range   Cholesterol, Total 208 (H) 100 - 199 mg/dL   Triglycerides 126 0 - 149 mg/dL   HDL 63 >39 mg/dL   VLDL Cholesterol Cal 22 5 - 40 mg/dL   LDL Chol Calc (NIH) 123 (H) 0 - 99 mg/dL   Chol/HDL Ratio 3.3 0.0 - 4.4 ratio  TSH  Result Value Ref Range   TSH 2.890 0.450 - 4.500 uIU/mL  Urinalysis, Routine w reflex microscopic  Result Value Ref Range   Specific Gravity, UA >1.030 (H) 1.005 - 1.030   pH, UA 5.5 5.0 - 7.5   Color, UA Yellow Yellow   Appearance Ur Cloudy (A) Clear   Leukocytes,UA 1+ (A) Negative   Protein,UA Negative Negative/Trace   Glucose, UA Negative  Negative   Ketones, UA Negative Negative   RBC, UA Negative Negative   Bilirubin, UA Negative Negative   Urobilinogen, Ur 0.2 0.2 - 1.0 mg/dL    Nitrite, UA Negative Negative   Microscopic Examination See below:   HgB A1c  Result Value Ref Range   Hgb A1c MFr Bld 6.3 (H) 4.8 - 5.6 %   Est. average glucose Bld gHb Est-mCnc 134 mg/dL  HIV Antibody (routine testing w rflx)  Result Value Ref Range   HIV Screen 4th Generation wRfx Non Reactive Non Reactive      Assessment & Plan:   Problem List Items Addressed This Visit       Cardiovascular and Mediastinum   Essential hypertension    Chronic.  Controlled.  Continue with current medication regimen of Telimasartan 37m and HCTZ 232mdaily.  Refill sent today.  Labs ordered today.  Return to clinic in 6 months for reevaluation.  Call sooner if concerns arise.        Relevant Medications   rosuvastatin (CRESTOR) 5 MG tablet   telmisartan-hydrochlorothiazide (MICARDIS HCT) 80-25 MG tablet     Other   Hypercholesteremia    Chronic. Controlled on Crestory 60m89maily.  Refill sent today.  Labs ordered.  Follow up in 6 months for reevaluation.       Relevant Medications   rosuvastatin (CRESTOR) 5 MG tablet   telmisartan-hydrochlorothiazide (MICARDIS HCT) 80-25 MG tablet   Other Relevant Orders   Lipid panel   Major depressive disorder, single episode    Chronic.  Controlled without medication. Labs ordered today.  Return to clinic in 6 months for reevaluation.  Call sooner if concerns arise.        Prediabetes    Labs ordered today. Will make recommendations based on lab results.  Follow up in 6 months.  Call sooner if concerns arise.      Relevant Orders   HgB A1c   Other Visit Diagnoses     Annual physical exam    -  Primary   Health maintenance reviewed during visit today. Labs ordered today. Prevnar given during visit. PAP scheduled for tomorrow.   Relevant Orders   CBC with Differential/Platelet   Comprehensive metabolic panel   Lipid panel   TSH   Urinalysis, Routine w reflex microscopic   Arthralgia, unspecified joint       Relevant Medications   meloxicam  (MOBIC) 15 MG tablet   Need for vaccination against Streptococcus pneumoniae       Relevant Orders   Pneumococcal conjugate vaccine 13-valent (Completed)        Follow up plan: Return in about 6 months (around 12/24/2021) for HTN, HLD, DM2 FU.   LABORATORY TESTING:  - Pap smear: done elsewhere  IMMUNIZATIONS:   - Tdap: Tetanus vaccination status reviewed: last tetanus booster within 10 years. - Influenza: Up to date - Pneumovax: Not applicable - Prevnar: Administered today - COVID: Up to date - HPV: Not applicable - Shingrix vaccine: Up to date  SCREENING: -Mammogram: Up to date  - Colonoscopy: Up to date  - Bone Density: Done elsewhere  -Hearing Test: Not applicable  -Spirometry: Not applicable   PATIENT COUNSELING:   Advised to take 1 mg of folate supplement per day if capable of pregnancy.   Sexuality: Discussed sexually transmitted diseases, partner selection, use of condoms, avoidance of unintended pregnancy  and contraceptive alternatives.   Advised to avoid cigarette smoking.  I discussed with the patient that most people either  abstain from alcohol or drink within safe limits (<=14/week and <=4 drinks/occasion for males, <=7/weeks and <= 3 drinks/occasion for females) and that the risk for alcohol disorders and other health effects rises proportionally with the number of drinks per week and how often a drinker exceeds daily limits.  Discussed cessation/primary prevention of drug use and availability of treatment for abuse.   Diet: Encouraged to adjust caloric intake to maintain  or achieve ideal body weight, to reduce intake of dietary saturated fat and total fat, to limit sodium intake by avoiding high sodium foods and not adding table salt, and to maintain adequate dietary potassium and calcium preferably from fresh fruits, vegetables, and low-fat dairy products.    stressed the importance of regular exercise  Injury prevention: Discussed safety belts, safety  helmets, smoke detector, smoking near bedding or upholstery.   Dental health: Discussed importance of regular tooth brushing, flossing, and dental visits.    NEXT PREVENTATIVE PHYSICAL DUE IN 1 YEAR. Return in about 6 months (around 12/24/2021) for HTN, HLD, DM2 FU.

## 2021-06-26 NOTE — Assessment & Plan Note (Signed)
Chronic.  Controlled without medication..  Labs ordered today.  Return to clinic in 6 months for reevaluation.  Call sooner if concerns arise.  ° °

## 2021-06-26 NOTE — Assessment & Plan Note (Signed)
Chronic. Controlled on Crestory 5mg  daily.  Refill sent today.  Labs ordered.  Follow up in 6 months for reevaluation.

## 2021-06-26 NOTE — Assessment & Plan Note (Signed)
Chronic.  Controlled.  Continue with current medication regimen of Telimasartan 80mg  and HCTZ 25mg  daily.  Refill sent today.  Labs ordered today.  Return to clinic in 6 months for reevaluation.  Call sooner if concerns arise.

## 2021-06-27 LAB — HEMOGLOBIN A1C
Est. average glucose Bld gHb Est-mCnc: 128 mg/dL
Hgb A1c MFr Bld: 6.1 % — ABNORMAL HIGH (ref 4.8–5.6)

## 2021-06-27 LAB — COMPREHENSIVE METABOLIC PANEL
ALT: 49 IU/L — ABNORMAL HIGH (ref 0–32)
AST: 32 IU/L (ref 0–40)
Albumin/Globulin Ratio: 1.9 (ref 1.2–2.2)
Albumin: 4.9 g/dL — ABNORMAL HIGH (ref 3.8–4.8)
Alkaline Phosphatase: 176 IU/L — ABNORMAL HIGH (ref 44–121)
BUN/Creatinine Ratio: 18 (ref 12–28)
BUN: 14 mg/dL (ref 8–27)
Bilirubin Total: 0.3 mg/dL (ref 0.0–1.2)
CO2: 27 mmol/L (ref 20–29)
Calcium: 9.6 mg/dL (ref 8.7–10.3)
Chloride: 94 mmol/L — ABNORMAL LOW (ref 96–106)
Creatinine, Ser: 0.76 mg/dL (ref 0.57–1.00)
Globulin, Total: 2.6 g/dL (ref 1.5–4.5)
Glucose: 116 mg/dL — ABNORMAL HIGH (ref 70–99)
Potassium: 4.1 mmol/L (ref 3.5–5.2)
Sodium: 136 mmol/L (ref 134–144)
Total Protein: 7.5 g/dL (ref 6.0–8.5)
eGFR: 87 mL/min/{1.73_m2} (ref >59)

## 2021-06-27 LAB — LIPID PANEL
Chol/HDL Ratio: 3.1 ratio (ref 0.0–4.4)
Cholesterol, Total: 194 mg/dL (ref 100–199)
HDL: 63 mg/dL (ref >39)
LDL Chol Calc (NIH): 106 mg/dL — ABNORMAL HIGH (ref 0–99)
Triglycerides: 144 mg/dL (ref 0–149)
VLDL Cholesterol Cal: 25 mg/dL (ref 5–40)

## 2021-06-27 LAB — CBC WITH DIFFERENTIAL/PLATELET
Basophils Absolute: 0.1 10*3/uL (ref 0.0–0.2)
Basos: 1 %
EOS (ABSOLUTE): 0.1 10*3/uL (ref 0.0–0.4)
Eos: 1 %
Hematocrit: 38.1 % (ref 34.0–46.6)
Hemoglobin: 12.4 g/dL (ref 11.1–15.9)
Immature Grans (Abs): 0 10*3/uL (ref 0.0–0.1)
Immature Granulocytes: 0 %
Lymphocytes Absolute: 1.8 10*3/uL (ref 0.7–3.1)
Lymphs: 24 %
MCH: 29.3 pg (ref 26.6–33.0)
MCHC: 32.5 g/dL (ref 31.5–35.7)
MCV: 90 fL (ref 79–97)
Monocytes Absolute: 0.8 10*3/uL (ref 0.1–0.9)
Monocytes: 11 %
Neutrophils Absolute: 4.7 10*3/uL (ref 1.4–7.0)
Neutrophils: 63 %
Platelets: 380 10*3/uL (ref 150–450)
RBC: 4.23 x10E6/uL (ref 3.77–5.28)
RDW: 12.9 % (ref 11.7–15.4)
WBC: 7.5 10*3/uL (ref 3.4–10.8)

## 2021-06-27 LAB — TSH: TSH: 2.9 u[IU]/mL (ref 0.450–4.500)

## 2021-06-29 NOTE — Progress Notes (Signed)
Hi Michaela Hardy.  It was nice to see you last week.  Overall your lab work looks good.  Your A1c is well controlled at 6.1.  Keep up the good work.  Your liver enzymes are elevated but consistent with prior. We will continue to monitor this in the future. Continue with your current medication regimen.  We will follow up as discussed.

## 2021-07-03 DIAGNOSIS — Z6834 Body mass index (BMI) 34.0-34.9, adult: Secondary | ICD-10-CM | POA: Diagnosis not present

## 2021-07-03 DIAGNOSIS — Z124 Encounter for screening for malignant neoplasm of cervix: Secondary | ICD-10-CM | POA: Diagnosis not present

## 2021-07-03 DIAGNOSIS — Z01419 Encounter for gynecological examination (general) (routine) without abnormal findings: Secondary | ICD-10-CM | POA: Diagnosis not present

## 2021-07-03 LAB — HM PAP SMEAR

## 2021-08-14 ENCOUNTER — Ambulatory Visit
Admission: RE | Admit: 2021-08-14 | Discharge: 2021-08-14 | Disposition: A | Discharge status: Home / Self Care | Payer: BLUE CROSS/BLUE SHIELD | Source: Ambulatory Visit | Attending: Obstetrics and Gynecology | Admitting: Obstetrics and Gynecology

## 2021-08-14 ENCOUNTER — Other Ambulatory Visit: Payer: Self-pay | Admitting: Obstetrics and Gynecology

## 2021-08-14 DIAGNOSIS — R922 Inconclusive mammogram: Secondary | ICD-10-CM | POA: Diagnosis not present

## 2021-08-14 DIAGNOSIS — N6489 Other specified disorders of breast: Secondary | ICD-10-CM

## 2021-08-14 DIAGNOSIS — R928 Other abnormal and inconclusive findings on diagnostic imaging of breast: Secondary | ICD-10-CM

## 2021-10-29 ENCOUNTER — Ambulatory Visit: Payer: BLUE CROSS/BLUE SHIELD | Admitting: Internal Medicine

## 2021-11-24 ENCOUNTER — Other Ambulatory Visit: Payer: Self-pay | Admitting: Family Medicine

## 2021-11-24 ENCOUNTER — Ambulatory Visit
Admission: RE | Admit: 2021-11-24 | Discharge: 2021-11-24 | Disposition: A | Discharge status: Home / Self Care | Payer: BLUE CROSS/BLUE SHIELD | Source: Ambulatory Visit | Attending: Family Medicine | Admitting: Family Medicine

## 2021-11-24 ENCOUNTER — Encounter: Payer: Self-pay | Admitting: Family Medicine

## 2021-11-24 ENCOUNTER — Ambulatory Visit: Payer: BLUE CROSS/BLUE SHIELD | Admitting: Family Medicine

## 2021-11-24 ENCOUNTER — Ambulatory Visit
Admission: RE | Admit: 2021-11-24 | Discharge: 2021-11-24 | Disposition: A | Discharge status: Home / Self Care | Payer: BLUE CROSS/BLUE SHIELD | Attending: Family Medicine | Admitting: Family Medicine

## 2021-11-24 VITALS — BP 127/73 | HR 87 | Temp 97.6°F | Wt 212.4 lb

## 2021-11-24 DIAGNOSIS — M5442 Lumbago with sciatica, left side: Secondary | ICD-10-CM | POA: Insufficient documentation

## 2021-11-24 DIAGNOSIS — G8929 Other chronic pain: Secondary | ICD-10-CM

## 2021-11-24 DIAGNOSIS — M5136 Other intervertebral disc degeneration, lumbar region: Secondary | ICD-10-CM | POA: Diagnosis not present

## 2021-11-24 DIAGNOSIS — M4726 Other spondylosis with radiculopathy, lumbar region: Secondary | ICD-10-CM

## 2021-11-24 DIAGNOSIS — M545 Low back pain, unspecified: Secondary | ICD-10-CM | POA: Diagnosis not present

## 2021-11-24 MED ORDER — CYCLOBENZAPRINE HCL 10 MG PO TABS: 5.0000 mg | ORAL_TABLET | Freq: Every evening | ORAL | 0 refills | Status: DC | PRN

## 2021-11-24 NOTE — Progress Notes (Signed)
BP 127/73   Pulse 87   Temp 97.6 F (36.4 C)   Wt 212 lb 6.4 oz (96.3 kg)   SpO2 98%   BMI 33.67 kg/m    Subjective:    Patient ID: Michaela Hardy, female    DOB: 06/15/1955, 66 y.o.   MRN: [id number]  HPI: Michaela Hardy is a 66 y.o. female  Chief Complaint  Patient presents with   Leg Pain    Patient states she hurt her left leg last year in August, came to see PCP and was sent to PT. Patient felt like pain had improved in PT but still experiences discomfort and aching when she walks a lot. Patient needs to use cain when she walks around a lot.    BACK PAIN Duration: about a year Mechanism of injury: water aerobics Location:  and L leg, Left, and low back Onset: sudden Severity: moderate Quality: aching Frequency: intermittent Radiation: buttocks and L leg below the knee Aggravating factors: movement Alleviating factors: PT, home exercises, ibuprofen Status: worse Treatments attempted: rest, ice, heat, APAP, ibuprofen, aleve, physical therapy, and HEP  Relief with NSAIDs?: significant Nighttime pain:  no Paresthesias / decreased sensation:  no Bowel / bladder incontinence:  no Fevers:  no Dysuria / urinary frequency:  no  Relevant past medical, surgical, family and social history reviewed and updated as indicated. Interim medical history since our last visit reviewed. Allergies and medications reviewed and updated.  Review of Systems  Constitutional: Negative.   Respiratory: Negative.    Cardiovascular: Negative.   Musculoskeletal:  Positive for back pain and myalgias. Negative for arthralgias, gait problem, joint swelling, neck pain and neck stiffness.  Skin: Negative.   Neurological: Negative.   Psychiatric/Behavioral: Negative.      Per HPI unless specifically indicated above     Objective:    BP 127/73   Pulse 87   Temp 97.6 F (36.4 C)   Wt 212 lb 6.4 oz (96.3 kg)   SpO2 98%   BMI 33.67 kg/m   Wt Readings from Last 3 Encounters:   11/24/21 212 lb 6.4 oz (96.3 kg)  06/26/21 216 lb (98 kg)  12/10/20 215 lb 6.4 oz (97.7 kg)    Physical Exam Vitals and nursing note reviewed.  Constitutional:      General: She is not in acute distress.    Appearance: Normal appearance. She is not ill-appearing, toxic-appearing or diaphoretic.  HENT:     Head: Normocephalic and atraumatic.     Right Ear: External ear normal.     Left Ear: External ear normal.     Nose: Nose normal.     Mouth/Throat:     Mouth: Mucous membranes are moist.     Pharynx: Oropharynx is clear.  Eyes:     General: No scleral icterus.       Right eye: No discharge.        Left eye: No discharge.     Extraocular Movements: Extraocular movements intact.     Conjunctiva/sclera: Conjunctivae normal.     Pupils: Pupils are equal, round, and reactive to light.  Cardiovascular:     Rate and Rhythm: Normal rate and regular rhythm.     Pulses: Normal pulses.     Heart sounds: Normal heart sounds. No murmur heard.    No friction rub. No gallop.  Pulmonary:     Effort: Pulmonary effort is normal. No respiratory distress.     Breath sounds: Normal breath sounds. No stridor.  No wheezing, rhonchi or rales.  Chest:     Chest wall: No tenderness.  Musculoskeletal:        General: Normal range of motion.     Cervical back: Normal range of motion and neck supple.  Skin:    General: Skin is warm and dry.     Capillary Refill: Capillary refill takes less than 2 seconds.     Coloration: Skin is not jaundiced or pale.     Findings: No bruising, erythema, lesion or rash.  Neurological:     General: No focal deficit present.     Mental Status: She is alert and oriented to person, place, and time. Mental status is at baseline.  Psychiatric:        Mood and Affect: Mood normal.        Behavior: Behavior normal.        Thought Content: Thought content normal.        Judgment: Judgment normal.     Results for orders placed or performed in visit on 07/22/21  HM  PAP SMEAR  Result Value Ref Range   HM Pap smear see result scanned into chart       Assessment & Plan:   Problem List Items Addressed This Visit   None Visit Diagnoses     Chronic left-sided low back pain with left-sided sciatica    -  Primary   Will obtain x-ray. Await results. Treat as needed.    Relevant Orders   DG Lumbar Spine Complete        Follow up plan: Return as scheduled.

## 2021-11-25 ENCOUNTER — Telehealth: Payer: Self-pay

## 2021-11-25 NOTE — Telephone Encounter (Signed)
Per [facility], she has [employer] EPO they are not in network with them. [facility] only is in network with P[facility].

## 2021-12-01 ENCOUNTER — Telehealth: Payer: Self-pay

## 2021-12-01 NOTE — Telephone Encounter (Signed)
Copied from [place] [id number]. Topic: Referral - Status >> Dec 01, 2021 10:15 AM Cyndi Bender wrote: Reason for CRM: Pt stated she received a referral for physical therapy but she was told by that location that her insurance is not accepted. Pt requests that a referral request be sent to  Physical Therapy. Pt stated she was referred there previously and they accept her insurance.   Evelena Peat, can referral be resent please?

## 2021-12-01 NOTE — Telephone Encounter (Signed)
Called and notified patient that referral has been resent as requested.

## 2021-12-13 ENCOUNTER — Other Ambulatory Visit: Payer: Self-pay | Admitting: Nurse Practitioner

## 2021-12-14 NOTE — Telephone Encounter (Signed)
Requested Prescriptions  Pending Prescriptions Disp Refills  . rosuvastatin (CRESTOR) 5 MG tablet [Pharmacy Med Name: ROSUVASTATIN CALCIUM 5 MG TAB] 90 tablet 1    Sig: TAKE ONE TABLET BY MOUTH DAILY     Cardiovascular:  Antilipid - Statins 2 Failed - 12/13/2021  2:31 PM      Failed - Lipid Panel in normal range within the last 12 months    Cholesterol, Total  Date Value Ref Range Status  06/26/2021 194 100 - 199 mg/dL Final   LDL Chol Calc (NIH)  Date Value Ref Range Status  06/26/2021 106 (H) 0 - 99 mg/dL Final   HDL  Date Value Ref Range Status  06/26/2021 63 >39 mg/dL Final   Triglycerides  Date Value Ref Range Status  06/26/2021 144 0 - 149 mg/dL Final         Passed - Cr in normal range and within 360 days    Creatinine, Ser  Date Value Ref Range Status  06/26/2021 0.76 0.57 - 1.00 mg/dL Final         Passed - Patient is not pregnant      Passed - Valid encounter within last 12 months    Recent Outpatient Visits          2 weeks ago Chronic left-sided low back pain with left-sided sciatica   [place], Megan P, DO   5 months ago Annual physical exam   [facility] Jon Billings, NP   11 months ago Muscle strain   [facility] Jon Billings, NP   1 year ago Muscle strain of left thigh, initial encounter   [place] [facility], Scheryl Darter, NP   1 year ago Annual physical exam   [facility] Jon Billings, NP      Future Appointments            In 2 weeks Jon Billings, NP [facility], [place]

## 2021-12-22 ENCOUNTER — Ambulatory Visit: Payer: BLUE CROSS/BLUE SHIELD | Attending: Family Medicine | Admitting: Physical Therapy

## 2021-12-22 ENCOUNTER — Other Ambulatory Visit: Payer: Self-pay

## 2021-12-22 ENCOUNTER — Encounter: Payer: Self-pay | Admitting: Physical Therapy

## 2021-12-22 DIAGNOSIS — M25552 Pain in left hip: Secondary | ICD-10-CM | POA: Insufficient documentation

## 2021-12-22 DIAGNOSIS — R262 Difficulty in walking, not elsewhere classified: Secondary | ICD-10-CM | POA: Insufficient documentation

## 2021-12-22 DIAGNOSIS — M5459 Other low back pain: Secondary | ICD-10-CM | POA: Diagnosis not present

## 2021-12-22 DIAGNOSIS — M4726 Other spondylosis with radiculopathy, lumbar region: Secondary | ICD-10-CM | POA: Insufficient documentation

## 2021-12-22 NOTE — Therapy (Unsigned)
OUTPATIENT PHYSICAL THERAPY THORACOLUMBAR EVALUATION   Patient Name: Michaela Hardy MRN: [id number] DOB:[birth year], 66 y.o., female Today's Date: 12/23/2021   PT End of Session - 12/23/21 0908     Visit Number 1    Number of Visits 16    Date for PT Re-Evaluation 02/17/22    Authorization Type BCBS    Authorization Time Period 12/22/21-02/17/22    Authorization - Visit Number 1    Authorization - Number of Visits 120    Progress Note Due on Visit 10    PT Start Time 1500    PT Stop Time 1545    PT Time Calculation (min) 45 min    Activity Tolerance Patient tolerated treatment well    Behavior During Therapy WFL for tasks assessed/performed             Past Medical History:  Diagnosis Date   Anxiety    Depression    GERD (gastroesophageal reflux disease)    Hepatitis    Primary bilary cholangitis   Hyperlipidemia    Hypertension    PONV (postoperative nausea and vomiting)    Past Surgical History:  Procedure Laterality Date   APPENDECTOMY  1976   BREAST BIOPSY Left 10/03/2014   Dr. Autumn Patty   BREAST EXCISIONAL BIOPSY Left    BREAST LUMPECTOMY WITH RADIOACTIVE SEED LOCALIZATION Left 11/21/2014   Procedure: LEFT BREAST LUMPECTOMY WITH RADIOACTIVE SEED LOCALIZATION;  Surgeon: Autumn Messing III, MD;  Location: [facility];  Service: General;  Laterality: Left;   CERVICAL BIOPSY  W/ LOOP ELECTRODE EXCISION     CHOLECYSTECTOMY  06/29/2007   LIVER BIOPSY  2008   TUBAL LIGATION  1987   Patient Active Problem List   Diagnosis Date Noted   Prediabetes 11/26/2020   Disease of liver 05/25/2019   Biliary cirrhosis ([place]) 05/25/2019   Elevated blood sugar 10/20/2015   Allergic sinusitis 01/14/2015   Eustachian tube dysfunction 01/14/2015   Primary biliary cholangitis ([place]) 11/15/2014   Other migraine, not intractable, without status migrainosus 08/10/2014   Depressive disorder 08/10/2014   Acid reflux 08/10/2014   Other specified bacterial intestinal infections  08/10/2014   Hypercholesteremia 08/10/2014   Essential hypertension 08/10/2014   Insomnia 08/10/2014   Gastro-esophageal reflux disease without esophagitis 08/10/2014   Major depressive disorder, single episode 08/10/2014    PCP: Jon Billings NP   REFERRING PROVIDER: Dr. Park Liter   REFERRING DIAG: [phone] (ICD-10-CM) - Osteoarthritis of spine with radiculopathy, lumbar region  Rationale for Evaluation and Treatment Rehabilitation  THERAPY DIAG:  Other low back pain  Pain in left hip  ONSET DATE: 12/08/20  SUBJECTIVE:  SUBJECTIVE STATEMENT: She describes that pain has worsened to the point where she is now using a cane to tolerate walking. She reports taking Tylenol before coming to PT session. She has been redoing her bathroom which is causing her increased low back pain.   PERTINENT HISTORY:  Last August pt sustained a left leg injury doing water aerobics. She had lingering pain that resolved after completing a bout of PT, but it has since returned along with low back pain to point where she needs to use AD when ambulating over uneven terrain.   PAIN:  Are you having pain? Yes: NPRS scale: 5/10 Pain location: Bilateral low back and radiates down lateral side of left leg Pain description: Achy  Aggravating factors: Walking and doing physical activity  Relieving factors: Using cane, lidocane cream helps    PRECAUTIONS: None  WEIGHT BEARING RESTRICTIONS No  FALLS:  Has patient fallen in last 6 months? 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    PLOF: Independent  PATIENT GOALS Feel less hip and low back pain. Walk and play golf this fall.    OBJECTIVE:              VITALS: BP 160/70 HR 90 SpO2 99  DIAGNOSTIC FINDINGS:  CLINICAL DATA:  Low back pain   EXAM: LUMBAR SPINE - COMPLETE 4+ VIEW   COMPARISON:  None Available.   FINDINGS: Lumbar vertebral body height are preserved without evidence of fracture. Grade 1 anterolisthesis of L4 on L5. No spondylolysis identified. Intervertebral disc spaces are preserved. Mild facet arthropathy. Calcified plaques in the abdominal aorta.   IMPRESSION: Degenerative changes of the lumbar spine as described.     PATIENT SURVEYS:  FOTO 45/100 with target of 10    SCREENING FOR RED FLAGS: Bowel or bladder incontinence: No Spinal tumors: No Cauda equina syndrome: No Compression fracture: No Abdominal aneurysm: No  COGNITION:  Overall cognitive status: Within functional limits for tasks assessed     SENSATION: WFL  MUSCLE LENGTH: Hamstrings: Right 90 deg; Left 90 deg Thomas test: Negative bilateral  POSTURE: No Significant postural limitations  PALPATION:Left glute med and bilateral low back.     LUMBAR ROM:   Active  A/PROM  eval  Flexion 100  Extension 100  Right lateral flexion 100  Left lateral flexion 100  Right rotation 100  Left rotation 100   (Blank rows = not tested)  LOWER EXTREMITY ROM:       Active  Right 12/22/2021 Left 12/22/2021  Hip flexion 120 120  Hip extension 30 30  Hip abduction 45 45  Hip adduction 30 30  Hip internal rotation 45 45  Hip external rotation 45 25*  Knee flexion 135 135  Knee extension 0 0   (Blank rows = not tested)     LOWER EXTREMITY MMT:    MMT Right eval Left eval  Hip flexion 5 5  Hip extension 4- 4-  Hip abduction 4 4  Hip adduction 4 4  Hip internal rotation    Hip external rotation    Knee flexion 5 5  Knee extension 5 5  Ankle dorsiflexion 5 5  Ankle plantarflexion    Ankle inversion     Ankle eversion     (Blank rows =  not tested)  LUMBAR SPECIAL TESTS:  Straight leg raise test: Negative, FABER test: Negative, Thomas test: Negative, and FADIR Negative   FUNCTIONAL TESTS:  None performed   GAIT: Distance walked: 40 ft  Assistive device utilized: None Level of assistance: Complete Independence Comments: No gait abnormalities noted     TODAY'S TREATMENT  Quad prone stretch 2 x 30 sec  Seated ER stretch with use of strap 4 x 30 sec    PATIENT EDUCATION:  Education details: form and technique for appropriate exercise. Explanation about plan of care   Person educated: Patient Education method: Explanation, Demonstration, Verbal cues, and Handouts Education comprehension: verbalized understanding, returned demonstration, verbal cues required, and tactile cues required   HOME EXERCISE PROGRAM: Access Code: [email or web address] URL: [email or web address] Date: 12/23/2021 Prepared by: Bradly Chris  Exercises - Prone Quadriceps Stretch with Strap  - 1 x daily - 7 x weekly - 3 reps - 30 hold - Seated Hip External Rotation Stretch  - 1 x daily - 7 x weekly - 3 reps - 30 hold  ASSESSMENT:  CLINICAL IMPRESSION: Patient is a 66 y.o. white female who was seen today for physical therapy evaluation and treatment for bilateral low back and left sided hip pain. She exhibits decreased left hip ROM and bilateral hip strength along with increased low back and left hip pain. Left sided lumbar radiculopathy ruled out during visit. Pt will benefit from skilled PT to improve hip mobility and strength to help resolve low back and hip pain to return to walking and playing recreational sports without needing to limit activity due to pain.     OBJECTIVE IMPAIRMENTS decreased endurance, difficulty walking, decreased ROM, decreased strength, obesity, and pain.   ACTIVITY LIMITATIONS bending, sitting, standing, squatting, sleeping, and locomotion level  PARTICIPATION  LIMITATIONS: cleaning, laundry, driving, shopping, community activity, yard work, and playing recreational sports   [place] Age, Past/current experiences, and Time since onset of injury/illness/exacerbation are also affecting patient's functional outcome.   REHAB POTENTIAL: Fair chronic condition that did not fully resolve with first bout of PT  CLINICAL DECISION MAKING: Stable/uncomplicated  EVALUATION COMPLEXITY: Low   GOALS: Goals reviewed with patient? No  SHORT TERM GOALS: Target date: 01/06/2022  Pt will be independent with HEP in order to improve strength and balance in order to decrease fall risk and improve function at home and work. Baseline: NT  Goal status: INITIAL  2. Pt will increase 6MWT by at least 44m(1630f in order to demonstrate clinically significant improvement in cardiopulmonary endurance and community ambulation. Baseline: NT  Goal status: INITIAL    LONG TERM GOALS: Target date: 02/17/2022  Patient will have improved function and activity level as evidenced by an increase in FOTO score by 10 points or more.  Baseline: 45/100 with target of 61  Goal status: INITIAL  2.  Patient will improve left hip ROM to be symmetrical to right for improved biomechanics and joint loading during walking and transfers to decrease pain and return to recreational activities.  Baseline: Hip ER R/L 45/25  Goal status: INITIAL  3.  Patient will improve hip strength by 1/2 MMT to better support spinal structures to offload mechanical forces and to improve pain response to activity.  Baseline: Hip Abduction R/L 4/4, Hip Adduction R/L 4/4, Hip Ext R/L 4-/4-  Goal status: INITIAL  4. Patient will ambulate >= 1,000 ft without needing a standing rest break due to pain during 53m11mto show decreased pain in low back and left  hip resulting in improved aerobic endurance.  Baseline: NT  Goal status: INITIAL   PLAN: PT FREQUENCY: 1-2x/week  PT DURATION: 8  weeks  PLANNED INTERVENTIONS: Therapeutic exercises, Neuromuscular re-education, Balance training, Gait training, Patient/Family education, Self Care, Joint mobilization, Aquatic Therapy, Dry Needling, Electrical stimulation, Spinal manipulation, Spinal mobilization, Cryotherapy, Moist heat, Manual therapy, and Re-evaluation.  PLAN FOR NEXT SESSION: 61mt, progress hip mobility and strength exercises   DBradly ChrisPT, DPT  12/23/2021, 9:14 AM

## 2021-12-24 ENCOUNTER — Ambulatory Visit: Payer: BLUE CROSS/BLUE SHIELD | Admitting: Physical Therapy

## 2021-12-24 ENCOUNTER — Encounter: Payer: Self-pay | Admitting: Physical Therapy

## 2021-12-24 DIAGNOSIS — R262 Difficulty in walking, not elsewhere classified: Secondary | ICD-10-CM

## 2021-12-24 DIAGNOSIS — M25552 Pain in left hip: Secondary | ICD-10-CM

## 2021-12-24 DIAGNOSIS — M5459 Other low back pain: Secondary | ICD-10-CM

## 2021-12-24 DIAGNOSIS — M4726 Other spondylosis with radiculopathy, lumbar region: Secondary | ICD-10-CM | POA: Diagnosis not present

## 2021-12-24 NOTE — Therapy (Signed)
OUTPATIENT PHYSICAL THERAPY TREATMENT NOTE   Patient Name: Michaela Hardy MRN: [id number] DOB:1956-03-12, 66 y.o., female Today's Date: 12/24/2021  PCP: Jon Billings NP  REFERRING PROVIDER: Dr. Park Liter   END OF SESSION:   PT End of Session - 12/24/21 1421     Visit Number 2    Number of Visits 16    Date for PT Re-Evaluation 02/17/22    Authorization Type BCBS    Authorization Time Period 12/22/21-02/17/22    Authorization - Visit Number 2    Authorization - Number of Visits 120    Progress Note Due on Visit 10    PT Start Time 8563    PT Stop Time 1500    PT Time Calculation (min) 40 min    Activity Tolerance Patient tolerated treatment well    Behavior During Therapy WFL for tasks assessed/performed             Past Medical History:  Diagnosis Date   Anxiety    Depression    GERD (gastroesophageal reflux disease)    Hepatitis    Primary bilary cholangitis   Hyperlipidemia    Hypertension    PONV (postoperative nausea and vomiting)    Past Surgical History:  Procedure Laterality Date   APPENDECTOMY  1976   BREAST BIOPSY Left 10/03/2014   Dr. Autumn Patty   BREAST EXCISIONAL BIOPSY Left    BREAST LUMPECTOMY WITH RADIOACTIVE SEED LOCALIZATION Left 11/21/2014   Procedure: LEFT BREAST LUMPECTOMY WITH RADIOACTIVE SEED LOCALIZATION;  Surgeon: Autumn Messing III, MD;  Location: [facility];  Service: General;  Laterality: Left;   CERVICAL BIOPSY  W/ LOOP ELECTRODE EXCISION     CHOLECYSTECTOMY  06/29/2007   LIVER BIOPSY  2008   TUBAL LIGATION  1987   Patient Active Problem List   Diagnosis Date Noted   Prediabetes 11/26/2020   Disease of liver 05/25/2019   Biliary cirrhosis ([place]) 05/25/2019   Elevated blood sugar 10/20/2015   Allergic sinusitis 01/14/2015   Eustachian tube dysfunction 01/14/2015   Primary biliary cholangitis ([place]) 11/15/2014   Other migraine, not intractable, without status migrainosus 08/10/2014   Depressive disorder 08/10/2014    Acid reflux 08/10/2014   Other specified bacterial intestinal infections 08/10/2014   Hypercholesteremia 08/10/2014   Essential hypertension 08/10/2014   Insomnia 08/10/2014   Gastro-esophageal reflux disease without esophagitis 08/10/2014   Major depressive disorder, single episode 08/10/2014    REFERRING DIAG: M47.26 (ICD-10-CM) - Osteoarthritis of spine with radiculopathy, lumbar region  THERAPY DIAG:  Other low back pain  Pain in left hip  Difficulty in walking, not elsewhere classified  Rationale for Evaluation and Treatment Rehabilitation  PERTINENT HISTORY: Last August pt sustained a left leg injury doing water aerobics. She had lingering pain that resolved after completing a bout of PT, but it has since returned along with low back pain to point where she needs to use AD when ambulating over uneven terrain.  PRECAUTIONS: None   SUBJECTIVE: Pt reports improved pain since starting the exercises and she was able to walk around store without increased pain.   PAIN:  Are you having pain? Yes: NPRS scale: 2-3/10 Pain location: Left groin  Pain description: Achy  Aggravating factors: Standing or weight bearing activity  Relieving factors: Non-weight bearing    OBJECTIVE: (objective measures completed at initial evaluation unless otherwise dated)  VITALS: BP 160/70 HR 90 SpO2 99   DIAGNOSTIC FINDINGS:  CLINICAL DATA:  Low back pain   EXAM: LUMBAR SPINE -  COMPLETE 4+ VIEW   COMPARISON:  None Available.   FINDINGS: Lumbar vertebral body height are preserved without evidence of fracture. Grade 1 anterolisthesis of L4 on L5. No spondylolysis identified. Intervertebral disc spaces are preserved. Mild facet arthropathy. Calcified plaques in the abdominal aorta.   IMPRESSION: Degenerative changes of the lumbar spine as described.     PATIENT SURVEYS:  FOTO 45/100 with target of 46     SCREENING FOR RED FLAGS: Bowel or bladder incontinence: No Spinal tumors:  No Cauda equina syndrome: No Compression fracture: No Abdominal aneurysm: No   COGNITION:           Overall cognitive status: Within functional limits for tasks assessed                          SENSATION: WFL   MUSCLE LENGTH: Hamstrings: Right 90 deg; Left 90 deg Thomas test: Negative bilateral   POSTURE: No Significant postural limitations   PALPATION:Left glute med and bilateral low back.        LUMBAR ROM:    Active  A/PROM  eval  Flexion 100  Extension 100  Right lateral flexion 100  Left lateral flexion 100  Right rotation 100  Left rotation 100   (Blank rows = not tested)   LOWER EXTREMITY ROM:          Active  Right 12/22/2021 Left 12/22/2021  Hip flexion 120 120  Hip extension 30 30  Hip abduction 45 45  Hip adduction 30 30  Hip internal rotation 45 45  Hip external rotation 45 25*  Knee flexion 135 135  Knee extension 0 0   (Blank rows = not tested)        LOWER EXTREMITY MMT:     MMT Right eval Left eval  Hip flexion 5 5  Hip extension 4- 4-  Hip abduction 4 4  Hip adduction 4 4  Hip internal rotation  4+ 4+   Hip external rotation 4+  4+   Knee flexion 5 5  Knee extension 5 5  Ankle dorsiflexion 5 5  Ankle plantarflexion      Ankle inversion      Ankle eversion       (Blank rows = not tested)   LUMBAR SPECIAL TESTS:  Straight leg raise test: Negative, FABER test: Negative, Thomas test: Negative, and FADIR Negative    FUNCTIONAL TESTS:  None performed    GAIT: Distance walked: 40 ft  Assistive device utilized: None Level of assistance: Complete Independence Comments: No gait abnormalities noted        TODAY'S TREATMENT   12/24/21:   Nu-Step Seat and arm level 9 with 1 resistance for 5 min  42mT: 1,050 ft  Hip ER R/L 4+/4+ Hip IR R/L 4+/4+  Standing Hip Abduction 2 x 10  -Pt reports increased pain in her left hip   Side Lying Hip Abduction 3 x 10 on LLE SLR with LLE 1 x 10              -Pt reports increased  pain in left groin area             Standing marches with BUE on LLE 1 x 10             Standing marches with BUE on LLE with #3 AW 1 x 10                  Initial  Quad prone stretch 2 x 30 sec  Seated ER stretch with use of strap 4 x 30 sec      PATIENT EDUCATION:  Education details: form and technique for appropriate exercise. Explanation about plan of care   Person educated: Patient Education method: Explanation, Demonstration, Verbal cues, and Handouts Education comprehension: verbalized understanding, returned demonstration, verbal cues required, and tactile cues required     HOME EXERCISE PROGRAM: Access Code: [email or web address] URL: [email or web address] Date: 12/24/2021 Prepared by: Bradly Chris  Exercises - Prone Quadriceps Stretch with Strap  - 1 x daily - 7 x weekly - 3 reps - 30 hold - Seated Hip External Rotation Stretch  - 1 x daily - 7 x weekly - 3 reps - 30 hold - Sidelying Hip Abduction  - 1 x daily - 3 x weekly - 3 sets - 10 reps   ASSESSMENT:   CLINICAL IMPRESSION: Pt presents for f/u for left hip pain and she exhibits increased pain with hip flexion and weight bearing on single leg. She was able to complete all exercises during session, and she was not limited by left hip pain for 37mT and exhibits community ambulator distances with >1000 ft. She will continue to benefit from skilled PT to improve hip mobility and strength to help resolve low back and hip pain to return to walking and playing recreational sports without needing to limit activity due to pain.     OBJECTIVE IMPAIRMENTS decreased endurance, difficulty walking, decreased ROM, decreased strength, obesity, and pain.    ACTIVITY LIMITATIONS bending, sitting, standing, squatting, sleeping, and locomotion level   PARTICIPATION LIMITATIONS: cleaning, laundry, driving, shopping, community activity, yard work, and playing recreational sports    [place], Past/current experiences, and  Time since onset of injury/illness/exacerbation are also affecting patient's functional outcome.    REHAB POTENTIAL: Fair chronic condition that did not fully resolve with first bout of PT   CLINICAL DECISION MAKING: Stable/uncomplicated   EVALUATION COMPLEXITY: Low     GOALS: Goals reviewed with patient? No   SHORT TERM GOALS: Target date: 01/06/2022   Pt will be independent with HEP in order to improve strength and balance in order to decrease fall risk and improve function at home and work. Baseline: NT  Goal status: INITIAL   2. Pt will increase 6MWT by at least [id number] order to demonstrate clinically significant improvement in cardiopulmonary endurance and community ambulation. Baseline: NT  Goal status: INITIAL       LONG TERM GOALS: Target date: 02/17/2022   Patient will have improved function and activity level as evidenced by an increase in FOTO score by 10 points or more.  Baseline: 45/100 with target of 61  Goal status: INITIAL   2.  Patient will improve left hip ROM to be symmetrical to right for improved biomechanics and joint loading during walking and transfers to decrease pain and return to recreational activities.  Baseline: Hip ER R/L 45/25  Goal status: INITIAL   3.  Patient will improve hip strength by 1/2 MMT to better support spinal structures to offload mechanical forces and to improve pain response to activity.  Baseline: Hip Abduction R/L 4/4, Hip Adduction R/L 4/4, Hip Ext R/L 4-/4-  Goal status: INITIAL   4. Patient will ambulate >= 1,000 ft without needing a standing rest break due to pain during 6mW72mo show decreased pain in low back and left hip resulting in improved aerobic endurance.  Baseline: 1,050 ft  Goal status: Achieved  PLAN: PT FREQUENCY: 1-2x/week   PT DURATION: 8 weeks   PLANNED INTERVENTIONS: Therapeutic exercises, Neuromuscular re-education, Balance training, Gait training, Patient/Family education, Self Care,  Joint mobilization, Aquatic Therapy, Dry Needling, Electrical stimulation, Spinal manipulation, Spinal mobilization, Cryotherapy, Moist heat, Manual therapy, and Re-evaluation.   PLAN FOR NEXT SESSION: Progress hip mobility and strength exercises      Bradly Chris PT, DPT  12/24/2021, 2:22 PM

## 2021-12-25 ENCOUNTER — Ambulatory Visit: Payer: BLUE CROSS/BLUE SHIELD | Admitting: Nurse Practitioner

## 2021-12-28 ENCOUNTER — Encounter: Payer: Self-pay | Admitting: Nurse Practitioner

## 2021-12-28 ENCOUNTER — Ambulatory Visit: Payer: BLUE CROSS/BLUE SHIELD | Admitting: Nurse Practitioner

## 2021-12-28 VITALS — BP 137/73 | HR 85 | Temp 98.0°F | Wt 214.6 lb

## 2021-12-28 DIAGNOSIS — K745 Biliary cirrhosis, unspecified: Secondary | ICD-10-CM | POA: Diagnosis not present

## 2021-12-28 DIAGNOSIS — I10 Essential (primary) hypertension: Secondary | ICD-10-CM

## 2021-12-28 DIAGNOSIS — E7800 Pure hypercholesterolemia, unspecified: Secondary | ICD-10-CM | POA: Diagnosis not present

## 2021-12-28 DIAGNOSIS — F329 Major depressive disorder, single episode, unspecified: Secondary | ICD-10-CM

## 2021-12-28 DIAGNOSIS — F32A Depression, unspecified: Secondary | ICD-10-CM

## 2021-12-28 DIAGNOSIS — R7303 Prediabetes: Secondary | ICD-10-CM | POA: Diagnosis not present

## 2021-12-28 MED ORDER — TELMISARTAN-HCTZ 80-25 MG PO TABS: 1.0000 | ORAL_TABLET | Freq: Every day | ORAL | 1 refills | Status: DC

## 2021-12-28 NOTE — Assessment & Plan Note (Signed)
Chronic.  Controlled.  Continue with current medication regimen of Telimisartan.  Refills sent today.  Labs ordered today.  Return to clinic in 6 months for reevaluation.  Call sooner if concerns arise.

## 2021-12-28 NOTE — Assessment & Plan Note (Signed)
Chronic.  Controlled without medication..  Labs ordered today.  Return to clinic in 6 months for reevaluation.  Call sooner if concerns arise.  ° °

## 2021-12-28 NOTE — Assessment & Plan Note (Signed)
Chronic.  Controlled.  Continue with current medication regimen.  Labs ordered today.  Return to clinic in 6 months for reevaluation.  Call sooner if concerns arise.  ? ?

## 2021-12-28 NOTE — Progress Notes (Signed)
BP 137/73   Pulse 85   Temp 98 F (36.7 C) (Oral)   Wt 214 lb 9.6 oz (97.3 kg)   SpO2 98%   BMI 34.02 kg/m    Subjective:    Patient ID: Michaela Hardy, female    DOB: [birth year], 66 y.o.   MRN: [id number]  HPI: Michaela Hardy is a 66 y.o. female  Chief Complaint  Patient presents with   Hypertension   Hyperlipidemia   Diabetes    6 month follow up    HYPERTENSION / [place] Satisfied with current treatment? yes Duration of hypertension: years BP monitoring frequency: not checking BP range:  BP medication side effects: no Past BP meds:  Temisartan with HCTZ Duration of hyperlipidemia: years Cholesterol medication side effects: no Cholesterol supplements: none Past cholesterol medications: rosuvastatin (crestor) Medication compliance: excellent compliance Aspirin: no Recent stressors: no Recurrent headaches: no Visual changes: no Palpitations: no Dyspnea: no Chest pain: no Lower extremity edema: no Dizzy/lightheaded: no  MOOD Feels like her mood has been fine.  She is doing well and denies concerns at visit today.    Relevant past medical, surgical, family and social history reviewed and updated as indicated. Interim medical history since our last visit reviewed. Allergies and medications reviewed and updated.  Review of Systems  Eyes:  Negative for visual disturbance.  Respiratory:  Negative for cough, chest tightness and shortness of breath.   Cardiovascular:  Negative for chest pain, palpitations and leg swelling.  Neurological:  Negative for dizziness and headaches.  Psychiatric/Behavioral:  Negative for dysphoric mood and suicidal ideas. The patient is not nervous/anxious.     Per HPI unless specifically indicated above     Objective:    BP 137/73   Pulse 85   Temp 98 F (36.7 C) (Oral)   Wt 214 lb 9.6 oz (97.3 kg)   SpO2 98%   BMI 34.02 kg/m   Wt Readings from Last 3 Encounters:  12/28/21 214 lb 9.6 oz (97.3 kg)  11/24/21 212  lb 6.4 oz (96.3 kg)  06/26/21 216 lb (98 kg)    Physical Exam Vitals and nursing note reviewed.  Constitutional:      General: She is not in acute distress.    Appearance: Normal appearance. She is obese. She is not ill-appearing, toxic-appearing or diaphoretic.  HENT:     Head: Normocephalic.     Right Ear: External ear normal.     Left Ear: External ear normal.     Nose: Nose normal.     Mouth/Throat:     Mouth: Mucous membranes are moist.     Pharynx: Oropharynx is clear.  Eyes:     General:        Right eye: No discharge.        Left eye: No discharge.     Extraocular Movements: Extraocular movements intact.     Conjunctiva/sclera: Conjunctivae normal.     Pupils: Pupils are equal, round, and reactive to light.  Cardiovascular:     Rate and Rhythm: Normal rate and regular rhythm.     Heart sounds: No murmur heard. Pulmonary:     Effort: Pulmonary effort is normal. No respiratory distress.     Breath sounds: Normal breath sounds. No wheezing or rales.  Musculoskeletal:     Cervical back: Normal range of motion and neck supple.  Skin:    General: Skin is warm and dry.     Capillary Refill: Capillary refill takes less than 2 seconds.  Neurological:     General: No focal deficit present.     Mental Status: She is alert and oriented to person, place, and time. Mental status is at baseline.  Psychiatric:        Mood and Affect: Mood normal.        Behavior: Behavior normal.        Thought Content: Thought content normal.        Judgment: Judgment normal.     Results for orders placed or performed in visit on 07/22/21  HM PAP SMEAR  Result Value Ref Range   HM Pap smear see result scanned into chart       Assessment & Plan:   Problem List Items Addressed This Visit       Cardiovascular and Mediastinum   Essential hypertension    Chronic.  Controlled.  Continue with current medication regimen of Telimisartan.  Refills sent today.  Labs ordered today.  Return to  clinic in 6 months for reevaluation.  Call sooner if concerns arise.        Relevant Medications   telmisartan-hydrochlorothiazide (MICARDIS HCT) 80-25 MG tablet   Other Relevant Orders   Comp Met (CMET)     Digestive   Biliary cirrhosis ([place]) - Primary    Chronic.  Controlled.  Continue with current medication regimen.  Labs ordered today.  Return to clinic in 6 months for reevaluation.  Call sooner if concerns arise.          Other   Depressive disorder    Chronic.  Controlled without medication.  Labs ordered today.  Return to clinic in 6 months for reevaluation.  Call sooner if concerns arise.        Hypercholesteremia    Chronic.  Controlled.  Continue with current medication regimen.  Labs ordered today.  Return to clinic in 6 months for reevaluation.  Call sooner if concerns arise.        Relevant Medications   telmisartan-hydrochlorothiazide (MICARDIS HCT) 80-25 MG tablet   Other Relevant Orders   Lipid Profile   Major depressive disorder, single episode    Chronic.  Controlled without medication.  Labs ordered today.  Return to clinic in 6 months for reevaluation.  Call sooner if concerns arise.       Prediabetes    Labs ordered at visit today.  Will make recommendations based on lab results.         Relevant Orders   HgB A1c     Follow up plan: Return in about 6 months (around 06/30/2022) for Physical and Fasting labs.

## 2021-12-28 NOTE — Assessment & Plan Note (Signed)
Labs ordered at visit today.  Will make recommendations based on lab results.   

## 2021-12-29 ENCOUNTER — Encounter: Payer: Self-pay | Admitting: Physical Therapy

## 2021-12-29 ENCOUNTER — Ambulatory Visit: Payer: BLUE CROSS/BLUE SHIELD | Admitting: Physical Therapy

## 2021-12-29 DIAGNOSIS — R262 Difficulty in walking, not elsewhere classified: Secondary | ICD-10-CM | POA: Diagnosis not present

## 2021-12-29 DIAGNOSIS — M25552 Pain in left hip: Secondary | ICD-10-CM | POA: Diagnosis not present

## 2021-12-29 DIAGNOSIS — M5459 Other low back pain: Secondary | ICD-10-CM | POA: Diagnosis not present

## 2021-12-29 DIAGNOSIS — M4726 Other spondylosis with radiculopathy, lumbar region: Secondary | ICD-10-CM | POA: Diagnosis not present

## 2021-12-29 LAB — COMPREHENSIVE METABOLIC PANEL
ALT: 45 IU/L — ABNORMAL HIGH (ref 0–32)
AST: 31 IU/L (ref 0–40)
Albumin/Globulin Ratio: 1.6 (ref 1.2–2.2)
Albumin: 4.5 g/dL (ref 3.9–4.9)
Alkaline Phosphatase: 192 IU/L — ABNORMAL HIGH (ref 44–121)
BUN/Creatinine Ratio: 14 (ref 12–28)
BUN: 11 mg/dL (ref 8–27)
Bilirubin Total: <0.2 mg/dL (ref 0.0–1.2)
CO2: 22 mmol/L (ref 20–29)
Calcium: 9.4 mg/dL (ref 8.7–10.3)
Chloride: 98 mmol/L (ref 96–106)
Creatinine, Ser: 0.78 mg/dL (ref 0.57–1.00)
Globulin, Total: 2.9 g/dL (ref 1.5–4.5)
Glucose: 116 mg/dL — ABNORMAL HIGH (ref 70–99)
Potassium: 4.1 mmol/L (ref 3.5–5.2)
Sodium: 138 mmol/L (ref 134–144)
Total Protein: 7.4 g/dL (ref 6.0–8.5)
eGFR: 84 mL/min/{1.73_m2} (ref >59)

## 2021-12-29 LAB — LIPID PANEL
Chol/HDL Ratio: 3.1 ratio (ref 0.0–4.4)
Cholesterol, Total: 165 mg/dL (ref 100–199)
HDL: 54 mg/dL (ref >39)
LDL Chol Calc (NIH): 79 mg/dL (ref 0–99)
Triglycerides: 190 mg/dL — ABNORMAL HIGH (ref 0–149)
VLDL Cholesterol Cal: 32 mg/dL (ref 5–40)

## 2021-12-29 LAB — HEMOGLOBIN A1C
Est. average glucose Bld gHb Est-mCnc: 128 mg/dL
Hgb A1c MFr Bld: 6.1 % — ABNORMAL HIGH (ref 4.8–5.6)

## 2021-12-29 NOTE — Progress Notes (Signed)
Hi Michaela Hardy. It was nice to see you yesterday.  Your lab work looks good.  No concerns at this time. Continue with your current medication regimen.  Follow up as discussed.  Please let me know if you have any questions.

## 2021-12-29 NOTE — Therapy (Signed)
OUTPATIENT PHYSICAL THERAPY TREATMENT NOTE   Patient Name: Michaela Hardy MRN: [id number] DOB:1955/11/28, 66 y.o., female Today's Date: 12/29/2021  PCP: Jon Billings NP  REFERRING PROVIDER: Dr. Park Liter   END OF SESSION:   PT End of Session - 12/29/21 1108     Visit Number 3    Number of Visits 16    Date for PT Re-Evaluation 02/17/22    Authorization Type [employer]    Authorization Time Period 12/22/21-02/17/22    Authorization - Visit Number 3    Authorization - Number of Visits 120    Progress Note Due on Visit 10    PT Start Time 1105    PT Stop Time 3154    PT Time Calculation (min) 40 min    Activity Tolerance Patient tolerated treatment well    Behavior During Therapy WFL for tasks assessed/performed             Past Medical History:  Diagnosis Date   Anxiety    Depression    GERD (gastroesophageal reflux disease)    Hepatitis    Primary bilary cholangitis   Hyperlipidemia    Hypertension    PONV (postoperative nausea and vomiting)    Past Surgical History:  Procedure Laterality Date   APPENDECTOMY  1976   BREAST BIOPSY Left 10/03/2014   Dr. Autumn Patty   BREAST EXCISIONAL BIOPSY Left    BREAST LUMPECTOMY WITH RADIOACTIVE SEED LOCALIZATION Left 11/21/2014   Procedure: LEFT BREAST LUMPECTOMY WITH RADIOACTIVE SEED LOCALIZATION;  Surgeon: Autumn Messing III, MD;  Location: [facility];  Service: General;  Laterality: Left;   CERVICAL BIOPSY  W/ LOOP ELECTRODE EXCISION     CHOLECYSTECTOMY  06/29/2007   LIVER BIOPSY  2008   TUBAL LIGATION  1987   Patient Active Problem List   Diagnosis Date Noted   Prediabetes 11/26/2020   Disease of liver 05/25/2019   Biliary cirrhosis ([place]) 05/25/2019   Elevated blood sugar 10/20/2015   Allergic sinusitis 01/14/2015   Eustachian tube dysfunction 01/14/2015   Primary biliary cholangitis ([place]) 11/15/2014   Other migraine, not intractable, without status migrainosus 08/10/2014   Depressive disorder 08/10/2014    Acid reflux 08/10/2014   Other specified bacterial intestinal infections 08/10/2014   Hypercholesteremia 08/10/2014   Essential hypertension 08/10/2014   Insomnia 08/10/2014   Gastro-esophageal reflux disease without esophagitis 08/10/2014   Major depressive disorder, single episode 08/10/2014    REFERRING DIAG: M47.26 (ICD-10-CM) - Osteoarthritis of spine with radiculopathy, lumbar region  THERAPY DIAG:  Other low back pain  Pain in left hip  Difficulty in walking, not elsewhere classified  Rationale for Evaluation and Treatment Rehabilitation  PERTINENT HISTORY: Last August pt sustained a left leg injury doing water aerobics. She had lingering pain that resolved after completing a bout of PT, but it has since returned along with low back pain to point where she needs to use AD when ambulating over uneven terrain.  PRECAUTIONS: None   SUBJECTIVE: Pt states that her hip feeling has been doing well overall, but she was feeling increased pain after last session. She thinks it might have been the standing hip abduction exercise.   PAIN:  Are you having pain? No   OBJECTIVE: (objective measures completed at initial evaluation unless otherwise dated)  VITALS: BP 160/70 HR 90 SpO2 99   DIAGNOSTIC FINDINGS:  CLINICAL DATA:  Low back pain   EXAM: LUMBAR SPINE - COMPLETE 4+ VIEW   COMPARISON:  None Available.   FINDINGS: Lumbar  vertebral body height are preserved without evidence of fracture. Grade 1 anterolisthesis of L4 on L5. No spondylolysis identified. Intervertebral disc spaces are preserved. Mild facet arthropathy. Calcified plaques in the abdominal aorta.   IMPRESSION: Degenerative changes of the lumbar spine as described.     PATIENT SURVEYS:  FOTO 45/100 with target of 53     SCREENING FOR RED FLAGS: Bowel or bladder incontinence: No Spinal tumors: No Cauda equina syndrome: No Compression fracture: No Abdominal aneurysm: No   COGNITION:            Overall cognitive status: Within functional limits for tasks assessed                          SENSATION: WFL   MUSCLE LENGTH: Hamstrings: Right 90 deg; Left 90 deg Thomas test: Negative bilateral   POSTURE: No Significant postural limitations   PALPATION:Left glute med and bilateral low back.        LUMBAR ROM:    Active  A/PROM  eval  Flexion 100  Extension 100  Right lateral flexion 100  Left lateral flexion 100  Right rotation 100  Left rotation 100   (Blank rows = not tested)   LOWER EXTREMITY ROM:          Active  Right 12/22/2021 Left 12/22/2021  Hip flexion 120 120  Hip extension 30 30  Hip abduction 45 45  Hip adduction 30 30  Hip internal rotation 45 45  Hip external rotation 45 25*  Knee flexion 135 135  Knee extension 0 0   (Blank rows = not tested)        LOWER EXTREMITY MMT:     MMT Right eval Left eval  Hip flexion 5 5  Hip extension 4- 4-  Hip abduction 4 4  Hip adduction 4 4  Hip internal rotation  4+ 4+   Hip external rotation 4+  4+   Knee flexion 5 5  Knee extension 5 5  Ankle dorsiflexion 5 5  Ankle plantarflexion      Ankle inversion      Ankle eversion       (Blank rows = not tested)   LUMBAR SPECIAL TESTS:  Straight leg raise test: Negative, FABER test: Negative, Thomas test: Negative, and FADIR Negative    FUNCTIONAL TESTS:  None performed    GAIT: Distance walked: 40 ft  Assistive device utilized: None Level of assistance: Complete Independence Comments: No gait abnormalities noted        TODAY'S TREATMENT   12/29/21:  Nu-Step seat and arm level 9 with resistance 1 for 5 min   Side Lying Hip Adduction 3 x 10   Side Lying Hip Abduction 1 x 10 -Use red theraband for right leg    Mini-Squats with BUE support 3 x 10   Stand Heel Raises 1 x 10  Standing Heel Raises 1 x 10 with #8 water jug  Standing Heel Raises 1 x 10 with 2 x #8 water jug  Single leg heel raises on LLE 2 x 10    12/24/21:   Nu-Step  Seat and arm level 9 with 1 resistance for 5 min  35mT: 1,050 ft  Hip ER R/L 4+/4+ Hip IR R/L 4+/4+  Standing Hip Abduction 2 x 10  -Pt reports increased pain in her left hip   Side Lying Hip Abduction 3 x 10 on LLE SLR with LLE 1 x 10              -  Pt reports increased pain in left groin area             Standing marches with BUE on LLE 1 x 10             Standing marches with BUE on LLE with #3 AW 1 x 10                  Initial  Quad prone stretch 2 x 30 sec  Seated ER stretch with use of strap 4 x 30 sec      PATIENT EDUCATION:  Education details: form and technique for appropriate exercise. Explanation about plan of care   Person educated: Patient Education method: Explanation, Demonstration, Verbal cues, and Handouts Education comprehension: verbalized understanding, returned demonstration, verbal cues required, and tactile cues required     HOME EXERCISE PROGRAM: Access Code: [email or web address] URL: [email or web address] Date: 12/29/2021 Prepared by: Bradly Chris  Exercises - Prone Quadriceps Stretch with Strap  - 1 x daily - 7 x weekly - 3 reps - 30 hold - Seated Hip External Rotation Stretch  - 1 x daily - 7 x weekly - 3 reps - 30 hold - Sidelying Hip Abduction  - 1 x daily - 3 x weekly - 3 sets - 10 reps - Sidelying Hip Adduction  - 1 x daily - 3 x weekly - 3 sets - 10 reps - Mini Squat  - 1 x daily - 3 x weekly - 3 sets - 10 reps - Standing Single Leg Heel Raise  - 1 x daily - 3 x weekly - 3 sets - 10 reps   ASSESSMENT:   CLINICAL IMPRESSION: Patient was able to perform all exercises without an increase in her left hip pain. Exercises progressed to medium level of difficulty to avoid overexertion and increased pain response. She will continue to benefit from skilled PT to improve hip mobility and strength to help resolve low back and hip pain to return to walking and playing recreational sports without needing to limit activity due to pain.    OBJECTIVE  IMPAIRMENTS decreased endurance, difficulty walking, decreased ROM, decreased strength, obesity, and pain.    ACTIVITY LIMITATIONS bending, sitting, standing, squatting, sleeping, and locomotion level   PARTICIPATION LIMITATIONS: cleaning, laundry, driving, shopping, community activity, yard work, and playing recreational sports    [place] Age, Past/current experiences, and Time since onset of injury/illness/exacerbation are also affecting patient's functional outcome.    REHAB POTENTIAL: Fair chronic condition that did not fully resolve with first bout of PT   CLINICAL DECISION MAKING: Stable/uncomplicated   EVALUATION COMPLEXITY: Low     GOALS: Goals reviewed with patient? No   SHORT TERM GOALS: Target date: 01/06/2022   Pt will be independent with HEP in order to improve strength and balance in order to decrease fall risk and improve function at home and work. Baseline: NT  Goal status: Ongoing    2. Pt will increase 6MWT by at least 14m(1672f in order to demonstrate clinically significant improvement in cardiopulmonary endurance and community ambulation. Baseline: 1,050 ft  Goal status: Ongoing        LONG TERM GOALS: Target date: 02/17/2022   Patient will have improved function and activity level as evidenced by an increase in FOTO score by 10 points or more.  Baseline: 45/100 with target of 61  Goal status: Ongoing    2.  Patient will improve left hip ROM to be symmetrical to right for improved biomechanics and joint loading  during walking and transfers to decrease pain and return to recreational activities.  Baseline: Hip ER R/L 45/25  Goal status: Ongoing    3.  Patient will improve hip strength by 1/2 MMT to better support spinal structures to offload mechanical forces and to improve pain response to activity.  Baseline: Hip Abduction R/L 4/4, Hip Adduction R/L 4/4, Hip Ext R/L 4-/4-  Goal status: Ongoing    4. Patient will ambulate >= 1,000 ft without  needing a standing rest break due to pain during 50mT to show decreased pain in low back and left hip resulting in improved aerobic endurance.  Baseline: 1,050 ft  Goal status: Achieved      PLAN: PT FREQUENCY: 1-2x/week   PT DURATION: 8 weeks   PLANNED INTERVENTIONS: Therapeutic exercises, Neuromuscular re-education, Balance training, Gait training, Patient/Family education, Self Care, Joint mobilization, Aquatic Therapy, Dry Needling, Electrical stimulation, Spinal manipulation, Spinal mobilization, Cryotherapy, Moist heat, Manual therapy, and Re-evaluation.   PLAN FOR NEXT SESSION: Progress hip mobility and strength exercises      DBradly ChrisPT, DPT  12/29/2021, 11:09 AM

## 2021-12-31 ENCOUNTER — Ambulatory Visit: Payer: BLUE CROSS/BLUE SHIELD | Admitting: Physical Therapy

## 2021-12-31 ENCOUNTER — Encounter: Payer: Self-pay | Admitting: Physical Therapy

## 2021-12-31 DIAGNOSIS — M25552 Pain in left hip: Secondary | ICD-10-CM

## 2021-12-31 DIAGNOSIS — M5459 Other low back pain: Secondary | ICD-10-CM

## 2021-12-31 DIAGNOSIS — M4726 Other spondylosis with radiculopathy, lumbar region: Secondary | ICD-10-CM | POA: Diagnosis not present

## 2021-12-31 DIAGNOSIS — R262 Difficulty in walking, not elsewhere classified: Secondary | ICD-10-CM

## 2021-12-31 NOTE — Therapy (Signed)
OUTPATIENT PHYSICAL THERAPY TREATMENT NOTE   Patient Name: Michaela Hardy MRN: [id number] DOB:[birth year], 66 y.o., female Today's Date: 12/31/2021  PCP: Jon Billings NP  REFERRING PROVIDER: Dr. Park Liter   END OF SESSION:   PT End of Session - 12/31/21 0938     Visit Number 4    Number of Visits 16    Date for PT Re-Evaluation 02/17/22    Authorization Type BCBS    Authorization Time Period 12/22/21-02/17/22    Authorization - Visit Number 4    Authorization - Number of Visits 120    Progress Note Due on Visit 10    PT Start Time 0935    PT Stop Time 7619    PT Time Calculation (min) 40 min    Activity Tolerance Patient tolerated treatment well    Behavior During Therapy WFL for tasks assessed/performed             Past Medical History:  Diagnosis Date   Anxiety    Depression    GERD (gastroesophageal reflux disease)    Hepatitis    Primary bilary cholangitis   Hyperlipidemia    Hypertension    PONV (postoperative nausea and vomiting)    Past Surgical History:  Procedure Laterality Date   APPENDECTOMY  1976   BREAST BIOPSY Left 10/03/2014   Dr. Autumn Patty   BREAST EXCISIONAL BIOPSY Left    BREAST LUMPECTOMY WITH RADIOACTIVE SEED LOCALIZATION Left 11/21/2014   Procedure: LEFT BREAST LUMPECTOMY WITH RADIOACTIVE SEED LOCALIZATION;  Surgeon: Autumn Messing III, MD;  Location: [facility];  Service: General;  Laterality: Left;   CERVICAL BIOPSY  W/ LOOP ELECTRODE EXCISION     CHOLECYSTECTOMY  06/29/2007   LIVER BIOPSY  2008   TUBAL LIGATION  1987   Patient Active Problem List   Diagnosis Date Noted   Prediabetes 11/26/2020   Disease of liver 05/25/2019   Biliary cirrhosis ([place]) 05/25/2019   Elevated blood sugar 10/20/2015   Allergic sinusitis 01/14/2015   Eustachian tube dysfunction 01/14/2015   Primary biliary cholangitis ([place]) 11/15/2014   Other migraine, not intractable, without status migrainosus 08/10/2014   Depressive disorder 08/10/2014    Acid reflux 08/10/2014   Other specified bacterial intestinal infections 08/10/2014   Hypercholesteremia 08/10/2014   Essential hypertension 08/10/2014   Insomnia 08/10/2014   Gastro-esophageal reflux disease without esophagitis 08/10/2014   Major depressive disorder, single episode 08/10/2014    REFERRING DIAG: M47.26 (ICD-10-CM) - Osteoarthritis of spine with radiculopathy, lumbar region  THERAPY DIAG:  Other low back pain  Pain in left hip  Difficulty in walking, not elsewhere classified  Rationale for Evaluation and Treatment Rehabilitation  PERTINENT HISTORY: Last August pt sustained a left leg injury doing water aerobics. She had lingering pain that resolved after completing a bout of PT, but it has since returned along with low back pain to point where she needs to use AD when ambulating over uneven terrain.  PRECAUTIONS: None   SUBJECTIVE: Pt reports increased pain from doing seated ER stretch. She is able to get the left leg ontop of the knee but pushing it causes increased pain.  PAIN:  Are you having pain? No   OBJECTIVE: (objective measures completed at initial evaluation unless otherwise dated)  VITALS: BP 160/70 HR 90 SpO2 99   DIAGNOSTIC FINDINGS:  CLINICAL DATA:  Low back pain   EXAM: LUMBAR SPINE - COMPLETE 4+ VIEW   COMPARISON:  None Available.   FINDINGS: Lumbar vertebral body height are preserved  without evidence of fracture. Grade 1 anterolisthesis of L4 on L5. No spondylolysis identified. Intervertebral disc spaces are preserved. Mild facet arthropathy. Calcified plaques in the abdominal aorta.   IMPRESSION: Degenerative changes of the lumbar spine as described.     PATIENT SURVEYS:  FOTO 45/100 with target of 55     SCREENING FOR RED FLAGS: Bowel or bladder incontinence: No Spinal tumors: No Cauda equina syndrome: No Compression fracture: No Abdominal aneurysm: No   COGNITION:           Overall cognitive status: Within functional  limits for tasks assessed                          SENSATION: WFL   MUSCLE LENGTH: Hamstrings: Right 90 deg; Left 90 deg Thomas test: Negative bilateral   POSTURE: No Significant postural limitations   PALPATION:Left glute med and bilateral low back.        LUMBAR ROM:    Active  A/PROM  eval  Flexion 100  Extension 100  Right lateral flexion 100  Left lateral flexion 100  Right rotation 100  Left rotation 100   (Blank rows = not tested)   LOWER EXTREMITY ROM:          Active  Right 12/22/2021 Left 12/22/2021  Hip flexion 120 120  Hip extension 30 30  Hip abduction 45 45  Hip adduction 30 30  Hip internal rotation 45 45  Hip external rotation 45 25*  Knee flexion 135 135  Knee extension 0 0   (Blank rows = not tested)        LOWER EXTREMITY MMT:     MMT Right eval Left eval  Hip flexion 5 5  Hip extension 4- 4-  Hip abduction 4 4  Hip adduction 4 4  Hip internal rotation  4+ 4+   Hip external rotation 4+  4+   Knee flexion 5 5  Knee extension 5 5  Ankle dorsiflexion 5 5  Ankle plantarflexion      Ankle inversion      Ankle eversion       (Blank rows = not tested)   LUMBAR SPECIAL TESTS:  Straight leg raise test: Negative, FABER test: Negative, Thomas test: Negative, and FADIR Negative    FUNCTIONAL TESTS:  None performed    GAIT: Distance walked: 40 ft  Assistive device utilized: None Level of assistance: Complete Independence Comments: No gait abnormalities noted        TODAY'S TREATMENT   12/31/21:  Nu-Step seat and arm level with resistance 1 for 7 min   Standing Hip Adductor Stretch 3 x 30 sec   Seated Hip ER stretch on LLE 3 x 30 sec  -Pt did not experience increased pain when not placing overpressure over left knee   Mini-Squat into butt taps on 20 inch mat surface with #8 jug of water  3 x 10   SLS on LLE 2 x 10 sec with intermittent use of UE  -Pt unable to maintain balance   Tandem Stance with LLE in back postion 5  x 10 sec holds  12/29/21:  Nu-Step seat and arm level 9 with resistance 1 for 5 min   Side Lying Hip Adduction 3 x 10   Side Lying Hip Abduction 1 x 10 -Use red theraband for right leg    Mini-Squats with BUE support 3 x 10   Stand Heel Raises 1 x 10  Standing Heel  Raises 1 x 10 with #8 water jug  Standing Heel Raises 1 x 10 with 2 x #8 water jug  Single leg heel raises on LLE 2 x 10    12/24/21:   Nu-Step Seat and arm level 9 with 1 resistance for 5 min  63mT: 1,050 ft  Hip ER R/L 4+/4+ Hip IR R/L 4+/4+  Standing Hip Abduction 2 x 10  -Pt reports increased pain in her left hip   Side Lying Hip Abduction 3 x 10 on LLE SLR with LLE 1 x 10              -Pt reports increased pain in left groin area             Standing marches with BUE on LLE 1 x 10             Standing marches with BUE on LLE with #3 AW 1 x 10                  Initial  Quad prone stretch 2 x 30 sec  Seated ER stretch with use of strap 4 x 30 sec      PATIENT EDUCATION:  Education details: form and technique for appropriate exercise. Explanation about plan of care   Person educated: Patient Education method: Explanation, Demonstration, Verbal cues, and Handouts Education comprehension: verbalized understanding, returned demonstration, verbal cues required, and tactile cues required     HOME EXERCISE PROGRAM: Access Code: [email or web address]: [email or web address] Date: 12/31/2021 Prepared by: DBradly Chris Exercises - Prone Quadriceps Stretch with Strap  - 1 x daily - 7 x weekly - 3 reps - 30 hold - Seated Hip External Rotation Stretch  - 1 x daily - 7 x weekly - 3 reps - 30 hold - Sidelying Hip Abduction  - 1 x daily - 3 x weekly - 3 sets - 10 reps - Sidelying Hip Adduction  - 1 x daily - 3 x weekly - 3 sets - 10 reps - Mini Squat  - 1 x daily - 3 x weekly - 3 sets - 10 reps - Standing Single Leg Heel Raise  - 1 x daily - 3 x weekly - 3 sets - 10 reps - Side Lunge Adductor Stretch  - 1  x daily - 3 x weekly - 3 reps - 30 hold - Tandem Stance  - 1 x daily - 7 x weekly - 5 reps - 10 hold   ASSESSMENT:   CLINICAL IMPRESSION:  Despite initial report of left hip pain after performing left hip ER stretch, pt was able to perform all exercises during today's session without an increase in her left hip pain. She shows an improvement in her LE strength with ability to perform mini-squats with increased weight. Hip ER stretch did not increase pain when not place overpressure over left knee. PT advised pt to perform the stretch the same way at home. She will continue to benefit from skilled PT to improve hip mobility and strength to help resolve low back and hip pain to return to walking and playing recreational sports without needing to limit activity due to pain.    OBJECTIVE IMPAIRMENTS decreased endurance, difficulty walking, decreased ROM, decreased strength, obesity, and pain.    ACTIVITY LIMITATIONS bending, sitting, standing, squatting, sleeping, and locomotion level   PARTICIPATION LIMITATIONS: cleaning, laundry, driving, shopping, community activity, yard work, and playing recreational sports    [place], Past/current experiences, and Time since onset  of injury/illness/exacerbation are also affecting patient's functional outcome.    REHAB POTENTIAL: Fair chronic condition that did not fully resolve with first bout of PT   CLINICAL DECISION MAKING: Stable/uncomplicated   EVALUATION COMPLEXITY: Low     GOALS: Goals reviewed with patient? No   SHORT TERM GOALS: Target date: 01/06/2022   Pt will be independent with HEP in order to improve strength and balance in order to decrease fall risk and improve function at home and work. Baseline: NT  Goal status: Ongoing    2. Pt will increase 6MWT by at least 63m(1648f in order to demonstrate clinically significant improvement in cardiopulmonary endurance and community ambulation. Baseline: 1,050 ft  Goal status:  Ongoing        LONG TERM GOALS: Target date: 02/17/2022   Patient will have improved function and activity level as evidenced by an increase in FOTO score by 10 points or more.  Baseline: 45/100 with target of 61  Goal status: Ongoing    2.  Patient will improve left hip ROM to be symmetrical to right for improved biomechanics and joint loading during walking and transfers to decrease pain and return to recreational activities.  Baseline: Hip ER R/L 45/25  Goal status: Ongoing    3.  Patient will improve hip strength by 1/2 MMT to better support spinal structures to offload mechanical forces and to improve pain response to activity.  Baseline: Hip Abduction R/L 4/4, Hip Adduction R/L 4/4, Hip Ext R/L 4-/4-  Goal status: Ongoing    4. Patient will ambulate >= 1,000 ft without needing a standing rest break due to pain during 43m53mto show decreased pain in low back and left hip resulting in improved aerobic endurance.  Baseline: 1,050 ft  Goal status: Achieved      PLAN: PT FREQUENCY: 1-2x/week   PT DURATION: 8 weeks   PLANNED INTERVENTIONS: Therapeutic exercises, Neuromuscular re-education, Balance training, Gait training, Patient/Family education, Self Care, Joint mobilization, Aquatic Therapy, Dry Needling, Electrical stimulation, Spinal manipulation, Spinal mobilization, Cryotherapy, Moist heat, Manual therapy, and Re-evaluation.   PLAN FOR NEXT SESSION: Progress hip mobility and strength exercises      DanBradly Chris, DPT  12/31/2021, 9:39 AM

## 2022-01-05 ENCOUNTER — Ambulatory Visit: Payer: BLUE CROSS/BLUE SHIELD | Admitting: Physical Therapy

## 2022-01-05 ENCOUNTER — Encounter: Payer: Self-pay | Admitting: Physical Therapy

## 2022-01-05 DIAGNOSIS — M4726 Other spondylosis with radiculopathy, lumbar region: Secondary | ICD-10-CM | POA: Diagnosis not present

## 2022-01-05 DIAGNOSIS — R262 Difficulty in walking, not elsewhere classified: Secondary | ICD-10-CM | POA: Diagnosis not present

## 2022-01-05 DIAGNOSIS — M25552 Pain in left hip: Secondary | ICD-10-CM | POA: Diagnosis not present

## 2022-01-05 DIAGNOSIS — M5459 Other low back pain: Secondary | ICD-10-CM | POA: Diagnosis not present

## 2022-01-05 NOTE — Therapy (Signed)
OUTPATIENT PHYSICAL THERAPY TREATMENT NOTE   Patient Name: Michaela Hardy MRN: [id number] DOB:08-19-1955, 66 y.o., female Today's Date: 01/05/2022  PCP: Jon Billings NP  REFERRING PROVIDER: Dr. Park Liter   END OF SESSION:   PT End of Session - 01/05/22 1556     Visit Number 5    Number of Visits 16    Date for PT Re-Evaluation 02/17/22    Authorization Type BCBS    Authorization Time Period 12/22/21-02/17/22    Authorization - Visit Number 5    Authorization - Number of Visits 120    Progress Note Due on Visit 10    PT Start Time 8786    PT Stop Time 1630    PT Time Calculation (min) 40 min    Activity Tolerance Patient tolerated treatment well    Behavior During Therapy WFL for tasks assessed/performed             Past Medical History:  Diagnosis Date   Anxiety    Depression    GERD (gastroesophageal reflux disease)    Hepatitis    Primary bilary cholangitis   Hyperlipidemia    Hypertension    PONV (postoperative nausea and vomiting)    Past Surgical History:  Procedure Laterality Date   APPENDECTOMY  1976   BREAST BIOPSY Left 10/03/2014   Dr. Autumn Patty   BREAST EXCISIONAL BIOPSY Left    BREAST LUMPECTOMY WITH RADIOACTIVE SEED LOCALIZATION Left 11/21/2014   Procedure: LEFT BREAST LUMPECTOMY WITH RADIOACTIVE SEED LOCALIZATION;  Surgeon: Autumn Messing III, MD;  Location: [facility];  Service: General;  Laterality: Left;   CERVICAL BIOPSY  W/ LOOP ELECTRODE EXCISION     CHOLECYSTECTOMY  06/29/2007   LIVER BIOPSY  2008   TUBAL LIGATION  1987   Patient Active Problem List   Diagnosis Date Noted   Prediabetes 11/26/2020   Disease of liver 05/25/2019   Biliary cirrhosis ([place]) 05/25/2019   Elevated blood sugar 10/20/2015   Allergic sinusitis 01/14/2015   Eustachian tube dysfunction 01/14/2015   Primary biliary cholangitis ([place]) 11/15/2014   Other migraine, not intractable, without status migrainosus 08/10/2014   Depressive disorder 08/10/2014    Acid reflux 08/10/2014   Other specified bacterial intestinal infections 08/10/2014   Hypercholesteremia 08/10/2014   Essential hypertension 08/10/2014   Insomnia 08/10/2014   Gastro-esophageal reflux disease without esophagitis 08/10/2014   Major depressive disorder, single episode 08/10/2014    REFERRING DIAG: M47.26 (ICD-10-CM) - Osteoarthritis of spine with radiculopathy, lumbar region  THERAPY DIAG:  Other low back pain  Pain in left hip  Difficulty in walking, not elsewhere classified  Rationale for Evaluation and Treatment Rehabilitation  PERTINENT HISTORY: Last August pt sustained a left leg injury doing water aerobics. She had lingering pain that resolved after completing a bout of PT, but it has since returned along with low back pain to point where she needs to use AD when ambulating over uneven terrain.  PRECAUTIONS: None   SUBJECTIVE: Pt reports increased LE muscle soreness because of increased activity taking care of her children.   PAIN:  Are you having pain? No   OBJECTIVE: (objective measures completed at initial evaluation unless otherwise dated)  VITALS: BP 160/70 HR 90 SpO2 99   DIAGNOSTIC FINDINGS:  CLINICAL DATA:  Low back pain   EXAM: LUMBAR SPINE - COMPLETE 4+ VIEW   COMPARISON:  None Available.   FINDINGS: Lumbar vertebral body height are preserved without evidence of fracture. Grade 1 anterolisthesis of L4 on L5.  No spondylolysis identified. Intervertebral disc spaces are preserved. Mild facet arthropathy. Calcified plaques in the abdominal aorta.   IMPRESSION: Degenerative changes of the lumbar spine as described.     PATIENT SURVEYS:  FOTO 45/100 with target of 80     SCREENING FOR RED FLAGS: Bowel or bladder incontinence: No Spinal tumors: No Cauda equina syndrome: No Compression fracture: No Abdominal aneurysm: No   COGNITION:           Overall cognitive status: Within functional limits for tasks assessed                           SENSATION: WFL   MUSCLE LENGTH: Hamstrings: Right 90 deg; Left 90 deg Thomas test: Negative bilateral   POSTURE: No Significant postural limitations   PALPATION:Left glute med and bilateral low back.        LUMBAR ROM:    Active  A/PROM  eval  Flexion 100  Extension 100  Right lateral flexion 100  Left lateral flexion 100  Right rotation 100  Left rotation 100   (Blank rows = not tested)   LOWER EXTREMITY ROM:          Active  Right 12/22/2021 Left 12/22/2021  Hip flexion 120 120  Hip extension 30 30  Hip abduction 45 45  Hip adduction 30 30  Hip internal rotation 45 45  Hip external rotation 45 25*  Knee flexion 135 135  Knee extension 0 0   (Blank rows = not tested)        LOWER EXTREMITY MMT:     MMT Right eval Left eval  Hip flexion 5 5  Hip extension 4- 4-  Hip abduction 4 4  Hip adduction 4 4  Hip internal rotation  4+ 4+   Hip external rotation 4+  4+   Knee flexion 5 5  Knee extension 5 5  Ankle dorsiflexion 5 5  Ankle plantarflexion      Ankle inversion      Ankle eversion       (Blank rows = not tested)   LUMBAR SPECIAL TESTS:  Straight leg raise test: Negative, FABER test: Negative, Thomas test: Negative, and FADIR Negative    FUNCTIONAL TESTS:  None performed    GAIT: Distance walked: 40 ft  Assistive device utilized: None Level of assistance: Complete Independence Comments: No gait abnormalities noted        TODAY'S TREATMENT   01/05/22:              Nu-Step seat and arm level with resistance 1 for 7 min             Hip Adductor to Left Side 3 x 30 sec             HS Stretch R+ L 6 x 30 sec             Calf Stretch R+ L 6 x 30 sec    12/31/21:  Nu-Step seat and arm level with resistance 1 for 7 min   Standing Hip Adductor Stretch 3 x 30 sec   Seated Hip ER stretch on LLE 3 x 30 sec  -Pt did not experience increased pain when not placing overpressure over left knee   Mini-Squat into butt taps on 20 inch  mat surface with #8 jug of water  3 x 10   SLS on LLE 2 x 10 sec with intermittent use of UE  -Pt  unable to maintain balance   Tandem Stance with LLE in back postion 5 x 10 sec holds  12/29/21:  Nu-Step seat and arm level 9 with resistance 1 for 5 min   Side Lying Hip Adduction 3 x 10   Side Lying Hip Abduction 1 x 10 -Use red theraband for right leg    Mini-Squats with BUE support 3 x 10   Stand Heel Raises 1 x 10  Standing Heel Raises 1 x 10 with #8 water jug  Standing Heel Raises 1 x 10 with 2 x #8 water jug  Single leg heel raises on LLE 2 x 10    12/24/21:   Nu-Step Seat and arm level 9 with 1 resistance for 5 min  49mT: 1,050 ft  Hip ER R/L 4+/4+ Hip IR R/L 4+/4+  Standing Hip Abduction 2 x 10  -Pt reports increased pain in her left hip   Side Lying Hip Abduction 3 x 10 on LLE SLR with LLE 1 x 10              -Pt reports increased pain in left groin area             Standing marches with BUE on LLE 1 x 10             Standing marches with BUE on LLE with #3 AW 1 x 10     PATIENT EDUCATION:  Education details: form and technique for appropriate exercise. Explanation about plan of care   Person educated: Patient Education method: Explanation, Demonstration, Verbal cues, and Handouts Education comprehension: verbalized understanding, returned demonstration, verbal cues required, and tactile cues required     HOME EXERCISE PROGRAM: Access Code: [email or web address]: [email or web address] Date: 01/05/2022 Prepared by: DBradly Chris Exercises - Prone Quadriceps Stretch with Strap  - 1 x daily - 7 x weekly - 3 reps - 30 hold - Seated Hip External Rotation Stretch  - 1 x daily - 7 x weekly - 3 reps - 30 hold - Sidelying Hip Abduction  - 1 x daily - 3 x weekly - 3 sets - 10 reps - Sidelying Hip Adduction  - 1 x daily - 3 x weekly - 3 sets - 10 reps - Mini Squat  - 1 x daily - 3 x weekly - 3 sets - 10 reps - Standing Single Leg Heel Raise  - 1 x daily - 3  x weekly - 3 sets - 10 reps - Side Lunge Adductor Stretch  - 1 x daily - 3 x weekly - 3 reps - 30 hold - Tandem Stance  - 1 x daily - 7 x weekly - 5 reps - 10 hold - Seated Table Hamstring Stretch  - 1 x daily - 3 reps - 30 hold - Gastroc Stretch on Step  - 1 x daily - 3 reps - 30 hold   ASSESSMENT:   CLINICAL IMPRESSION:  Session focused around hip mobility, because of initial report of increased LE tension. Pt exhibits improvement in muscle tension as evidenced by subjective report of improvement in feeling stiff by end of session. She was able to perform all exercises without experiencing an increase in her pain. Session terminated early because pt needed to leave early to attend another apt. She will continue to benefit from skilled PT to improve hip mobility and strength to help resolve low back and hip pain to return to walking and playing recreational sports without needing to limit activity due to  pain.    OBJECTIVE IMPAIRMENTS decreased endurance, difficulty walking, decreased ROM, decreased strength, obesity, and pain.    ACTIVITY LIMITATIONS bending, sitting, standing, squatting, sleeping, and locomotion level   PARTICIPATION LIMITATIONS: cleaning, laundry, driving, shopping, community activity, yard work, and playing recreational sports    [place] Age, Past/current experiences, and Time since onset of injury/illness/exacerbation are also affecting patient's functional outcome.    REHAB POTENTIAL: Fair chronic condition that did not fully resolve with first bout of PT   CLINICAL DECISION MAKING: Stable/uncomplicated   EVALUATION COMPLEXITY: Low     GOALS: Goals reviewed with patient? No   SHORT TERM GOALS: Target date: 01/06/2022   Pt will be independent with HEP in order to improve strength and balance in order to decrease fall risk and improve function at home and work. Baseline: NT  Goal status: Ongoing    2. Pt will increase 6MWT by at least 21m(1656f in  order to demonstrate clinically significant improvement in cardiopulmonary endurance and community ambulation. Baseline: 1,050 ft  Goal status: Ongoing        LONG TERM GOALS: Target date: 02/17/2022   Patient will have improved function and activity level as evidenced by an increase in FOTO score by 10 points or more.  Baseline: 45/100 with target of 61  Goal status: Ongoing    2.  Patient will improve left hip ROM to be symmetrical to right for improved biomechanics and joint loading during walking and transfers to decrease pain and return to recreational activities.  Baseline: Hip ER R/L 45/25  Goal status: Ongoing    3.  Patient will improve hip strength by 1/2 MMT to better support spinal structures to offload mechanical forces and to improve pain response to activity.  Baseline: Hip Abduction R/L 4/4, Hip Adduction R/L 4/4, Hip Ext R/L 4-/4-  Goal status: Ongoing    4. Patient will ambulate >= 1,000 ft without needing a standing rest break due to pain during 20m49mto show decreased pain in low back and left hip resulting in improved aerobic endurance.  Baseline: 1,050 ft  Goal status: Achieved      PLAN: PT FREQUENCY: 1-2x/week   PT DURATION: 8 weeks   PLANNED INTERVENTIONS: Therapeutic exercises, Neuromuscular re-education, Balance training, Gait training, Patient/Family education, Self Care, Joint mobilization, Aquatic Therapy, Dry Needling, Electrical stimulation, Spinal manipulation, Spinal mobilization, Cryotherapy, Moist heat, Manual therapy, and Re-evaluation.   PLAN FOR NEXT SESSION: Show pt progressions for HEP. Progress hip mobility and strength exercises.     DanBradly Chris, DPT  01/05/2022, 3:58 PM

## 2022-01-07 ENCOUNTER — Ambulatory Visit: Payer: BLUE CROSS/BLUE SHIELD | Admitting: Physical Therapy

## 2022-01-07 ENCOUNTER — Encounter: Payer: Self-pay | Admitting: Physical Therapy

## 2022-01-07 DIAGNOSIS — M5459 Other low back pain: Secondary | ICD-10-CM | POA: Diagnosis not present

## 2022-01-07 DIAGNOSIS — R262 Difficulty in walking, not elsewhere classified: Secondary | ICD-10-CM | POA: Diagnosis not present

## 2022-01-07 DIAGNOSIS — M25552 Pain in left hip: Secondary | ICD-10-CM | POA: Diagnosis not present

## 2022-01-07 DIAGNOSIS — M4726 Other spondylosis with radiculopathy, lumbar region: Secondary | ICD-10-CM | POA: Diagnosis not present

## 2022-01-07 NOTE — Therapy (Signed)
OUTPATIENT PHYSICAL THERAPY TREATMENT NOTE   Patient Name: Michaela Hardy MRN: [id number] DOB:[birth year], 66 y.o., female Today's Date: 01/07/2022  PCP: Jon Billings NP  REFERRING PROVIDER: Dr. Park Liter   END OF SESSION:   PT End of Session - 01/07/22 0937     Visit Number 6    Number of Visits 16    Date for PT Re-Evaluation 02/17/22    Authorization Type BCBS    Authorization Time Period 12/22/21-02/17/22    Authorization - Visit Number 5    Authorization - Number of Visits 120    Progress Note Due on Visit 10    PT Start Time 0935    PT Stop Time 2355    PT Time Calculation (min) 40 min    Activity Tolerance Patient tolerated treatment well    Behavior During Therapy WFL for tasks assessed/performed             Past Medical History:  Diagnosis Date   Anxiety    Depression    GERD (gastroesophageal reflux disease)    Hepatitis    Primary bilary cholangitis   Hyperlipidemia    Hypertension    PONV (postoperative nausea and vomiting)    Past Surgical History:  Procedure Laterality Date   APPENDECTOMY  1976   BREAST BIOPSY Left 10/03/2014   Dr. Autumn Patty   BREAST EXCISIONAL BIOPSY Left    BREAST LUMPECTOMY WITH RADIOACTIVE SEED LOCALIZATION Left 11/21/2014   Procedure: LEFT BREAST LUMPECTOMY WITH RADIOACTIVE SEED LOCALIZATION;  Surgeon: Autumn Messing III, MD;  Location: [facility];  Service: General;  Laterality: Left;   CERVICAL BIOPSY  W/ LOOP ELECTRODE EXCISION     CHOLECYSTECTOMY  06/29/2007   LIVER BIOPSY  2008   TUBAL LIGATION  1987   Patient Active Problem List   Diagnosis Date Noted   Prediabetes 11/26/2020   Disease of liver 05/25/2019   Biliary cirrhosis ([place]) 05/25/2019   Elevated blood sugar 10/20/2015   Allergic sinusitis 01/14/2015   Eustachian tube dysfunction 01/14/2015   Primary biliary cholangitis ([place]) 11/15/2014   Other migraine, not intractable, without status migrainosus 08/10/2014   Depressive disorder 08/10/2014    Acid reflux 08/10/2014   Other specified bacterial intestinal infections 08/10/2014   Hypercholesteremia 08/10/2014   Essential hypertension 08/10/2014   Insomnia 08/10/2014   Gastro-esophageal reflux disease without esophagitis 08/10/2014   Major depressive disorder, single episode 08/10/2014    REFERRING DIAG: M47.26 (ICD-10-CM) - Osteoarthritis of spine with radiculopathy, lumbar region  THERAPY DIAG:  Other low back pain  Pain in left hip  Difficulty in walking, not elsewhere classified  Rationale for Evaluation and Treatment Rehabilitation  PERTINENT HISTORY: Last August pt sustained a left leg injury doing water aerobics. She had lingering pain that resolved after completing a bout of PT, but it has since returned along with low back pain to point where she needs to use AD when ambulating over uneven terrain.  PRECAUTIONS: None   SUBJECTIVE: Pt's increased muscle soreness on lateral left side of hip that she describes more as muscle soreness.    PAIN:  Are you having pain? No   OBJECTIVE: (objective measures completed at initial evaluation unless otherwise dated)  VITALS: BP 160/70 HR 90 SpO2 99   DIAGNOSTIC FINDINGS:  CLINICAL DATA:  Low back pain   EXAM: LUMBAR SPINE - COMPLETE 4+ VIEW   COMPARISON:  None Available.   FINDINGS: Lumbar vertebral body height are preserved without evidence of fracture. Grade 1 anterolisthesis of  L4 on L5. No spondylolysis identified. Intervertebral disc spaces are preserved. Mild facet arthropathy. Calcified plaques in the abdominal aorta.   IMPRESSION: Degenerative changes of the lumbar spine as described.     PATIENT SURVEYS:  FOTO 45/100 with target of 64     SCREENING FOR RED FLAGS: Bowel or bladder incontinence: No Spinal tumors: No Cauda equina syndrome: No Compression fracture: No Abdominal aneurysm: No   COGNITION:           Overall cognitive status: Within functional limits for tasks assessed                           SENSATION: WFL   MUSCLE LENGTH: Hamstrings: Right 90 deg; Left 90 deg Thomas test: Negative bilateral   POSTURE: No Significant postural limitations   PALPATION:Left glute med and bilateral low back.        LUMBAR ROM:    Active  A/PROM  eval  Flexion 100  Extension 100  Right lateral flexion 100  Left lateral flexion 100  Right rotation 100  Left rotation 100   (Blank rows = not tested)   LOWER EXTREMITY ROM:          Active  Right 12/22/2021 Left 12/22/2021  Hip flexion 120 120  Hip extension 30 30  Hip abduction 45 45  Hip adduction 30 30  Hip internal rotation 45 45  Hip external rotation 45 25*  Knee flexion 135 135  Knee extension 0 0   (Blank rows = not tested)        LOWER EXTREMITY MMT:     MMT Right eval Left eval  Hip flexion 5 5  Hip extension 4- 4-  Hip abduction 4 4  Hip adduction 4 4  Hip internal rotation  4+ 4+   Hip external rotation 4+  4+   Knee flexion 5 5  Knee extension 5 5  Ankle dorsiflexion 5 5  Ankle plantarflexion      Ankle inversion      Ankle eversion       (Blank rows = not tested)   LUMBAR SPECIAL TESTS:  Straight leg raise test: Negative, FABER test: Negative, Thomas test: Negative, and FADIR Negative    FUNCTIONAL TESTS:  None performed    GAIT: Distance walked: 40 ft  Assistive device utilized: None Level of assistance: Complete Independence Comments: No gait abnormalities noted        TODAY'S TREATMENT   01/07/22 Nu-Step seat and arm level at 9 with resistance 1 for 6 min  Review of delayed onset muscle soreness using handout   Side Lying Hip Adduction on LLE 3 x 10            -Pt reports no pain and feels like she just needs to change beds from softer to firmer surface.             Mini-Squat with #8 jug of water held in elbow extension and shoulder flexed to 90 degrees  3 x 10   01/05/22:              Nu-Step seat and arm level with resistance 1 for 7 min             Hip  Adductor to Left Side 3 x 30 sec             HS Stretch R+ L 6 x 30 sec  Calf Stretch R+ L 6 x 30 sec    12/31/21:  Nu-Step seat and arm level with resistance 1 for 7 min   Standing Hip Adductor Stretch 3 x 30 sec   Seated Hip ER stretch on LLE 3 x 30 sec  -Pt did not experience increased pain when not placing overpressure over left knee   Mini-Squat into butt taps on 20 inch mat surface with #8 jug of water  3 x 10   SLS on LLE 2 x 10 sec with intermittent use of UE  -Pt unable to maintain balance   Tandem Stance with LLE in back postion 5 x 10 sec holds  12/29/21:  Nu-Step seat and arm level 9 with resistance 1 for 5 min   Side Lying Hip Adduction 3 x 10   Side Lying Hip Abduction 1 x 10 -Use red theraband for right leg    Mini-Squats with BUE support 3 x 10   Stand Heel Raises 1 x 10  Standing Heel Raises 1 x 10 with #8 water jug  Standing Heel Raises 1 x 10 with 2 x #8 water jug  Single leg heel raises on LLE 2 x 10    12/24/21:   Nu-Step Seat and arm level 9 with 1 resistance for 5 min  17mT: 1,050 ft  Hip ER R/L 4+/4+ Hip IR R/L 4+/4+  Standing Hip Abduction 2 x 10  -Pt reports increased pain in her left hip   Side Lying Hip Abduction 3 x 10 on LLE SLR with LLE 1 x 10              -Pt reports increased pain in left groin area             Standing marches with BUE on LLE 1 x 10             Standing marches with BUE on LLE with #3 AW 1 x 10     PATIENT EDUCATION:  Education details: form and technique for appropriate exercise. Explanation about plan of care   Person educated: Patient Education method: Explanation, Demonstration, Verbal cues, and Handouts Education comprehension: verbalized understanding, returned demonstration, verbal cues required, and tactile cues required     HOME EXERCISE PROGRAM: Access Code: [email or web address]: [email or web address] Date: 01/05/2022 Prepared by: DBradly Chris Exercises - Prone Quadriceps  Stretch with Strap  - 1 x daily - 7 x weekly - 3 reps - 30 hold - Seated Hip External Rotation Stretch  - 1 x daily - 7 x weekly - 3 reps - 30 hold - Sidelying Hip Abduction  - 1 x daily - 3 x weekly - 3 sets - 10 reps - Sidelying Hip Adduction  - 1 x daily - 3 x weekly - 3 sets - 10 reps - Mini Squat  - 1 x daily - 3 x weekly - 3 sets - 10 reps - Standing Single Leg Heel Raise  - 1 x daily - 3 x weekly - 3 sets - 10 reps - Side Lunge Adductor Stretch  - 1 x daily - 3 x weekly - 3 reps - 30 hold - Tandem Stance  - 1 x daily - 7 x weekly - 5 reps - 10 hold - Seated Table Hamstring Stretch  - 1 x daily - 3 reps - 30 hold - Gastroc Stretch on Step  - 1 x daily - 3 reps - 30 hold   ASSESSMENT:   CLINICAL IMPRESSION:  Session  focused around hip mobility, because of initial report of increased LE tension. Pt exhibits improvement in muscle tension as evidenced by subjective report of improvement in feeling stiff by end of session. She was able to perform all exercises without experiencing an increase in her pain. Session terminated early because pt needed to leave early to attend another apt. She will continue to benefit from skilled PT to improve hip mobility and strength to help resolve low back and hip pain to return to walking and playing recreational sports without needing to limit activity due to pain.    OBJECTIVE IMPAIRMENTS decreased endurance, difficulty walking, decreased ROM, decreased strength, obesity, and pain.    ACTIVITY LIMITATIONS bending, sitting, standing, squatting, sleeping, and locomotion level   PARTICIPATION LIMITATIONS: cleaning, laundry, driving, shopping, community activity, yard work, and playing recreational sports    [place] Age, Past/current experiences, and Time since onset of injury/illness/exacerbation are also affecting patient's functional outcome.    REHAB POTENTIAL: Fair chronic condition that did not fully resolve with first bout of PT   CLINICAL  DECISION MAKING: Stable/uncomplicated   EVALUATION COMPLEXITY: Low     GOALS: Goals reviewed with patient? No   SHORT TERM GOALS: Target date: 01/06/2022   Pt will be independent with HEP in order to improve strength and balance in order to decrease fall risk and improve function at home and work. Baseline: NT  Goal status: Ongoing    2. Pt will increase 6MWT by at least 3m(1666f in order to demonstrate clinically significant improvement in cardiopulmonary endurance and community ambulation. Baseline: 1,050 ft  Goal status: Ongoing        LONG TERM GOALS: Target date: 02/17/2022   Patient will have improved function and activity level as evidenced by an increase in FOTO score by 10 points or more.  Baseline: 45/100 with target of 61  Goal status: Ongoing    2.  Patient will improve left hip ROM to be symmetrical to right for improved biomechanics and joint loading during walking and transfers to decrease pain and return to recreational activities.  Baseline: Hip ER R/L 45/25  Goal status: Ongoing    3.  Patient will improve hip strength by 1/2 MMT to better support spinal structures to offload mechanical forces and to improve pain response to activity.  Baseline: Hip Abduction R/L 4/4, Hip Adduction R/L 4/4, Hip Ext R/L 4-/4-  Goal status: Ongoing    4. Patient will ambulate >= 1,000 ft without needing a standing rest break due to pain during 58m51mto show decreased pain in low back and left hip resulting in improved aerobic endurance.  Baseline: 1,050 ft  Goal status: Achieved      PLAN: PT FREQUENCY: 1-2x/week   PT DURATION: 8 weeks   PLANNED INTERVENTIONS: Therapeutic exercises, Neuromuscular re-education, Balance training, Gait training, Patient/Family education, Self Care, Joint mobilization, Aquatic Therapy, Dry Needling, Electrical stimulation, Spinal manipulation, Spinal mobilization, Cryotherapy, Moist heat, Manual therapy, and Re-evaluation.   PLAN FOR NEXT  SESSION: Trial walking one mile on TM and progress tandem stance exercise. Progress hip mobility and strength exercises.     DanBradly Chris, DPT  01/07/2022, 9:55 AM

## 2022-01-12 ENCOUNTER — Encounter: Payer: BLUE CROSS/BLUE SHIELD | Admitting: Physical Therapy

## 2022-01-14 ENCOUNTER — Encounter: Payer: BLUE CROSS/BLUE SHIELD | Admitting: Physical Therapy

## 2022-01-15 ENCOUNTER — Other Ambulatory Visit: Payer: Self-pay | Admitting: Nurse Practitioner

## 2022-01-15 DIAGNOSIS — M2550 Pain in unspecified joint: Secondary | ICD-10-CM

## 2022-01-18 ENCOUNTER — Ambulatory Visit: Payer: BLUE CROSS/BLUE SHIELD | Attending: Family Medicine | Admitting: Physical Therapy

## 2022-01-18 ENCOUNTER — Encounter: Payer: Self-pay | Admitting: Physical Therapy

## 2022-01-18 DIAGNOSIS — M25552 Pain in left hip: Secondary | ICD-10-CM | POA: Insufficient documentation

## 2022-01-18 DIAGNOSIS — M5459 Other low back pain: Secondary | ICD-10-CM | POA: Diagnosis not present

## 2022-01-18 DIAGNOSIS — R262 Difficulty in walking, not elsewhere classified: Secondary | ICD-10-CM | POA: Insufficient documentation

## 2022-01-18 NOTE — Therapy (Signed)
OUTPATIENT PHYSICAL THERAPY TREATMENT NOTE   Patient Name: Michaela Hardy MRN: [id number] DOB:May 23, 1955, 66 y.o., female Today's Date: 01/18/2022  PCP: Jon Billings NP  REFERRING PROVIDER: Dr. Park Liter   END OF SESSION:   PT End of Session - 01/18/22 1108     Visit Number 7    Number of Visits 16    Date for PT Re-Evaluation 02/17/22    Authorization Type BCBS    Authorization Time Period 12/22/21-02/17/22    Authorization - Visit Number 7    Authorization - Number of Visits 120    Progress Note Due on Visit 10    PT Start Time 1103    PT Stop Time 8185    PT Time Calculation (min) 42 min    Activity Tolerance Patient tolerated treatment well    Behavior During Therapy WFL for tasks assessed/performed             Past Medical History:  Diagnosis Date   Anxiety    Depression    GERD (gastroesophageal reflux disease)    Hepatitis    Primary bilary cholangitis   Hyperlipidemia    Hypertension    PONV (postoperative nausea and vomiting)    Past Surgical History:  Procedure Laterality Date   APPENDECTOMY  1976   BREAST BIOPSY Left 10/03/2014   Dr. Autumn Patty   BREAST EXCISIONAL BIOPSY Left    BREAST LUMPECTOMY WITH RADIOACTIVE SEED LOCALIZATION Left 11/21/2014   Procedure: LEFT BREAST LUMPECTOMY WITH RADIOACTIVE SEED LOCALIZATION;  Surgeon: Autumn Messing III, MD;  Location: [facility];  Service: General;  Laterality: Left;   CERVICAL BIOPSY  W/ LOOP ELECTRODE EXCISION     CHOLECYSTECTOMY  06/29/2007   LIVER BIOPSY  2008   TUBAL LIGATION  1987   Patient Active Problem List   Diagnosis Date Noted   Prediabetes 11/26/2020   Disease of liver 05/25/2019   Biliary cirrhosis ([place]) 05/25/2019   Elevated blood sugar 10/20/2015   Allergic sinusitis 01/14/2015   Eustachian tube dysfunction 01/14/2015   Primary biliary cholangitis ([place]) 11/15/2014   Other migraine, not intractable, without status migrainosus 08/10/2014   Depressive disorder 08/10/2014    Acid reflux 08/10/2014   Other specified bacterial intestinal infections 08/10/2014   Hypercholesteremia 08/10/2014   Essential hypertension 08/10/2014   Insomnia 08/10/2014   Gastro-esophageal reflux disease without esophagitis 08/10/2014   Major depressive disorder, single episode 08/10/2014    REFERRING DIAG: M47.26 (ICD-10-CM) - Osteoarthritis of spine with radiculopathy, lumbar region  THERAPY DIAG:  Other low back pain  Pain in left hip  Difficulty in walking, not elsewhere classified  Rationale for Evaluation and Treatment Rehabilitation  PERTINENT HISTORY: Last August pt sustained a left leg injury doing water aerobics. She had lingering pain that resolved after completing a bout of PT, but it has since returned along with low back pain to point where she needs to use AD when ambulating over uneven terrain.  PRECAUTIONS: None   SUBJECTIVE: Patient reports that she feels improvement when she spreads exercises out over the entire week. She feels that doing less exercises all at once has really helped.   PAIN:  Are you having pain? No   OBJECTIVE: (objective measures completed at initial evaluation unless otherwise dated)  VITALS: BP 160/70 HR 90 SpO2 99   DIAGNOSTIC FINDINGS:  CLINICAL DATA:  Low back pain   EXAM: LUMBAR SPINE - COMPLETE 4+ VIEW   COMPARISON:  None Available.   FINDINGS: Lumbar vertebral body height are  preserved without evidence of fracture. Grade 1 anterolisthesis of L4 on L5. No spondylolysis identified. Intervertebral disc spaces are preserved. Mild facet arthropathy. Calcified plaques in the abdominal aorta.   IMPRESSION: Degenerative changes of the lumbar spine as described.     PATIENT SURVEYS:  FOTO 45/100 with target of 51     SCREENING FOR RED FLAGS: Bowel or bladder incontinence: No Spinal tumors: No Cauda equina syndrome: No Compression fracture: No Abdominal aneurysm: No   COGNITION:           Overall cognitive  status: Within functional limits for tasks assessed                          SENSATION: WFL   MUSCLE LENGTH: Hamstrings: Right 90 deg; Left 90 deg Thomas test: Negative bilateral   POSTURE: No Significant postural limitations   PALPATION:Left glute med and bilateral low back.        LUMBAR ROM:    Active  A/PROM  eval  Flexion 100  Extension 100  Right lateral flexion 100  Left lateral flexion 100  Right rotation 100  Left rotation 100   (Blank rows = not tested)   LOWER EXTREMITY ROM:          Active  Right 12/22/2021 Left 12/22/2021  Hip flexion 120 120  Hip extension 30 30  Hip abduction 45 45  Hip adduction 30 30  Hip internal rotation 45 45  Hip external rotation 45 25*  Knee flexion 135 135  Knee extension 0 0   (Blank rows = not tested)        LOWER EXTREMITY MMT:     MMT Right eval Left eval  Hip flexion 5 5  Hip extension 4- 4-  Hip abduction 4 4  Hip adduction 4 4  Hip internal rotation  4+ 4+   Hip external rotation 4+  4+   Knee flexion 5 5  Knee extension 5 5  Ankle dorsiflexion 5 5  Ankle plantarflexion      Ankle inversion      Ankle eversion       (Blank rows = not tested)   LUMBAR SPECIAL TESTS:  Straight leg raise test: Negative, FABER test: Negative, Thomas test: Negative, and FADIR Negative    FUNCTIONAL TESTS:  None performed    GAIT: Distance walked: 40 ft  Assistive device utilized: None Level of assistance: Complete Independence Comments: No gait abnormalities noted        TODAY'S TREATMENT   01/18/22: TM with BUE support 1.5 mph for 10 min  Hip Abduction 3 x 10  -Red band with RLE and no band for LLE  Mini-Squats 3 x 10 with 8 lb water jug   NPS 3/10 in lateral side of left hip    01/07/22 Nu-Step seat and arm level at 9 with resistance 1 for 6 min  Review of delayed onset muscle soreness using handout   Side Lying Hip Adduction on LLE 3 x 10            -Pt reports no pain and feels like she just  needs to change beds from softer to firmer surface.             Mini-Squat with #8 jug of water held in elbow extension and shoulder flexed to 90 degrees  3 x 10   01/05/22:              Nu-Step seat  and arm level with resistance 1 for 7 min             Hip Adductor to Left Side 3 x 30 sec             HS Stretch R+ L 6 x 30 sec             Calf Stretch R+ L 6 x 30 sec    12/31/21:  Nu-Step seat and arm level with resistance 1 for 7 min   Standing Hip Adductor Stretch 3 x 30 sec   Seated Hip ER stretch on LLE 3 x 30 sec  -Pt did not experience increased pain when not placing overpressure over left knee   Mini-Squat into butt taps on 20 inch mat surface with #8 jug of water  3 x 10   SLS on LLE 2 x 10 sec with intermittent use of UE  -Pt unable to maintain balance   Tandem Stance with LLE in back postion 5 x 10 sec holds  12/29/21:  Nu-Step seat and arm level 9 with resistance 1 for 5 min   Side Lying Hip Adduction 3 x 10   Side Lying Hip Abduction 1 x 10 -Use red theraband for right leg    Mini-Squats with BUE support 3 x 10   Stand Heel Raises 1 x 10  Standing Heel Raises 1 x 10 with #8 water jug  Standing Heel Raises 1 x 10 with 2 x #8 water jug  Single leg heel raises on LLE 2 x 10    12/24/21:   Nu-Step Seat and arm level 9 with 1 resistance for 5 min  27mT: 1,050 ft  Hip ER R/L 4+/4+ Hip IR R/L 4+/4+  Standing Hip Abduction 2 x 10  -Pt reports increased pain in her left hip   Side Lying Hip Abduction 3 x 10 on LLE SLR with LLE 1 x 10              -Pt reports increased pain in left groin area             Standing marches with BUE on LLE 1 x 10             Standing marches with BUE on LLE with #3 AW 1 x 10     PATIENT EDUCATION:  Education details: form and technique for appropriate exercise. Explanation about plan of care   Person educated: Patient Education method: Explanation, Demonstration, Verbal cues, and Handouts Education comprehension:  verbalized understanding, returned demonstration, verbal cues required, and tactile cues required     HOME EXERCISE PROGRAM: Access Code: [email or web address]: [email or web address] Date: 01/05/2022 Prepared by: DBradly Chris Exercises - Prone Quadriceps Stretch with Strap  - 1 x daily - 7 x weekly - 3 reps - 30 hold - Seated Hip External Rotation Stretch  - 1 x daily - 7 x weekly - 3 reps - 30 hold - Sidelying Hip Abduction  - 1 x daily - 3 x weekly - 3 sets - 10 reps - Sidelying Hip Adduction  - 1 x daily - 3 x weekly - 3 sets - 10 reps - Mini Squat  - 1 x daily - 3 x weekly - 3 sets - 10 reps - Standing Single Leg Heel Raise  - 1 x daily - 3 x weekly - 3 sets - 10 reps - Side Lunge Adductor Stretch  - 1 x daily - 3 x weekly - 3 reps -  30 hold - Tandem Stance  - 1 x daily - 7 x weekly - 5 reps - 10 hold - Seated Table Hamstring Stretch  - 1 x daily - 3 reps - 30 hold - Gastroc Stretch on Step  - 1 x daily - 3 reps - 30 hold   ASSESSMENT:   CLINICAL IMPRESSION:  Pt continues to experience increased left hip pain with activity and she shows limited progression of her left hip strength because of ongoing pain. This is likely result of ongoing left hip OA which has improved since initial eval but she is still having increased pain especially with hip abduction. PT encouraged pt to attempt walking given improvement with weight bearing activity and pain response. She will continue to benefit from skilled PT to improve hip mobility and strength to help resolve low back and hip pain to return to walking and playing recreational sports without needing to limit activity due to pain.     OBJECTIVE IMPAIRMENTS decreased endurance, difficulty walking, decreased ROM, decreased strength, obesity, and pain.    ACTIVITY LIMITATIONS bending, sitting, standing, squatting, sleeping, and locomotion level   PARTICIPATION LIMITATIONS: cleaning, laundry, driving, shopping, community activity, yard  work, and playing recreational sports    [place] Age, Past/current experiences, and Time since onset of injury/illness/exacerbation are also affecting patient's functional outcome.    REHAB POTENTIAL: Fair chronic condition that did not fully resolve with first bout of PT   CLINICAL DECISION MAKING: Stable/uncomplicated   EVALUATION COMPLEXITY: Low     GOALS: Goals reviewed with patient? No   SHORT TERM GOALS: Target date: 01/06/2022   Pt will be independent with HEP in order to improve strength and balance in order to decrease fall risk and improve function at home and work. Baseline: NT  Goal status: Ongoing    2. Pt will increase 6MWT by at least 61m(1620f in order to demonstrate clinically significant improvement in cardiopulmonary endurance and community ambulation. Baseline: 1,050 ft  Goal status: Ongoing        LONG TERM GOALS: Target date: 02/17/2022   Patient will have improved function and activity level as evidenced by an increase in FOTO score by 10 points or more.  Baseline: 45/100 with target of 61  Goal status: Ongoing    2.  Patient will improve left hip ROM to be symmetrical to right for improved biomechanics and joint loading during walking and transfers to decrease pain and return to recreational activities.  Baseline: Hip ER R/L 45/25  Goal status: Ongoing    3.  Patient will improve hip strength by 1/2 MMT to better support spinal structures to offload mechanical forces and to improve pain response to activity.  Baseline: Hip Abduction R/L 4/4, Hip Adduction R/L 4/4, Hip Ext R/L 4-/4-  Goal status: Ongoing    4. Patient will ambulate >= 1,000 ft without needing a standing rest break due to pain during 31m74mto show decreased pain in low back and left hip resulting in improved aerobic endurance.  Baseline: 1,050 ft  Goal status: Achieved      PLAN: PT FREQUENCY: 1-2x/week   PT DURATION: 8 weeks   PLANNED INTERVENTIONS: Therapeutic  exercises, Neuromuscular re-education, Balance training, Gait training, Patient/Family education, Self Care, Joint mobilization, Aquatic Therapy, Dry Needling, Electrical stimulation, Spinal manipulation, Spinal mobilization, Cryotherapy, Moist heat, Manual therapy, and Re-evaluation.   PLAN FOR NEXT SESSION: Progress hip strengthening exercises with attempt to increase resistance without an increase in pain  Bradly Chris PT, DPT  01/18/2022, 11:09 AM

## 2022-01-19 NOTE — Telephone Encounter (Signed)
Requested Prescriptions  Pending Prescriptions Disp Refills  . meloxicam (MOBIC) 15 MG tablet [Pharmacy Med Name: MELOXICAM 15 MG TABLET] 90 tablet 1    Sig: TAKE ONE TABLET BY MOUTH DAILY AS NEEDED FOR PAIN     Analgesics:  COX2 Inhibitors Failed - 01/15/2022  3:52 PM      Failed - Manual Review: Labs are only required if the patient has taken medication for more than 8 weeks.      Failed - ALT in normal range and within 360 days    ALT  Date Value Ref Range Status  12/28/2021 45 (H) 0 - 32 IU/L Final         Passed - HGB in normal range and within 360 days    Hemoglobin  Date Value Ref Range Status  06/26/2021 12.4 11.1 - 15.9 g/dL Final         Passed - Cr in normal range and within 360 days    Creatinine, Ser  Date Value Ref Range Status  12/28/2021 0.78 0.57 - 1.00 mg/dL Final         Passed - HCT in normal range and within 360 days    Hematocrit  Date Value Ref Range Status  06/26/2021 38.1 34.0 - 46.6 % Final         Passed - AST in normal range and within 360 days    AST  Date Value Ref Range Status  12/28/2021 31 0 - 40 IU/L Final         Passed - eGFR is 30 or above and within 360 days    GFR calc Af Amer  Date Value Ref Range Status  08/30/2019 108 >59 mL/min/1.73 Final   GFR calc non Af Amer  Date Value Ref Range Status  08/30/2019 94 >59 mL/min/1.73 Final   eGFR  Date Value Ref Range Status  12/28/2021 84 >59 mL/min/1.73 Final         Passed - Patient is not pregnant      Passed - Valid encounter within last 12 months    Recent Outpatient Visits          3 weeks ago Biliary cirrhosis ([place])   [facility] Jon Billings, NP   1 month ago Chronic left-sided low back pain with left-sided sciatica   [facility] [place], Megan P, DO   6 months ago Annual physical exam   [facility] Jon Billings, NP   1 year ago Muscle strain   [facility] Jon Billings, NP   1 year ago Muscle strain  of left thigh, initial encounter   [place], Lauren A, NP      Future Appointments            In 5 months Jon Billings, NP [facility], [facility]

## 2022-01-21 ENCOUNTER — Encounter: Payer: Self-pay | Admitting: Physical Therapy

## 2022-01-21 ENCOUNTER — Ambulatory Visit: Payer: BLUE CROSS/BLUE SHIELD | Admitting: Physical Therapy

## 2022-01-21 DIAGNOSIS — M25552 Pain in left hip: Secondary | ICD-10-CM | POA: Diagnosis not present

## 2022-01-21 DIAGNOSIS — M5459 Other low back pain: Secondary | ICD-10-CM

## 2022-01-21 DIAGNOSIS — R262 Difficulty in walking, not elsewhere classified: Secondary | ICD-10-CM

## 2022-01-21 NOTE — Therapy (Addendum)
OUTPATIENT PHYSICAL THERAPY DISCHARGE SUMMARY   Patient Name: Michaela Hardy MRN: [id number] DOB:[birth year], 66 y.o., female Today's Date: 01/21/2022  PCP: Jon Billings NP  REFERRING PROVIDER: Dr. Park Liter   END OF SESSION:   PT End of Session - 01/21/22 1023     Visit Number 8    Number of Visits 16    Date for PT Re-Evaluation 02/17/22    Authorization Type BCBS    Authorization Time Period 12/22/21-02/17/22    Authorization - Visit Number 8    Authorization - Number of Visits 12    Progress Note Due on Visit 10    PT Start Time 8338    PT Stop Time 1045    PT Time Calculation (min) 30 min    Activity Tolerance Patient tolerated treatment well    Behavior During Therapy WFL for tasks assessed/performed             Past Medical History:  Diagnosis Date   Anxiety    Depression    GERD (gastroesophageal reflux disease)    Hepatitis    Primary bilary cholangitis   Hyperlipidemia    Hypertension    PONV (postoperative nausea and vomiting)    Past Surgical History:  Procedure Laterality Date   APPENDECTOMY  1976   BREAST BIOPSY Left 10/03/2014   Dr. Autumn Patty   BREAST EXCISIONAL BIOPSY Left    BREAST LUMPECTOMY WITH RADIOACTIVE SEED LOCALIZATION Left 11/21/2014   Procedure: LEFT BREAST LUMPECTOMY WITH RADIOACTIVE SEED LOCALIZATION;  Surgeon: Autumn Messing III, MD;  Location: [facility];  Service: General;  Laterality: Left;   CERVICAL BIOPSY  W/ LOOP ELECTRODE EXCISION     CHOLECYSTECTOMY  06/29/2007   LIVER BIOPSY  2008   TUBAL LIGATION  1987   Patient Active Problem List   Diagnosis Date Noted   Prediabetes 11/26/2020   Disease of liver 05/25/2019   Biliary cirrhosis ([place]) 05/25/2019   Elevated blood sugar 10/20/2015   Allergic sinusitis 01/14/2015   Eustachian tube dysfunction 01/14/2015   Primary biliary cholangitis ([place]) 11/15/2014   Other migraine, not intractable, without status migrainosus 08/10/2014   Depressive disorder  08/10/2014   Acid reflux 08/10/2014   Other specified bacterial intestinal infections 08/10/2014   Hypercholesteremia 08/10/2014   Essential hypertension 08/10/2014   Insomnia 08/10/2014   Gastro-esophageal reflux disease without esophagitis 08/10/2014   Major depressive disorder, single episode 08/10/2014    REFERRING DIAG: M47.26 (ICD-10-CM) - Osteoarthritis of spine with radiculopathy, lumbar region  THERAPY DIAG:  Other low back pain  Pain in left hip  Difficulty in walking, not elsewhere classified  Rationale for Evaluation and Treatment Rehabilitation  PERTINENT HISTORY: Last August pt sustained a left leg injury doing water aerobics. She had lingering pain that resolved after completing a bout of PT, but it has since returned along with low back pain to point where she needs to use AD when ambulating over uneven terrain.  PRECAUTIONS: None   SUBJECTIVE: Patient reports ongoing improvement with physical function and decreased pain.   PAIN:  Are you having pain? No   OBJECTIVE: (objective measures completed at initial evaluation unless otherwise dated)  VITALS: BP 160/70 HR 90 SpO2 99   DIAGNOSTIC FINDINGS:  CLINICAL DATA:  Low back pain   EXAM: LUMBAR SPINE - COMPLETE 4+ VIEW   COMPARISON:  None Available.   FINDINGS: Lumbar vertebral body height are preserved without evidence of fracture. Grade 1 anterolisthesis of L4 on L5. No spondylolysis identified. Intervertebral disc  spaces are preserved. Mild facet arthropathy. Calcified plaques in the abdominal aorta.   IMPRESSION: Degenerative changes of the lumbar spine as described.     PATIENT SURVEYS:  FOTO 45/100 with target of 32     SCREENING FOR RED FLAGS: Bowel or bladder incontinence: No Spinal tumors: No Cauda equina syndrome: No Compression fracture: No Abdominal aneurysm: No   COGNITION:           Overall cognitive status: Within functional limits for tasks assessed                           SENSATION: WFL   MUSCLE LENGTH: Hamstrings: Right 90 deg; Left 90 deg Thomas test: Negative bilateral   POSTURE: No Significant postural limitations   PALPATION:Left glute med and bilateral low back.        LUMBAR ROM:    Active  A/PROM  eval  Flexion 100  Extension 100  Right lateral flexion 100  Left lateral flexion 100  Right rotation 100  Left rotation 100   (Blank rows = not tested)   LOWER EXTREMITY ROM:          Active  Right 12/22/2021 Left 12/22/2021  Hip flexion 120 120  Hip extension 30 30  Hip abduction 45 45  Hip adduction 30 30  Hip internal rotation 45 45  Hip external rotation 45 25*  Knee flexion 135 135  Knee extension 0 0   (Blank rows = not tested)        LOWER EXTREMITY MMT:     MMT Right eval Left eval Left  9/14   Hip flexion 5 5   Hip extension 4- 4- 4+  Hip abduction 4 4 4+  Hip adduction 4 4 4+  Hip internal rotation  4+ 4+    Hip external rotation 4+  4+    Knee flexion 5 5   Knee extension 5 5   Ankle dorsiflexion 5 5   Ankle plantarflexion       Ankle inversion       Ankle eversion        (Blank rows = not tested)   LUMBAR SPECIAL TESTS:  Straight leg raise test: Negative, FABER test: Negative, Thomas test: Negative, and FADIR Negative    FUNCTIONAL TESTS:  None performed    GAIT: Distance walked: 40 ft  Assistive device utilized: None Level of assistance: Complete Independence Comments: No gait abnormalities noted        TODAY'S TREATMENT   01/21/22: Nu-Step seat and arm level at 9 with resistance 1 for 6 min Discussion about exercise progressions for HEP  MMT of left Hip -See above in objective measures   01/18/22: TM with BUE support 1.5 mph for 10 min  Hip Abduction 3 x 10  -Red band with RLE and no band for LLE  Mini-Squats 3 x 10 with 8 lb water jug   NPS 3/10 in lateral side of left hip    01/07/22 Nu-Step seat and arm level at 9 with resistance 1 for 6 min  Review of delayed onset  muscle soreness using handout   Side Lying Hip Adduction on LLE 3 x 10            -Pt reports no pain and feels like she just needs to change beds from softer to firmer surface.             Mini-Squat with #8 jug of water held  in elbow extension and shoulder flexed to 90 degrees  3 x 10   01/05/22:              Nu-Step seat and arm level with resistance 1 for 7 min             Hip Adductor to Left Side 3 x 30 sec             HS Stretch R+ L 6 x 30 sec             Calf Stretch R+ L 6 x 30 sec    12/31/21:  Nu-Step seat and arm level with resistance 1 for 7 min   Standing Hip Adductor Stretch 3 x 30 sec   Seated Hip ER stretch on LLE 3 x 30 sec  -Pt did not experience increased pain when not placing overpressure over left knee   Mini-Squat into butt taps on 20 inch mat surface with #8 jug of water  3 x 10   SLS on LLE 2 x 10 sec with intermittent use of UE  -Pt unable to maintain balance   Tandem Stance with LLE in back postion 5 x 10 sec holds  12/29/21:  Nu-Step seat and arm level 9 with resistance 1 for 5 min   Side Lying Hip Adduction 3 x 10   Side Lying Hip Abduction 1 x 10 -Use red theraband for right leg    Mini-Squats with BUE support 3 x 10   Stand Heel Raises 1 x 10  Standing Heel Raises 1 x 10 with #8 water jug  Standing Heel Raises 1 x 10 with 2 x #8 water jug  Single leg heel raises on LLE 2 x 10    12/24/21:   Nu-Step Seat and arm level 9 with 1 resistance for 5 min  8mT: 1,050 ft  Hip ER R/L 4+/4+ Hip IR R/L 4+/4+  Standing Hip Abduction 2 x 10  -Pt reports increased pain in her left hip   Side Lying Hip Abduction 3 x 10 on LLE SLR with LLE 1 x 10              -Pt reports increased pain in left groin area             Standing marches with BUE on LLE 1 x 10             Standing marches with BUE on LLE with #3 AW 1 x 10     PATIENT EDUCATION:  Education details: form and technique for appropriate exercise. Explanation about plan of care    Person educated: Patient Education method: Explanation, Demonstration, Verbal cues, and Handouts Education comprehension: verbalized understanding, returned demonstration, verbal cues required, and tactile cues required     HOME EXERCISE PROGRAM: Access Code: [email or web address]: [email or web address] Date: 01/21/2022 Prepared by: DBradly Chris Exercises - Prone Quadriceps Stretch with Strap  - 1 x daily - 7 x weekly - 3 reps - 30 hold - Seated Hip External Rotation Stretch  - 1 x daily - 7 x weekly - 3 reps - 30 hold - Sidelying Hip Abduction  - 1 x daily - 3 x weekly - 3 sets - 10 reps - Sidelying Hip Adduction  - 1 x daily - 3 x weekly - 3 sets - 10 reps - Mini Squat  - 1 x daily - 3 x weekly - 3 sets - 10 reps - Standing Single Leg Heel Raise  -  1 x daily - 3 x weekly - 3 sets - 10 reps - Side Lunge Adductor Stretch  - 1 x daily - 3 x weekly - 3 reps - 30 hold - Tandem Stance  - 1 x daily - 7 x weekly - 5 reps - 10 hold - Seated Table Hamstring Stretch  - 1 x daily - 3 reps - 30 hold - Gastroc Stretch on Step  - 1 x daily - 3 reps - 30 hold   ASSESSMENT:   CLINICAL IMPRESSION:  Pt has met nearly all her goals with stronger LE strength and improved self-perceived function. She is ready for discharge and PT spent session educating pt on exercise progressions for her HEP. Pt is ready for discharge.   OBJECTIVE IMPAIRMENTS decreased endurance, difficulty walking, decreased ROM, decreased strength, obesity, and pain.    ACTIVITY LIMITATIONS bending, sitting, standing, squatting, sleeping, and locomotion level   PARTICIPATION LIMITATIONS: cleaning, laundry, driving, shopping, community activity, yard work, and playing recreational sports    [place] Age, Past/current experiences, and Time since onset of injury/illness/exacerbation are also affecting patient's functional outcome.    REHAB POTENTIAL: Fair chronic condition that did not fully resolve with first  bout of PT   CLINICAL DECISION MAKING: Stable/uncomplicated   EVALUATION COMPLEXITY: Low     GOALS: Goals reviewed with patient? No   SHORT TERM GOALS: Target date: 01/06/2022   Pt will be independent with HEP in order to improve strength and balance in order to decrease fall risk and improve function at home and work. Baseline: NT  Goal status: Ongoing    2. Pt will increase 6MWT by at least 28m(1665f in order to demonstrate clinically significant improvement in cardiopulmonary endurance and community ambulation. Baseline: 1,050 ft  Goal status: Deferred        LONG TERM GOALS: Target date: 02/17/2022   Patient will have improved function and activity level as evidenced by an increase in FOTO score by 10 points or more.  Baseline: 45/100 with target of 61 01/21/22: 75/100  Goal status: Achieved    2.  Patient will improve left hip ROM to be symmetrical to right for improved biomechanics and joint loading during walking and transfers to decrease pain and return to recreational activities.  Baseline: Hip ER R/L 45/25  Goal status: Deferred    3.  Patient will improve hip strength by 1/2 MMT to better support spinal structures to offload mechanical forces and to improve pain response to activity.  Baseline: Hip Abduction R/L 4/4, Hip Adduction R/L 4/4, Hip Ext R/L 4-/4- 01/21/22: Left Hip Add 4+, Abd 4+, Ext 4+ Goal status: Achieved    4. Patient will ambulate >= 1,000 ft without needing a standing rest break due to pain during 56m79mto show decreased pain in low back and left hip resulting in improved aerobic endurance.  Baseline: 1,050 ft  Goal status: Achieved      PLAN: PT FREQUENCY: 1-2x/week   PT DURATION: 8 weeks   PLANNED INTERVENTIONS: Therapeutic exercises, Neuromuscular re-education, Balance training, Gait training, Patient/Family education, Self Care, Joint mobilization, Aquatic Therapy, Dry Needling, Electrical stimulation, Spinal manipulation, Spinal  mobilization, Cryotherapy, Moist heat, Manual therapy, and Re-evaluation.   PLAN FOR NEXT SESSION: Discharge from PT [place], DPT  01/21/2022, 10:58 AM

## 2022-02-01 ENCOUNTER — Encounter: Payer: BLUE CROSS/BLUE SHIELD | Admitting: Physical Therapy

## 2022-02-04 ENCOUNTER — Encounter: Payer: BLUE CROSS/BLUE SHIELD | Admitting: Physical Therapy

## 2022-02-11 ENCOUNTER — Other Ambulatory Visit: Payer: Self-pay | Admitting: Obstetrics and Gynecology

## 2022-02-11 DIAGNOSIS — N6489 Other specified disorders of breast: Secondary | ICD-10-CM

## 2022-03-16 DIAGNOSIS — K743 Primary biliary cirrhosis: Secondary | ICD-10-CM | POA: Diagnosis not present

## 2022-03-22 ENCOUNTER — Other Ambulatory Visit: Payer: Self-pay | Admitting: Gastroenterology

## 2022-03-22 DIAGNOSIS — K743 Primary biliary cirrhosis: Secondary | ICD-10-CM

## 2022-03-23 ENCOUNTER — Ambulatory Visit
Admission: RE | Admit: 2022-03-23 | Discharge: 2022-03-23 | Disposition: A | Discharge status: Home / Self Care | Payer: BLUE CROSS/BLUE SHIELD | Source: Ambulatory Visit | Attending: Obstetrics and Gynecology | Admitting: Obstetrics and Gynecology

## 2022-03-23 DIAGNOSIS — N6489 Other specified disorders of breast: Secondary | ICD-10-CM

## 2022-03-23 DIAGNOSIS — R92323 Mammographic fibroglandular density, bilateral breasts: Secondary | ICD-10-CM | POA: Diagnosis not present

## 2022-03-25 ENCOUNTER — Ambulatory Visit
Admission: RE | Admit: 2022-03-25 | Discharge: 2022-03-25 | Disposition: A | Discharge status: Home / Self Care | Payer: BLUE CROSS/BLUE SHIELD | Source: Ambulatory Visit | Attending: Gastroenterology | Admitting: Gastroenterology

## 2022-03-25 DIAGNOSIS — K743 Primary biliary cirrhosis: Secondary | ICD-10-CM | POA: Diagnosis not present

## 2022-05-17 ENCOUNTER — Encounter: Payer: Self-pay | Admitting: Nurse Practitioner

## 2022-05-17 ENCOUNTER — Ambulatory Visit (INDEPENDENT_AMBULATORY_CARE_PROVIDER_SITE_OTHER): Payer: Medicare Other | Admitting: Nurse Practitioner

## 2022-05-17 VITALS — BP 138/85 | HR 73 | Temp 98.8°F | Wt 216.1 lb

## 2022-05-17 DIAGNOSIS — I10 Essential (primary) hypertension: Secondary | ICD-10-CM

## 2022-05-17 MED ORDER — LOSARTAN POTASSIUM-HCTZ 100-25 MG PO TABS: 1.0000 | ORAL_TABLET | Freq: Every day | ORAL | 1 refills | Status: DC

## 2022-05-17 NOTE — Assessment & Plan Note (Signed)
Chronic.  Controlled.  Will change medication from [place] with HCTZ to [place] with HCTZ.  Patient has an appt next month for reevaluation.  Call sooner if concerns arise.

## 2022-05-17 NOTE — Progress Notes (Signed)
BP 138/85   Pulse 73   Temp 98.8 F (37.1 C) (Oral)   Wt 216 lb 1.6 oz (98 kg)   SpO2 98%   BMI 34.25 kg/m    Subjective:    Patient ID: Michaela Hardy, female    DOB: Feb 18, 1956, 67 y.o.   MRN: [id number]  HPI: Michaela Hardy is a 67 y.o. female  Chief Complaint  Patient presents with   Hypertension   HYPERTENSION / [place] Patient states she is doing well.  She changed prescription drug plans and Telmisartan is Tier 3.  Losartan is Tier 1 and she would like to change the medication.   Satisfied with current treatment? yes Duration of hypertension: years BP monitoring frequency: not checking BP range:  BP medication side effects: no Past BP meds:  Temisartan with HCTZ Duration of hyperlipidemia: years Cholesterol medication side effects: no Cholesterol supplements: none Past cholesterol medications: rosuvastatin (crestor) Medication compliance: excellent compliance Aspirin: no Recent stressors: no Recurrent headaches: no Visual changes: no Palpitations: no Dyspnea: no Chest pain: no Lower extremity edema: no Dizzy/lightheaded: no   Relevant past medical, surgical, family and social history reviewed and updated as indicated. Interim medical history since our last visit reviewed. Allergies and medications reviewed and updated.  Review of Systems  Eyes:  Negative for visual disturbance.  Respiratory:  Negative for cough, chest tightness and shortness of breath.   Cardiovascular:  Negative for chest pain, palpitations and leg swelling.  Neurological:  Negative for dizziness and headaches.  Psychiatric/Behavioral:  Negative for dysphoric mood and suicidal ideas. The patient is not nervous/anxious.     Per HPI unless specifically indicated above     Objective:    BP 138/85   Pulse 73   Temp 98.8 F (37.1 C) (Oral)   Wt 216 lb 1.6 oz (98 kg)   SpO2 98%   BMI 34.25 kg/m   Wt Readings from Last 3 Encounters:  05/17/22 216 lb 1.6 oz (98 kg)   12/28/21 214 lb 9.6 oz (97.3 kg)  11/24/21 212 lb 6.4 oz (96.3 kg)    Physical Exam Vitals and nursing note reviewed.  Constitutional:      General: She is not in acute distress.    Appearance: Normal appearance. She is obese. She is not ill-appearing, toxic-appearing or diaphoretic.  HENT:     Head: Normocephalic.     Right Ear: External ear normal.     Left Ear: External ear normal.     Nose: Nose normal.     Mouth/Throat:     Mouth: Mucous membranes are moist.     Pharynx: Oropharynx is clear.  Eyes:     General:        Right eye: No discharge.        Left eye: No discharge.     Extraocular Movements: Extraocular movements intact.     Conjunctiva/sclera: Conjunctivae normal.     Pupils: Pupils are equal, round, and reactive to light.  Cardiovascular:     Rate and Rhythm: Normal rate and regular rhythm.     Heart sounds: No murmur heard. Pulmonary:     Effort: Pulmonary effort is normal. No respiratory distress.     Breath sounds: Normal breath sounds. No wheezing or rales.  Musculoskeletal:     Cervical back: Normal range of motion and neck supple.  Skin:    General: Skin is warm and dry.     Capillary Refill: Capillary refill takes less than 2 seconds.  Neurological:     General: No focal deficit present.     Mental Status: She is alert and oriented to person, place, and time. Mental status is at baseline.  Psychiatric:        Mood and Affect: Mood normal.        Behavior: Behavior normal.        Thought Content: Thought content normal.        Judgment: Judgment normal.     Results for orders placed or performed in visit on 12/28/21  Comp Met (CMET)  Result Value Ref Range   Glucose 116 (H) 70 - 99 mg/dL   BUN 11 8 - 27 mg/dL   Creatinine, Ser 0.78 0.57 - 1.00 mg/dL   eGFR 84 >59 mL/min/1.73   BUN/Creatinine Ratio 14 12 - 28   Sodium 138 134 - 144 mmol/L   Potassium 4.1 3.5 - 5.2 mmol/L   Chloride 98 96 - 106 mmol/L   CO2 22 20 - 29 mmol/L   Calcium 9.4  8.7 - 10.3 mg/dL   Total Protein 7.4 6.0 - 8.5 g/dL   Albumin 4.5 3.9 - 4.9 g/dL   Globulin, Total 2.9 1.5 - 4.5 g/dL   Albumin/Globulin Ratio 1.6 1.2 - 2.2   Bilirubin Total <0.2 0.0 - 1.2 mg/dL   Alkaline Phosphatase 192 (H) 44 - 121 IU/L   AST 31 0 - 40 IU/L   ALT 45 (H) 0 - 32 IU/L  Lipid Profile  Result Value Ref Range   Cholesterol, Total 165 100 - 199 mg/dL   Triglycerides 190 (H) 0 - 149 mg/dL   HDL 54 >39 mg/dL   VLDL Cholesterol Cal 32 5 - 40 mg/dL   LDL Chol Calc (NIH) 79 0 - 99 mg/dL   Chol/HDL Ratio 3.1 0.0 - 4.4 ratio  HgB A1c  Result Value Ref Range   Hgb A1c MFr Bld 6.1 (H) 4.8 - 5.6 %   Est. average glucose Bld gHb Est-mCnc 128 mg/dL      Assessment & Plan:   Problem List Items Addressed This Visit       Cardiovascular and Mediastinum   Essential hypertension - Primary    Chronic.  Controlled.  Will change medication from [place] with HCTZ to [place] with HCTZ.  Patient has an appt next month for reevaluation.  Call sooner if concerns arise.       Relevant Medications   losartan-hydrochlorothiazide (HYZAAR) 100-25 MG tablet     Follow up plan: Return for [facility] scheduled follow up.

## 2022-06-30 ENCOUNTER — Ambulatory Visit: Payer: Medicare Other | Admitting: Nurse Practitioner

## 2022-06-30 ENCOUNTER — Encounter: Payer: Self-pay | Admitting: Nurse Practitioner

## 2022-06-30 VITALS — BP 130/75 | HR 73 | Temp 98.1°F | Wt 211.1 lb

## 2022-06-30 DIAGNOSIS — I10 Essential (primary) hypertension: Secondary | ICD-10-CM | POA: Diagnosis not present

## 2022-06-30 DIAGNOSIS — K745 Biliary cirrhosis, unspecified: Secondary | ICD-10-CM

## 2022-06-30 DIAGNOSIS — E7800 Pure hypercholesterolemia, unspecified: Secondary | ICD-10-CM | POA: Diagnosis not present

## 2022-06-30 DIAGNOSIS — Z7189 Other specified counseling: Secondary | ICD-10-CM | POA: Insufficient documentation

## 2022-06-30 DIAGNOSIS — M2550 Pain in unspecified joint: Secondary | ICD-10-CM

## 2022-06-30 DIAGNOSIS — Z0000 Encounter for general adult medical examination without abnormal findings: Secondary | ICD-10-CM

## 2022-06-30 MED ORDER — MELOXICAM 15 MG PO TABS: ORAL_TABLET | ORAL | 1 refills | Status: DC

## 2022-06-30 MED ORDER — ROSUVASTATIN CALCIUM 10 MG PO TABS: 10.0000 mg | ORAL_TABLET | Freq: Every day | ORAL | 1 refills | Status: DC

## 2022-06-30 NOTE — Assessment & Plan Note (Signed)
Chronic.  Controlled.  Crestor increased to 25m.   Labs ordered today.  Return to clinic in 6 months for reevaluation.  Call sooner if concerns arise.

## 2022-06-30 NOTE — Assessment & Plan Note (Signed)
Chronic.  Controlled.  Continue with current medication regimen of Losartan with HCTZ.  Labs ordered today.  Return to clinic in 3 months for reevaluation.  Call sooner if concerns arise.

## 2022-06-30 NOTE — Progress Notes (Signed)
Results discussed with patient during visit.

## 2022-06-30 NOTE — Assessment & Plan Note (Signed)
A voluntary discussion about advance care planning including the explanation and discussion of advance directives was extensively discussed  with the patient for 10 minutes with patient.  Explanation about the health care proxy and Living will was reviewed and packet with forms with explanation of how to fill them out was given.  During this discussion, the patient was able to identify a health care proxy as one of her children and plans to fill out the paperwork required.  Patient was offered a separate [place] visit for further assistance with forms.

## 2022-06-30 NOTE — Assessment & Plan Note (Signed)
Chronic.  Followed by [facility].  Recent AST and ALT trended up.  Has follow up scheduled in March.

## 2022-06-30 NOTE — Progress Notes (Signed)
BP 130/75   Pulse 73   Temp 98.1 F (36.7 C) (Oral)   Wt 211 lb 1.6 oz (95.8 kg)   SpO2 97%   BMI 33.46 kg/m    Subjective:    Patient ID: Michaela Hardy, female    DOB: 1955/09/03, 67 y.o.   MRN: [id number]  HPI: Michaela Hardy is a 67 y.o. female presenting on 06/30/2022 for comprehensive medical examination. Current medical complaints include:none  She currently lives with: her husband Menopausal Symptoms: no  HYPERTENSION / HYPERLIPIDEMIA Satisfied with current treatment? yes Duration of hypertension: years BP monitoring frequency: not checking BP range:  BP medication side effects: no Past BP meds:  losartan with HCTZ Duration of hyperlipidemia: years Cholesterol medication side effects: no Cholesterol supplements: none Past cholesterol medications: rosuvastatin (crestor) Medication compliance: excellent compliance Aspirin: no Recent stressors: no Recurrent headaches: no Visual changes: no Palpitations: no Dyspnea: no Chest pain: no Lower extremity edema: no Dizzy/lightheaded: no   MOOD Feels like her mood has been fine.  She is doing well and denies concerns at visit today.      Functional Status Survey: Is the patient deaf or have difficulty hearing?: No Does the patient have difficulty seeing, even when wearing glasses/contacts?: No Does the patient have difficulty concentrating, remembering, or making decisions?: No Does the patient have difficulty walking or climbing stairs?: No Does the patient have difficulty dressing or bathing?: No Does the patient have difficulty doing errands alone such as visiting a doctor's office or shopping?: No     06/30/2022    9:32 AM 05/17/2022    9:49 AM 12/28/2021    2:26 PM 11/24/2021    9:29 AM 06/26/2021    2:10 PM  [place] in the past year? 0 0 0 0 0  Number falls in past yr: 0 0 0 0 0  Injury with Fall? 0 0 0 0 0  Risk for fall due to : No Fall Risks No Fall Risks No Fall Risks  No Fall Risks   Follow up Falls evaluation completed Falls evaluation completed Falls evaluation completed  Falls evaluation completed    Depression Screen    06/30/2022    9:32 AM 05/17/2022    9:49 AM 12/28/2021    2:26 PM 11/24/2021    9:29 AM 06/26/2021    2:11 PM  Depression screen PHQ 2/9  Decreased Interest 0 0 0 0 0  Down, Depressed, Hopeless 0 0 0 0 0  PHQ - 2 Score 0 0 0 0 0  Altered sleeping 0 0 0 0 0  Tired, decreased energy 0 0 0 0 0  Change in appetite 0 0 0 0 0  Feeling bad or failure about yourself  0 0 0 0 0  Trouble concentrating 0 0 0 0 0  Moving slowly or fidgety/restless 0 0 0 0 0  Suicidal thoughts 0 0 0 0 0  PHQ-9 Score 0 0 0 0 0  Difficult doing work/chores Not difficult at all Not difficult at all Not difficult at all Not difficult at all Not difficult at all     Advanced Directives Does patient have a HCPOA?    no If yes, name and contact information: One of her children Does patient have a living will or MOST form?  no  Past Medical History:  Past Medical History:  Diagnosis Date   Anxiety    Depression    GERD (gastroesophageal reflux disease)    Hepatitis  Primary bilary cholangitis   Hyperlipidemia    Hypertension    PONV (postoperative nausea and vomiting)     Surgical History:  Past Surgical History:  Procedure Laterality Date   APPENDECTOMY  1976   BREAST BIOPSY Left 10/03/2014   Dr. Autumn Patty   BREAST EXCISIONAL BIOPSY Left    BREAST LUMPECTOMY WITH RADIOACTIVE SEED LOCALIZATION Left 11/21/2014   Procedure: LEFT BREAST LUMPECTOMY WITH RADIOACTIVE SEED LOCALIZATION;  Surgeon: Autumn Messing III, MD;  Location: [facility];  Service: General;  Laterality: Left;   CERVICAL BIOPSY  W/ LOOP ELECTRODE EXCISION     CHOLECYSTECTOMY  06/29/2007   LIVER BIOPSY  2008   TUBAL LIGATION  1987    Medications:  Current Outpatient Medications on File Prior to Visit  Medication Sig   calcium carbonate (OS-CAL) 600 MG TABS tablet Take 1 tablet by mouth  daily with breakfast.    losartan-hydrochlorothiazide (HYZAAR) 100-25 MG tablet Take 1 tablet by mouth daily.   Multiple Vitamin tablet Take 1 tablet by mouth daily.   omeprazole (PRILOSEC) 20 MG capsule Take 1 capsule by mouth daily.   ursodiol (ACTIGALL) 300 MG capsule Take 2 capsules by mouth 2 (two) times daily.   No current facility-administered medications on file prior to visit.    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  Tobacco Use   Smoking status: Former    Packs/day: 1.50    Years: 40.00    Total pack years: 60.00    Types: Cigarettes    Quit date: 05/10/2004    Years since quitting: 18.1   Smokeless tobacco: Never  Vaping Use   Vaping Use: Never used  Substance and Sexual Activity   Alcohol use: Yes    Alcohol/week: 2.0 - 4.0 standard drinks of alcohol    Types: 2 - 4 Glasses of wine per week   Drug use: No   Sexual activity: Not Currently  Other Topics Concern   Not on file  Social History Narrative   Not on file   Social Determinants of Health   Financial Resource Strain: Not on file  Food Insecurity: Not on file  Transportation Needs: Not on file  Physical Activity: Not on file  Stress: Not on file  Social Connections: Not on file  Intimate Partner Violence: Not on file   Social History   Tobacco Use  Smoking Status Former   Packs/day: 1.50   Years: 40.00   Total pack years: 60.00   Types: Cigarettes   Quit date: 05/10/2004   Years since quitting: 18.1  Smokeless Tobacco Never   Social History   Substance and Sexual  Activity  Alcohol Use Yes   Alcohol/week: 2.0 - 4.0 standard drinks of alcohol   Types: 2 - 4 Glasses of wine per week    Family History:  Family History  Problem Relation Age of Onset   Hypertension Mother    Pancreatitis Mother    Thyroid disease Mother    Hypertension Father    Heart  attack Father    Cerebrovascular Accident Father    Coronary artery disease Father    Glaucoma Father    Cancer Father        lung   Parkinson's disease Maternal Grandmother    Breast cancer Neg Hx    Colon cancer Neg Hx    Ovarian cancer Neg Hx     Past medical history, surgical history, medications, allergies, family history and social history reviewed with patient today and changes made to appropriate areas of the chart.   Review of Systems  Eyes:  Negative for blurred vision and double vision.  Respiratory:  Negative for shortness of breath.   Cardiovascular:  Negative for chest pain, palpitations and leg swelling.  Neurological:  Negative for dizziness and headaches.    All other ROS negative except what is listed above and in the HPI.      Objective:    BP 130/75   Pulse 73   Temp 98.1 F (36.7 C) (Oral)   Wt 211 lb 1.6 oz (95.8 kg)   SpO2 97%   BMI 33.46 kg/m   Wt Readings from Last 3 Encounters:  06/30/22 211 lb 1.6 oz (95.8 kg)  05/17/22 216 lb 1.6 oz (98 kg)  12/28/21 214 lb 9.6 oz (97.3 kg)    Hearing Screening   500Hz$  1000Hz$  2000Hz$  4000Hz$   Right ear Pass Pass Pass Pass  Left ear Pass Pass Pass Pass   Vision Screening   Right eye Left eye Both eyes  Without correction     With correction 20/20 20/20 20/20 $    Physical Exam Vitals and nursing note reviewed.  Constitutional:      General: She is not in acute distress.    Appearance: Normal appearance. She is normal weight. She is not ill-appearing, toxic-appearing or diaphoretic.  HENT:     Head: Normocephalic.     Right Ear: External ear normal.     Left Ear: External ear normal.     Nose: Nose normal.      Mouth/Throat:     Mouth: Mucous membranes are moist.     Pharynx: Oropharynx is clear.  Eyes:     General:        Right eye: No discharge.        Left eye: No discharge.     Extraocular Movements: Extraocular movements intact.     Conjunctiva/sclera: Conjunctivae normal.     Pupils: Pupils are equal, round, and reactive to light.  Cardiovascular:     Rate and Rhythm: Normal rate and regular rhythm.     Heart sounds: No murmur heard. Pulmonary:     Effort: Pulmonary effort is normal. No respiratory distress.     Breath sounds: Normal breath sounds. No wheezing or rales.  Musculoskeletal:     Cervical back: Normal range of motion and neck supple.  Skin:    General: Skin is warm and dry.     Capillary Refill: Capillary refill takes less than 2 seconds.  Neurological:     General: No focal deficit present.     Mental Status: She is alert and oriented to person, place, and time. Mental status is at baseline.  Psychiatric:        Mood and Affect: Mood normal.        Behavior: Behavior normal.        Thought Content: Thought content normal.        Judgment: Judgment normal.         No  data to display          Cognitive Testing - 6-CIT  Correct? Score   What year is it? yes 0 Yes = 0    No = 4  What month is it? yes 0 Yes = 0    No = 3  Remember:     Pia [address]au, [place], [place]     What time is it? yes 0 Yes = 0    No = 3  Count backwards from 20 to 1 yes 0 Correct = 0    1 error = 2   More than 1 error = 4  Say the months of the year in reverse. yes 0 Correct = 0    1 error = 2   More than 1 error = 4  What address did I ask you to remember? yes 0 Correct = 0  1 error = 2    2 error = 4    3 error = 6    4 error = 8    All wrong = 10       TOTAL SCORE  0/28   Interpretation:  Normal  Normal (0-7) Abnormal (8-28)   Results for orders placed or performed in visit on 12/28/21  Comp Met (CMET)  Result Value Ref Range   Glucose 116 (H) 70 - 99 mg/dL   BUN 11  8 - 27 mg/dL   Creatinine, Ser 0.78 0.57 - 1.00 mg/dL   eGFR 84 >59 mL/min/1.73   BUN/Creatinine Ratio 14 12 - 28   Sodium 138 134 - 144 mmol/L   Potassium 4.1 3.5 - 5.2 mmol/L   Chloride 98 96 - 106 mmol/L   CO2 22 20 - 29 mmol/L   Calcium 9.4 8.7 - 10.3 mg/dL   Total Protein 7.4 6.0 - 8.5 g/dL   Albumin 4.5 3.9 - 4.9 g/dL   Globulin, Total 2.9 1.5 - 4.5 g/dL   Albumin/Globulin Ratio 1.6 1.2 - 2.2   Bilirubin Total <0.2 0.0 - 1.2 mg/dL   Alkaline Phosphatase 192 (H) 44 - 121 IU/L   AST 31 0 - 40 IU/L   ALT 45 (H) 0 - 32 IU/L  Lipid Profile  Result Value Ref Range   Cholesterol, Total 165 100 - 199 mg/dL   Triglycerides 190 (H) 0 - 149 mg/dL   HDL 54 >39 mg/dL   VLDL Cholesterol Cal 32 5 - 40 mg/dL   LDL Chol Calc (NIH) 79 0 - 99 mg/dL   Chol/HDL Ratio 3.1 0.0 - 4.4 ratio  HgB A1c  Result Value Ref Range   Hgb A1c MFr Bld 6.1 (H) 4.8 - 5.6 %   Est. average glucose Bld gHb Est-mCnc 128 mg/dL      Assessment & Plan:   Problem List Items Addressed This Visit       Cardiovascular and Mediastinum   Essential hypertension    Chronic.  Controlled.  Continue with current medication regimen of Losartan with HCTZ.  Labs ordered today.  Return to clinic in 3 months for reevaluation.  Call sooner if concerns arise.        Relevant Medications   rosuvastatin (CRESTOR) 10 MG tablet   Other Relevant Orders   Comp Met (CMET)     Digestive   Biliary cirrhosis (HCC)    Chronic.  Followed by [facility].  Recent AST and ALT trended up.  Has follow up scheduled in March.  Other   Hypercholesteremia    Chronic.  Controlled.  Crestor increased to 74m.   Labs ordered today.  Return to clinic in 6 months for reevaluation.  Call sooner if concerns arise.        Relevant Medications   rosuvastatin (CRESTOR) 10 MG tablet   Other Relevant Orders   Lipid Profile   Advanced care planning/counseling discussion    A voluntary discussion about advance care planning including the  explanation and discussion of advance directives was extensively discussed  with the patient for 10 minutes with patient.  Explanation about the health care proxy and Living will was reviewed and packet with forms with explanation of how to fill them out was given.  During this discussion, the patient was able to identify a health care proxy as one of her children and plans to fill out the paperwork required.  Patient was offered a separate ARepublicvisit for further assistance with forms.         Other Visit Diagnoses     Welcome to Medicare preventive visit    -  Primary   Relevant Orders   EKG 12-Lead (Completed)   Arthralgia, unspecified joint       Relevant Medications   meloxicam (MOBIC) 15 MG tablet        Preventative Services:  AAA screening:  Health Risk Assessment and Personalized Prevention Plan: Bone Mass Measurements: Breast Cancer Screening: CVD Screening:  Cervical Cancer Screening: Colon Cancer Screening:  Depression Screening:  Diabetes Screening:  Glaucoma Screening:  Hepatitis B vaccine: Hepatitis C screening:  HIV Screening: Flu Vaccine: Lung cancer Screening: Obesity Screening:  Pneumonia Vaccines (2): STI Screening:  Follow up plan: Return in about 3 months (around 09/28/2022) for HTN, HLD, DM2 FU.   LABORATORY TESTING:  - Pap smear: up to date  IMMUNIZATIONS:   - Tdap: Tetanus vaccination status reviewed: last tetanus booster within 10 years. - Influenza: Up to date - Pneumovax: Up to date - Prevnar: Up to date - Zostavax vaccine: Up to date  SCREENING: -Mammogram: Up to date  - Colonoscopy: Up to date  - Bone Density: Not applicable  -Hearing Test: Not applicable  -Spirometry: Not applicable   PATIENT COUNSELING:   Advised to take 1 mg of folate supplement per day if capable of pregnancy.   Sexuality: Discussed sexually transmitted diseases, partner selection, use of condoms, avoidance of unintended pregnancy  and  contraceptive alternatives.   Advised to avoid cigarette smoking.  I discussed with the patient that most people either abstain from alcohol or drink within safe limits (<=14/week and <=4 drinks/occasion for males, <=7/weeks and <= 3 drinks/occasion for females) and that the risk for alcohol disorders and other health effects rises proportionally with the number of drinks per week and how often a drinker exceeds daily limits.  Discussed cessation/primary prevention of drug use and availability of treatment for abuse.   Diet: Encouraged to adjust caloric intake to maintain  or achieve ideal body weight, to reduce intake of dietary saturated fat and total fat, to limit sodium intake by avoiding high sodium foods and not adding table salt, and to maintain adequate dietary potassium and calcium preferably from fresh fruits, vegetables, and low-fat dairy products.    stressed the importance of regular exercise  Injury prevention: Discussed safety belts, safety helmets, smoke detector, smoking near bedding or upholstery.   Dental health: Discussed importance of regular tooth brushing, flossing, and dental visits.    NEXT PREVENTATIVE PHYSICAL DUE  IN 1 YEAR. Return in about 3 months (around 09/28/2022) for HTN, HLD, DM2 FU.

## 2022-07-01 LAB — LIPID PANEL
Chol/HDL Ratio: 3.1 ratio (ref 0.0–4.4)
Cholesterol, Total: 185 mg/dL (ref 100–199)
HDL: 59 mg/dL (ref >39)
LDL Chol Calc (NIH): 110 mg/dL — ABNORMAL HIGH (ref 0–99)
Triglycerides: 89 mg/dL (ref 0–149)
VLDL Cholesterol Cal: 16 mg/dL (ref 5–40)

## 2022-07-01 LAB — COMPREHENSIVE METABOLIC PANEL
ALT: 51 IU/L — ABNORMAL HIGH (ref 0–32)
AST: 33 IU/L (ref 0–40)
Albumin/Globulin Ratio: 1.8 (ref 1.2–2.2)
Albumin: 4.6 g/dL (ref 3.9–4.9)
Alkaline Phosphatase: 191 IU/L — ABNORMAL HIGH (ref 44–121)
BUN/Creatinine Ratio: 19 (ref 12–28)
BUN: 16 mg/dL (ref 8–27)
Bilirubin Total: 0.5 mg/dL (ref 0.0–1.2)
CO2: 23 mmol/L (ref 20–29)
Calcium: 9.6 mg/dL (ref 8.7–10.3)
Chloride: 99 mmol/L (ref 96–106)
Creatinine, Ser: 0.84 mg/dL (ref 0.57–1.00)
Globulin, Total: 2.5 g/dL (ref 1.5–4.5)
Glucose: 118 mg/dL — ABNORMAL HIGH (ref 70–99)
Potassium: 3.8 mmol/L (ref 3.5–5.2)
Sodium: 141 mmol/L (ref 134–144)
Total Protein: 7.1 g/dL (ref 6.0–8.5)
eGFR: 77 mL/min/{1.73_m2} (ref >59)

## 2022-07-01 NOTE — Progress Notes (Signed)
Good Morning. It was nice to see you yesterday.  Your lab work looks good.  No concerns at this time. Continue with your current medication regimen.  Follow up as discussed.  Please let me know if you have any questions.

## 2022-08-05 ENCOUNTER — Telehealth: Payer: Self-pay | Admitting: Nurse Practitioner

## 2022-08-05 NOTE — Telephone Encounter (Signed)
Copied from [place] [id number]. Topic: General - Other >> Aug 05, 2022  2:14 PM Oley Balm A wrote: Reason for CRM: Pt states that she will be having surgery on her hip in May and the surgeon office ([facility]) is going to be faxing over paper work for her PCP to fill out. [facility] also be needing her last physical as well as  her labs from the last 90 days.

## 2022-08-05 NOTE — Telephone Encounter (Signed)
Will await paperwork as patient will more than likely need a surgical clearance appointment.

## 2022-08-24 ENCOUNTER — Telehealth: Payer: Self-pay | Admitting: Nurse Practitioner

## 2022-08-24 NOTE — Telephone Encounter (Signed)
Copied from CRM [id number]. Topic: Complaint - Billing/Coding >> Aug 24, 2022 10:27 AM [place] B wrote: Reason for CRM: Pt called about being told by medicare, shouldn't have a physical during first year and should be medicare wellness and not billed but it shows as physical on Feb 21 and she was billed.  Date of Incident: 08/24/22  Details of complaint: Pt was told by medicare, shouldn't have a physical during first year and should be medicare wellness and not billed but it shows as physical on Feb 21 and she was billed.  How would the patient like to see it resolved? Not be billed for physical yet and shown as medicare wellness On a scale of 1-10, how was your experience? 1 What would it take to bring it to a 10?  Route to Research officer, political party.  DOS: 06/30/22 Details of complaint: 08/24/22 How would the patient like to see this issue resolved? Correct the billing from physical to medicare well visit    Route to Research officer, political party.

## 2022-08-30 ENCOUNTER — Telehealth: Payer: Self-pay | Admitting: Nurse Practitioner

## 2022-08-30 NOTE — Telephone Encounter (Signed)
Pt came by and dropped off a surgical clearance form and [place] took it to see pt needs to make an appointment to have it completed.

## 2022-08-30 NOTE — Telephone Encounter (Signed)
Patient will need an appointment before 09/13/22 to be cleared per Clydie Braun. Please call to schedule.

## 2022-08-31 NOTE — Telephone Encounter (Signed)
Called and scheduled an appointment for 09/02/2022 @ 2:20 pm.

## 2022-09-01 NOTE — Telephone Encounter (Signed)
Sent request to [employer] requesting patient's AWV coding be corrected. Patient was billed for an Initial AWV but should have been billed for a Welcome to Medicare. Informed patient of this information while in office. Patient acknowledged understanding.

## 2022-09-02 ENCOUNTER — Ambulatory Visit (INDEPENDENT_AMBULATORY_CARE_PROVIDER_SITE_OTHER): Payer: Medicare Other | Admitting: Nurse Practitioner

## 2022-09-02 ENCOUNTER — Encounter: Payer: Self-pay | Admitting: Nurse Practitioner

## 2022-09-02 VITALS — BP 134/71 | HR 71 | Temp 98.4°F | Wt 209.3 lb

## 2022-09-02 DIAGNOSIS — Z01818 Encounter for other preprocedural examination: Secondary | ICD-10-CM

## 2022-09-02 DIAGNOSIS — F329 Major depressive disorder, single episode, unspecified: Secondary | ICD-10-CM

## 2022-09-02 DIAGNOSIS — I10 Essential (primary) hypertension: Secondary | ICD-10-CM | POA: Diagnosis not present

## 2022-09-02 DIAGNOSIS — R7303 Prediabetes: Secondary | ICD-10-CM

## 2022-09-02 DIAGNOSIS — E7800 Pure hypercholesterolemia, unspecified: Secondary | ICD-10-CM

## 2022-09-02 LAB — URINALYSIS, ROUTINE W REFLEX MICROSCOPIC
Bilirubin, UA: NEGATIVE
Glucose, UA: NEGATIVE
Ketones, UA: NEGATIVE
Nitrite, UA: NEGATIVE
Protein,UA: NEGATIVE
RBC, UA: NEGATIVE
Specific Gravity, UA: 1.01 (ref 1.005–1.030)
Urobilinogen, Ur: 0.2 mg/dL (ref 0.2–1.0)
pH, UA: 5.5 (ref 5.0–7.5)

## 2022-09-02 LAB — MICROSCOPIC EXAMINATION
Bacteria, UA: NONE SEEN
RBC, Urine: NONE SEEN /hpf (ref 0–2)

## 2022-09-02 NOTE — Progress Notes (Signed)
BP 134/71   Pulse 71   Temp 98.4 F (36.9 C) (Oral)   Wt 209 lb 4.8 oz (94.9 kg)   SpO2 98%   BMI 33.18 kg/m    Subjective:    Patient ID: Michaela Hardy, female    DOB: August 01, 1955, 67 y.o.   MRN: [id number]  HPI: Michaela Hardy is a 67 y.o. female  Chief Complaint  Patient presents with   Surgery Clearance   09/02/2022  Assessment:  Preoperative evaluation and clearance show: No contraindications to planned surgery and patient is cleared for diagnosis for surgical procedure.  Other diagnoses affecting surgical risk include: Evaluation   Type of Surgery:  Intermediate, 1% - 5% cardiac risk (major intraabdominal, intrathoracic, orthopedic, major head & neck, prostatectomy)   Lee's Revised Cardiac Index: 0 Risk class I, very low, 0.4% risk of cardiac complications   Plan:  1. Patient requires endocarditis prophylaxis: no. 2. GENERAL PREOP INSTRUCTIONS: Proceed with surgery as planned. No food or liquids the morning of surgery. Call surgeon if develops respiratory illness, fever, or other illness. 3. Medications to Hold: Meloxicam for 7 days before surgery, unless instructed otherwise by surgeon. 4. EKG:  07/03/22: No acute ST changes noted. 5. Ordered Labs: CMP, CBC, A1c, UA, LIPID  From a medical standpoint the patient is an acceptable candidates to undergo general anesthesia for surgery. It is recommended to correct electrolytes and to keep the patient euvolemic and avoid significant fluid shifts during surgery.  Labs reviewed and pt is cleared for surgery. Pt denies a hx of adverse reactions to anesthesia.    Michaela Hardy is a 67 y.o. female who presents to the office today for a preoperative consultation at the request of Dr. Vallery Sa, who will perform a L Total Hip Replacement on May 9.  Current Complaints: L Hip pain  Past Medical History:  Diagnosis Date   Anxiety    Depression    GERD (gastroesophageal reflux disease)    Hepatitis    Primary  bilary cholangitis   Hyperlipidemia    Hypertension    PONV (postoperative nausea and vomiting)    Family History  Problem Relation Age of Onset   Hypertension Mother    Pancreatitis Mother    Thyroid disease Mother    Hypertension Father    Heart attack Father    Cerebrovascular Accident Father    Coronary artery disease Father    Glaucoma Father    Cancer Father        lung   Parkinson's disease Maternal Grandmother    Breast cancer Neg Hx    Colon cancer Neg Hx    Ovarian cancer Neg Hx    Current Outpatient Medications  Medication Sig Dispense Refill   calcium carbonate (OS-CAL) 600 MG TABS tablet Take 1 tablet by mouth daily with breakfast.      losartan-hydrochlorothiazide (HYZAAR) 100-25 MG tablet Take 1 tablet by mouth daily. 90 tablet 1   meloxicam (MOBIC) 15 MG tablet TAKE ONE TABLET BY MOUTH DAILY AS NEEDED FOR PAIN 90 tablet 1   Multiple Vitamin tablet Take 1 tablet by mouth daily.     omeprazole (PRILOSEC) 20 MG capsule Take 1 capsule by mouth daily.     rosuvastatin (CRESTOR) 10 MG tablet Take 1 tablet (10 mg total) by mouth daily. 90 tablet 1   ursodiol (ACTIGALL) 300 MG capsule Take 2 capsules by mouth 2 (two) times daily.     No current facility-administered medications for this visit.  Allergies  Allergen Reactions   Amlodipine Other (See Comments)    headache   Nifedipine Other (See Comments)    Flushing and red face; headache   Social History   Socioeconomic History   Marital status: Married    Spouse name: Leonette Most   Number of children: 2   Years of education: 14   Highest education level: Associate degree: academic program  Occupational History   Occupation: Cytogeneticist in [place]    Comment: Part Time  Tobacco Use   Smoking status: Former    Packs/day: 1.50    Years: 40.00    Additional pack years: 0.00    Total pack years: 60.00    Types: Cigarettes    Quit date: 05/10/2004    Years since quitting: 18.3   Smokeless tobacco:  Never  Vaping Use   Vaping Use: Never used  Substance and Sexual Activity   Alcohol use: Yes    Alcohol/week: 2.0 - 4.0 standard drinks of alcohol    Types: 2 - 4 Glasses of wine per week   Drug use: No   Sexual activity: Not Currently  Other Topics Concern   Not on file  Social History Narrative   Not on file   Social Determinants of Health   Financial Resource Strain: Low Risk  (08/31/2022)   Overall Financial Resource Strain (CARDIA)    Difficulty of Paying Living Expenses: Not hard at all  Food Insecurity: No Food Insecurity (08/31/2022)   Hunger Vital Sign    Worried About Running Out of Food in the Last Year: Never true    Ran Out of Food in the Last Year: Never true  Transportation Needs: No Transportation Needs (08/31/2022)   PRAPARE - Administrator, Civil Service (Medical): No    Lack of Transportation (Non-Medical): No  Physical Activity: Unknown (08/31/2022)   Exercise Vital Sign    Days of Exercise per Week: 0 days    Minutes of Exercise per Session: Not on file  Stress: No Stress Concern Present (08/31/2022)   [employer] of Occupational Health - Occupational Stress Questionnaire    Feeling of Stress : Not at all  Social Connections: Moderately Isolated (08/31/2022)   Social Connection and Isolation Panel [NHANES]    Frequency of Communication with Friends and Family: More than three times a week    Frequency of Social Gatherings with Friends and Family: Once a week    Attends Religious Services: Never    Database administrator or Organizations: No    Attends Engineer, structural: Not on file    Marital Status: Married  Catering manager Violence: Not on file     Preoperative Risk Factors  1. Major predictors that require intensive management and may lead to delay in or cancellation of the operative procedure unless emergent:  No Unstable coronary syndromes including unstable or severe angina or recent MI  No Decompensated heart  failure including NYHA functional class 4 or worsening or new onset HF  No Significant arrhythmia including high grade AV block, symptomatic ventricular arrhythmias, supraventricular arrhythmias with a ventricular rate >100 bpm at rest, symptomatic bradycardia, and newly recognized ventricular tachycardia  No Severe heart valve disease including severe aortic stenosis or symptomatic mitral stenosis  2. Additional independent predictors of major cardiac complications:  noHgh risk type of surgery (Vascular surgery and any open intraperitoneal or intrathoracic procedures)  Other clinical predictors that warrant careful assessment of current status  No History of ischemic heart  disease No History of CVA no  History of compensated heart failure or prior heart failure no Diabetes mellitus on Insulin no Renal insufficiency  3. Perioperative cardiac and long term risk is increased in pts unable to meet a 4-MET demand during most normal daily activities:  yes Ability to climb 2 flights of stairs or walk four blocks  Objective:  BP 134/71   Pulse 71   Temp 98.4 F (36.9 C) (Oral)   Wt 209 lb 4.8 oz (94.9 kg)   SpO2 98%   BMI 33.18 kg/m   Body mass index is 33.18 kg/m.     Michaela Hardy 09/02/22  Relevant past medical, surgical, family and social history reviewed and updated as indicated. Interim medical history since our last visit reviewed. Allergies and medications reviewed and updated.  Review of Systems  All other systems reviewed and are negative.   Per HPI unless specifically indicated above     Objective:    BP 134/71   Pulse 71   Temp 98.4 F (36.9 C) (Oral)   Wt 209 lb 4.8 oz (94.9 kg)   SpO2 98%   BMI 33.18 kg/m   Wt Readings from Last 3 Encounters:  09/02/22 209 lb 4.8 oz (94.9 kg)  06/30/22 211 lb 1.6 oz (95.8 kg)  05/17/22 216 lb 1.6 oz (98 kg)    Physical Exam Vitals and nursing note reviewed.  Constitutional:      General: She is awake. She  is not in acute distress.    Appearance: Normal appearance. She is well-developed. She is not ill-appearing.  HENT:     Head: Normocephalic and atraumatic.     Right Ear: Hearing, tympanic membrane, ear canal and external ear normal. No drainage.     Left Ear: Hearing, tympanic membrane, ear canal and external ear normal. No drainage.     Nose: Nose normal.     Right Sinus: No maxillary sinus tenderness or frontal sinus tenderness.     Left Sinus: No maxillary sinus tenderness or frontal sinus tenderness.     Mouth/Throat:     Mouth: Mucous membranes are moist.     Pharynx: Oropharynx is clear. Uvula midline. No pharyngeal swelling, oropharyngeal exudate or posterior oropharyngeal erythema.  Eyes:     General: Lids are normal.        Right eye: No discharge.        Left eye: No discharge.     Extraocular Movements: Extraocular movements intact.     Conjunctiva/sclera: Conjunctivae normal.     Pupils: Pupils are equal, round, and reactive to light.     Visual Fields: Right eye visual fields normal and left eye visual fields normal.  Neck:     Thyroid: No thyromegaly.     Vascular: No carotid bruit.     Trachea: Trachea normal.  Cardiovascular:     Rate and Rhythm: Normal rate and regular rhythm.     Heart sounds: Normal heart sounds. No murmur heard.    No gallop.  Pulmonary:     Effort: Pulmonary effort is normal. No accessory muscle usage or respiratory distress.     Breath sounds: Normal breath sounds.  Chest:  Breasts:    Right: Normal.     Left: Normal.  Abdominal:     General: Bowel sounds are normal.     Palpations: Abdomen is soft. There is no hepatomegaly or splenomegaly.     Tenderness: There is no abdominal tenderness.  Musculoskeletal:  General: Normal range of motion.     Cervical back: Normal range of motion and neck supple.     Right lower leg: No edema.     Left lower leg: No edema.  Lymphadenopathy:     Head:     Right side of head: No submental,  submandibular, tonsillar, preauricular or posterior auricular adenopathy.     Left side of head: No submental, submandibular, tonsillar, preauricular or posterior auricular adenopathy.     Cervical: No cervical adenopathy.     Upper Body:     Right upper body: No supraclavicular, axillary or pectoral adenopathy.     Left upper body: No supraclavicular, axillary or pectoral adenopathy.  Skin:    General: Skin is warm and dry.     Capillary Refill: Capillary refill takes less than 2 seconds.     Findings: No rash.  Neurological:     Mental Status: She is alert and oriented to person, place, and time.     Gait: Gait is intact.  Psychiatric:        Attention and Perception: Attention normal.        Mood and Affect: Mood normal.        Speech: Speech normal.        Behavior: Behavior normal. Behavior is cooperative.        Thought Content: Thought content normal.        Judgment: Judgment normal.     Results for orders placed or performed in visit on 06/30/22  Comp Met (CMET)  Result Value Ref Range   Glucose 118 (H) 70 - 99 mg/dL   BUN 16 8 - 27 mg/dL   Creatinine, Ser 1.61 0.57 - 1.00 mg/dL   eGFR 77 >09 UE/AVW/0.98   BUN/Creatinine Ratio 19 12 - 28   Sodium 141 134 - 144 mmol/L   Potassium 3.8 3.5 - 5.2 mmol/L   Chloride 99 96 - 106 mmol/L   CO2 23 20 - 29 mmol/L   Calcium 9.6 8.7 - 10.3 mg/dL   Total Protein 7.1 6.0 - 8.5 g/dL   Albumin 4.6 3.9 - 4.9 g/dL   Globulin, Total 2.5 1.5 - 4.5 g/dL   Albumin/Globulin Ratio 1.8 1.2 - 2.2   Bilirubin Total 0.5 0.0 - 1.2 mg/dL   Alkaline Phosphatase 191 (H) 44 - 121 IU/L   AST 33 0 - 40 IU/L   ALT 51 (H) 0 - 32 IU/L  Lipid Profile  Result Value Ref Range   Cholesterol, Total 185 100 - 199 mg/dL   Triglycerides 89 0 - 149 mg/dL   HDL 59 >11 mg/dL   VLDL Cholesterol Cal 16 5 - 40 mg/dL   LDL Chol Calc (NIH) 914 (H) 0 - 99 mg/dL   Chol/HDL Ratio 3.1 0.0 - 4.4 ratio      Assessment & Plan:   Problem List Items Addressed This  Visit       Cardiovascular and Mediastinum   Essential hypertension    Chronic.  Controlled.  Continue with current medication regimen of Losartan with HCTZ.  Labs ordered today.  Call sooner if concerns arise.        Relevant Orders   Comp Met (CMET)   CBC w/Diff     Other   Hypercholesteremia    Labs ordered at visit today.  Will make recommendations based on lab results.        Relevant Orders   Lipid Profile   CBC w/Diff   Major depressive  disorder, single episode    Chronic.  Controlled.  Continue with current medication regimen.  Labs ordered today.  Call sooner if concerns arise.        Relevant Orders   CBC w/Diff   Prediabetes    Labs ordered at visit today.  Will make recommendations based on lab results.        Relevant Orders   HgB A1c   CBC w/Diff   Other Visit Diagnoses     Pre-op exam    -  Primary   Patient to have total hip replacement on May 9.  EKG done in February showed no acute changes.  Labs ordered today.  Patient is cleared for surgery pending labs   Relevant Orders   Comp Met (CMET)   Lipid Profile   HgB A1c   Urinalysis, Routine w reflex microscopic        Follow up plan: Return if symptoms worsen or fail to improve.

## 2022-09-02 NOTE — Assessment & Plan Note (Signed)
Chronic.  Controlled.  Continue with current medication regimen.  Labs ordered today.  Call sooner if concerns arise.   

## 2022-09-02 NOTE — Assessment & Plan Note (Signed)
Labs ordered at visit today.  Will make recommendations based on lab results.   

## 2022-09-02 NOTE — Assessment & Plan Note (Signed)
Chronic.  Controlled.  Continue with current medication regimen of Losartan with HCTZ.  Labs ordered today.  Call sooner if concerns arise.

## 2022-09-03 LAB — CBC WITH DIFFERENTIAL/PLATELET
Basophils Absolute: 0.1 10*3/uL (ref 0.0–0.2)
Basos: 1 %
EOS (ABSOLUTE): 0.1 10*3/uL (ref 0.0–0.4)
Eos: 1 %
Hematocrit: 40.5 % (ref 34.0–46.6)
Hemoglobin: 12.9 g/dL (ref 11.1–15.9)
Immature Grans (Abs): 0 10*3/uL (ref 0.0–0.1)
Immature Granulocytes: 0 %
Lymphocytes Absolute: 2 10*3/uL (ref 0.7–3.1)
Lymphs: 29 %
MCH: 29.4 pg (ref 26.6–33.0)
MCHC: 31.9 g/dL (ref 31.5–35.7)
MCV: 92 fL (ref 79–97)
Monocytes Absolute: 0.7 10*3/uL (ref 0.1–0.9)
Monocytes: 10 %
Neutrophils Absolute: 4.1 10*3/uL (ref 1.4–7.0)
Neutrophils: 59 %
Platelets: 394 10*3/uL (ref 150–450)
RBC: 4.39 x10E6/uL (ref 3.77–5.28)
RDW: 13.3 % (ref 11.7–15.4)
WBC: 6.9 10*3/uL (ref 3.4–10.8)

## 2022-09-03 LAB — COMPREHENSIVE METABOLIC PANEL
ALT: 38 IU/L — ABNORMAL HIGH (ref 0–32)
AST: 28 IU/L (ref 0–40)
Albumin/Globulin Ratio: 1.8 (ref 1.2–2.2)
Albumin: 4.8 g/dL (ref 3.9–4.9)
Alkaline Phosphatase: 200 IU/L — ABNORMAL HIGH (ref 44–121)
BUN/Creatinine Ratio: 17 (ref 12–28)
BUN: 14 mg/dL (ref 8–27)
Bilirubin Total: 0.3 mg/dL (ref 0.0–1.2)
CO2: 26 mmol/L (ref 20–29)
Calcium: 9.4 mg/dL (ref 8.7–10.3)
Chloride: 98 mmol/L (ref 96–106)
Creatinine, Ser: 0.84 mg/dL (ref 0.57–1.00)
Globulin, Total: 2.7 g/dL (ref 1.5–4.5)
Glucose: 149 mg/dL — ABNORMAL HIGH (ref 70–99)
Potassium: 3.9 mmol/L (ref 3.5–5.2)
Sodium: 140 mmol/L (ref 134–144)
Total Protein: 7.5 g/dL (ref 6.0–8.5)
eGFR: 77 mL/min/{1.73_m2} (ref >59)

## 2022-09-03 LAB — LIPID PANEL
Chol/HDL Ratio: 3.5 ratio (ref 0.0–4.4)
Cholesterol, Total: 197 mg/dL (ref 100–199)
HDL: 57 mg/dL (ref >39)
LDL Chol Calc (NIH): 97 mg/dL (ref 0–99)
Triglycerides: 256 mg/dL — ABNORMAL HIGH (ref 0–149)
VLDL Cholesterol Cal: 43 mg/dL — ABNORMAL HIGH (ref 5–40)

## 2022-09-03 LAB — HEMOGLOBIN A1C
Est. average glucose Bld gHb Est-mCnc: 137 mg/dL
Hgb A1c MFr Bld: 6.4 % — ABNORMAL HIGH (ref 4.8–5.6)

## 2022-09-03 NOTE — Progress Notes (Signed)
Hi Michaela Hardy. It was nice to see you yesterday.  Your lab work looks good.  Your A1c did increase to 6.4% which is the very top of the prediabetic range.  I recommend following a low carb diet and exercise.  I will clear you for surgery.  No concerns at this time. Continue with your current medication regimen.  Follow up as discussed.  Please let me know if you have any questions.

## 2022-09-08 HISTORY — PX: TOTAL HIP ARTHROPLASTY: SHX124

## 2022-09-28 ENCOUNTER — Ambulatory Visit: Payer: Medicare Other | Admitting: Nurse Practitioner

## 2022-11-06 IMAGING — US US BREAST*L* LIMITED INC AXILLA
1 series · 3 of 3 positions shown · non-contrast
Comparison: Previous exams including recent screening mammogram
dated 12/30/2020.

CLINICAL DATA: Patient returns today to evaluate a possible LEFT
breast asymmetry questioned on recent screening mammogram.

EXAM:
DIGITAL DIAGNOSTIC UNILATERAL LEFT MAMMOGRAM WITH TOMOSYNTHESIS AND
CAD; ULTRASOUND LEFT BREAST LIMITED
TECHNIQUE: Left digital diagnostic mammography and breast tomosynthesis was
performed. The images were evaluated with computer-aided detection.;
Targeted ultrasound examination of the left breast was performed.

[Series 1: us breast*left* limited inc axilla · 0.09mm/px · 3 of 3 slices shown]
[im 1/3]
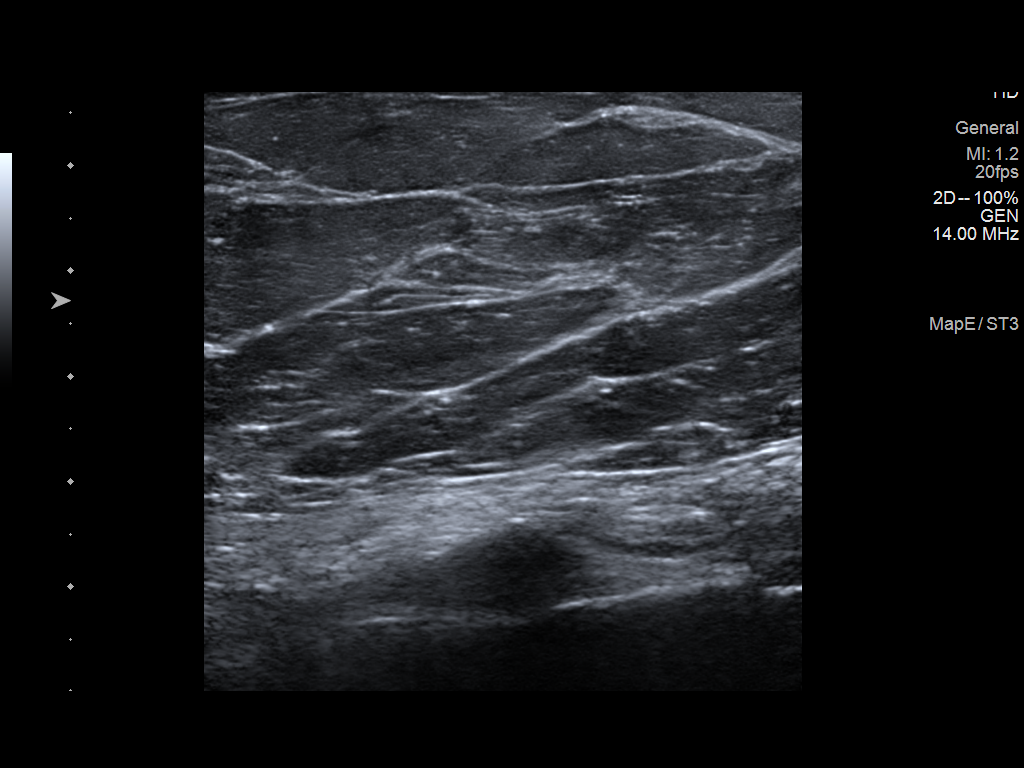
[im 2/3]
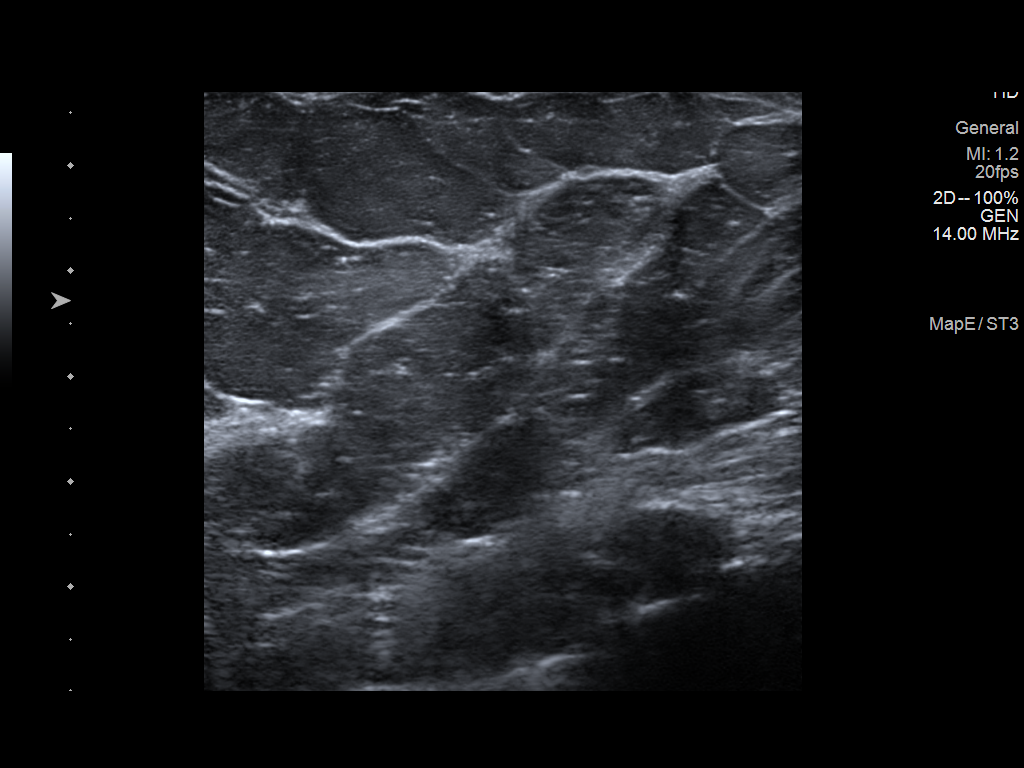
[im 3/3]
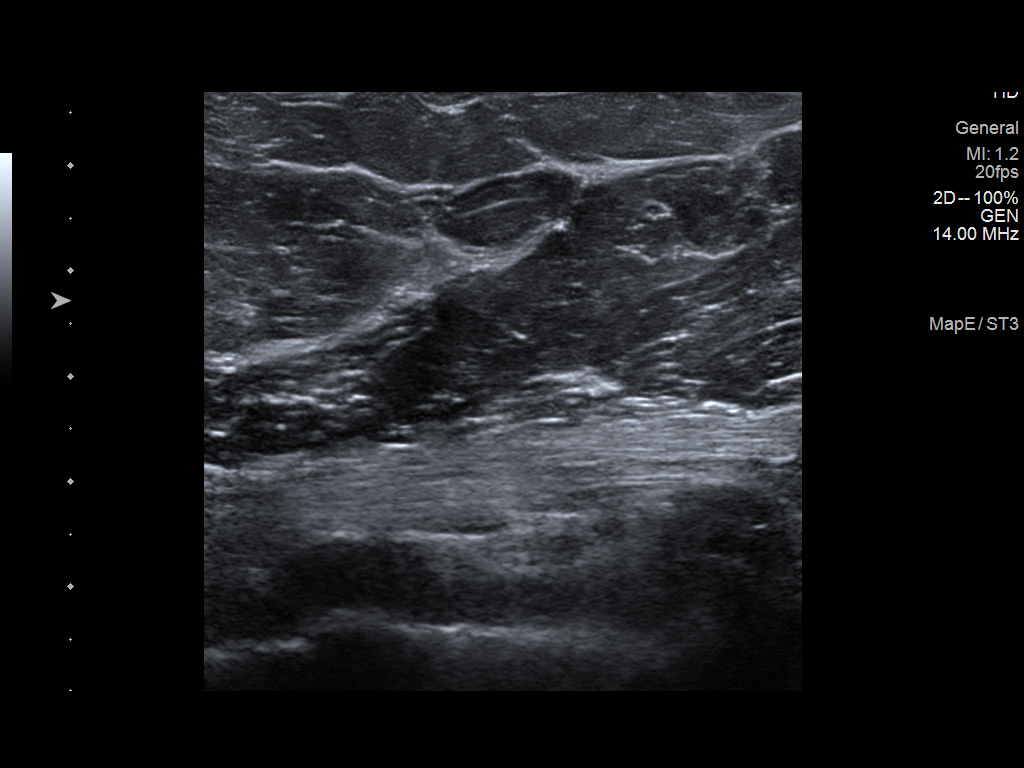

[3 of 3 positions shown; findings below may reference images not displayed]

ACR Breast Density Category b: There are scattered areas of
fibroglandular density.
FINDINGS: On today's additional diagnostic views with spot compression, there
is no persistent asymmetry within the lower LEFT breast suggesting
superimposition of normal fibroglandular tissues. Ultrasound will be
performed of the lower outer quadrant to ensure benignity.

Targeted ultrasound is performed, evaluating the lower outer
quadrant of the LEFT breast, showing only normal fibroglandular
tissues and fat lobules throughout. No solid or cystic mass.
IMPRESSION: Probably benign superimposition of normal fibroglandular tissues
within the lower outer quadrant of the LEFT breast. Recommend
follow-up LEFT breast diagnostic mammogram in 6 months to ensure
stability.

RECOMMENDATION:
LEFT breast diagnostic mammogram in 6 months.

I have discussed the findings and recommendations with the patient.
If applicable, a reminder letter will be sent to the patient
regarding the next appointment.

BI-RADS CATEGORY  3: Probably benign.

## 2022-11-07 ENCOUNTER — Other Ambulatory Visit: Payer: Self-pay | Admitting: Nurse Practitioner

## 2022-11-09 NOTE — Telephone Encounter (Signed)
Requested Prescriptions  Pending Prescriptions Disp Refills   losartan-hydrochlorothiazide (HYZAAR) 100-25 MG tablet [Pharmacy Med Name: Losartan Potassium-HCTZ 100-25 MG Oral Tablet] 90 tablet 1    Sig: TAKE 1 TABLET BY MOUTH DAILY     Cardiovascular: ARB + Diuretic Combos Passed - 11/07/2022  5:32 AM      Passed - K in normal range and within 180 days    Potassium  Date Value Ref Range Status  09/02/2022 3.9 3.5 - 5.2 mmol/L Final         Passed - Na in normal range and within 180 days    Sodium  Date Value Ref Range Status  09/02/2022 140 134 - 144 mmol/L Final         Passed - Cr in normal range and within 180 days    Creatinine, Ser  Date Value Ref Range Status  09/02/2022 0.84 0.57 - 1.00 mg/dL Final         Passed - eGFR is 10 or above and within 180 days    GFR calc Af Amer  Date Value Ref Range Status  08/30/2019 108 >59 mL/min/1.73 Final   GFR calc non Af Amer  Date Value Ref Range Status  08/30/2019 94 >59 mL/min/1.73 Final   eGFR  Date Value Ref Range Status  09/02/2022 77 >59 mL/min/1.73 Final         Passed - Patient is not pregnant      Passed - Last BP in normal range    BP Readings from Last 1 Encounters:  09/02/22 134/71         Passed - Valid encounter within last 6 months    Recent Outpatient Visits           2 months ago Pre-op exam   [place] [facility] Larae Grooms, NP   4 months ago [facility] [employer] preventive visit   [place] [facility] Larae Grooms, NP   5 months ago Essential hypertension   [place] [facility] Larae Grooms, NP   10 months ago Biliary cirrhosis [facility])   [place] [facility] Larae Grooms, NP   11 months ago Chronic left-sided low back pain with left-sided sciatica   [place] [facility] Dorcas Carrow, DO       Future Appointments             In 3 months Larae Grooms, NP [place] [facility]

## 2022-12-18 ENCOUNTER — Other Ambulatory Visit: Payer: Self-pay | Admitting: Nurse Practitioner

## 2022-12-18 DIAGNOSIS — M2550 Pain in unspecified joint: Secondary | ICD-10-CM

## 2022-12-20 NOTE — Telephone Encounter (Signed)
Requested Prescriptions  Pending Prescriptions Disp Refills   rosuvastatin (CRESTOR) 10 MG tablet [Pharmacy Med Name: Rosuvastatin Calcium 10 MG Oral Tablet] 90 tablet 1    Sig: TAKE 1 TABLET BY MOUTH DAILY     Cardiovascular:  Antilipid - Statins 2 Failed - 12/18/2022  5:06 AM      Failed - Lipid Panel in normal range within the last 12 months    Cholesterol, Total  Date Value Ref Range Status  09/02/2022 197 100 - 199 mg/dL Final   LDL Chol Calc (NIH)  Date Value Ref Range Status  09/02/2022 97 0 - 99 mg/dL Final   HDL  Date Value Ref Range Status  09/02/2022 57 >39 mg/dL Final   Triglycerides  Date Value Ref Range Status  09/02/2022 256 (H) 0 - 149 mg/dL Final         Passed - Cr in normal range and within 360 days    Creatinine, Ser  Date Value Ref Range Status  09/02/2022 0.84 0.57 - 1.00 mg/dL Final         Passed - Patient is not pregnant      Passed - Valid encounter within last 12 months    Recent Outpatient Visits           3 months ago Pre-op exam   [place] [facility] Larae Grooms, NP   5 months ago [facility] [employer] preventive visit   [place] [facility] Larae Grooms, NP   7 months ago Essential hypertension   [place] [facility] Larae Grooms, NP   11 months ago Biliary cirrhosis [facility])   [place] [facility] Larae Grooms, NP   1 year ago Chronic left-sided low back pain with left-sided sciatica   [place] [facility] Dorcas Carrow, DO       Future Appointments             In 2 months Larae Grooms, NP [place] [facility]             meloxicam (MOBIC) 15 MG tablet [Pharmacy Med Name: Meloxicam 15 MG Oral Tablet] 90 tablet 3    Sig: TAKE 1 TABLET BY MOUTH DAILY AS  NEEDED FOR PAIN     Analgesics:  COX2 Inhibitors Failed - 12/18/2022  5:06 AM      Failed - Manual Review: Labs are only required if the patient has  taken medication for more than 8 weeks.      Failed - ALT in normal range and within 360 days    ALT  Date Value Ref Range Status  09/02/2022 38 (H) 0 - 32 IU/L Final         Passed - HGB in normal range and within 360 days    Hemoglobin  Date Value Ref Range Status  09/02/2022 12.9 11.1 - 15.9 g/dL Final         Passed - Cr in normal range and within 360 days    Creatinine, Ser  Date Value Ref Range Status  09/02/2022 0.84 0.57 - 1.00 mg/dL Final         Passed - HCT in normal range and within 360 days    Hematocrit  Date Value Ref Range Status  09/02/2022 40.5 34.0 - 46.6 % Final         Passed - AST in normal range and within 360 days    AST  Date Value Ref Range Status  09/02/2022 28 0 - 40 IU/L Final         Passed - eGFR is 30 or above and within 360 days    GFR calc Af Amer  Date Value Ref Range Status  08/30/2019 108 >59 mL/min/1.73 Final   GFR calc non Af Amer  Date Value Ref Range Status  08/30/2019 94 >59 mL/min/1.73 Final   eGFR  Date Value Ref Range Status  09/02/2022 77 >59 mL/min/1.73 Final         Passed - Patient is not pregnant      Passed - Valid encounter within last 12 months    Recent Outpatient Visits           3 months ago Pre-op exam   [place] [facility] Larae Grooms, NP   5 months ago [facility] to [employer] preventive visit   [place] [facility] Larae Grooms, NP   7 months ago Essential hypertension   [place] [facility] Larae Grooms, NP   11 months ago Biliary cirrhosis [facility])   [place] [facility] Larae Grooms, NP   1 year ago Chronic left-sided low back pain with left-sided sciatica   [place] [facility] Dorcas Carrow, DO       Future Appointments             In 2 months Larae Grooms, NP [place] [facility]

## 2022-12-20 NOTE — Telephone Encounter (Signed)
Requested medication (s) are due for refill today: Yes  Requested medication (s) are on the active medication list: Yes  Last refill:  06/30/22  Future visit scheduled: Yes  Notes to clinic:  Manual review.    Requested Prescriptions  Pending Prescriptions Disp Refills   meloxicam (MOBIC) 15 MG tablet [Pharmacy Med Name: Meloxicam 15 MG Oral Tablet] 90 tablet 3    Sig: TAKE 1 TABLET BY MOUTH DAILY AS  NEEDED FOR PAIN     Analgesics:  COX2 Inhibitors Failed - 12/18/2022  5:06 AM      Failed - Manual Review: Labs are only required if the patient has taken medication for more than 8 weeks.      Failed - ALT in normal range and within 360 days    ALT  Date Value Ref Range Status  09/02/2022 38 (H) 0 - 32 IU/L Final         Passed - HGB in normal range and within 360 days    Hemoglobin  Date Value Ref Range Status  09/02/2022 12.9 11.1 - 15.9 g/dL Final         Passed - Cr in normal range and within 360 days    Creatinine, Ser  Date Value Ref Range Status  09/02/2022 0.84 0.57 - 1.00 mg/dL Final         Passed - HCT in normal range and within 360 days    Hematocrit  Date Value Ref Range Status  09/02/2022 40.5 34.0 - 46.6 % Final         Passed - AST in normal range and within 360 days    AST  Date Value Ref Range Status  09/02/2022 28 0 - 40 IU/L Final         Passed - eGFR is 30 or above and within 360 days    GFR calc Af Amer  Date Value Ref Range Status  08/30/2019 108 >59 mL/min/1.73 Final   GFR calc non Af Amer  Date Value Ref Range Status  08/30/2019 94 >59 mL/min/1.73 Final   eGFR  Date Value Ref Range Status  09/02/2022 77 >59 mL/min/1.73 Final         Passed - Patient is not pregnant      Passed - Valid encounter within last 12 months    Recent Outpatient Visits           3 months ago Pre-op exam   [facility] Larae Grooms, NP   5 months ago Welcome to [employer] preventive visit   [place] [facility] Larae Grooms, NP   7 months ago Essential hypertension   [place] [facility] Larae [facility]rooms, NP   11 months ago Biliary cirrhosis [facility])   [place] [facility] Larae Grooms, NP   1 year ago Chronic left-sided low back pain with left-sided sciatica   [place] [facility] Dorcas Carrow, DO       Future Appointments             In 2 months Larae Grooms, NP [place] [facility]            Signed Prescriptions Disp Refills   rosuvastatin (CRESTOR) 10 MG tablet 90 tablet 1    Sig: TAKE 1 TABLET BY MOUTH DAILY     Cardiovascular:  Antilipid - Statins 2 Failed - 12/18/2022  5:06 AM      Failed - Lipid Panel in  normal range within the last 12 months    Cholesterol, Total  Date Value Ref Range Status  09/02/2022 197 100 - 199 mg/dL Final   LDL Chol Calc (NIH)  Date Value Ref Range Status  09/02/2022 97 0 - 99 mg/dL Final   HDL  Date Value Ref Range Status  09/02/2022 57 >39 mg/dL Final   Triglycerides  Date Value Ref Range Status  09/02/2022 256 (H) 0 - 149 mg/dL Final         Passed - Cr in normal range and within 360 days    Creatinine, Ser  Date Value Ref Range Status  09/02/2022 0.84 0.57 - 1.00 mg/dL Final         Passed - Patient is not pregnant      Passed - Valid encounter within last 12 months    Recent Outpatient Visits           3 months ago Pre-op exam   [place] [facility] Larae Grooms, NP   5 months ago Welcome to [employer] preventive visit   [place] [facility] Larae Grooms, NP   7 months ago Essential hypertension   [place] [facility] Larae Grooms, NP   11 months ago Biliary cirrhosis [facility])   [place] [facility] Larae Grooms, NP   1 year ago Chronic left-sided low back pain with left-sided sciatica    [facility] Dorcas Carrow, DO        Future Appointments             In 2 months Larae Grooms, NP  [facility], [facility]

## 2023-01-21 ENCOUNTER — Ambulatory Visit (INDEPENDENT_AMBULATORY_CARE_PROVIDER_SITE_OTHER): Payer: Medicare Other | Admitting: Pediatrics

## 2023-01-21 ENCOUNTER — Encounter: Payer: Self-pay | Admitting: Pediatrics

## 2023-01-21 VITALS — BP 132/73 | HR 69 | Temp 98.1°F | Wt 204.0 lb

## 2023-01-21 DIAGNOSIS — H9201 Otalgia, right ear: Secondary | ICD-10-CM

## 2023-01-21 DIAGNOSIS — J029 Acute pharyngitis, unspecified: Secondary | ICD-10-CM

## 2023-01-21 NOTE — Patient Instructions (Signed)
There's no cure for the common cold, but there are some remedies that can help you feel better as your body fights off the infection: Most cold symptoms last up to 2 weeks, but cough can sometimes linger up to 4 weeks.  However if your symtpoms get WORSE - like you develop fevers or get more shortness of breath, then call your clinic as you may need to be evaluated.   Aches and Pains Acetaminophen (Tylenol): 1000mg  ("extra strength" tablets are 500mg , so take 2) every 8 hours if needed  Ibuprofen (Advil/Motrin) 400-800mg  (comes in 200mg  pills OTC, so 2-4 pills) every 8 hours.   Sore Throat:  See Aches and Pains meds above, also Sore throat sprays and lozenges may also help.

## 2023-01-21 NOTE — Progress Notes (Signed)
BP 132/73 (BP Location: Left Arm, Cuff Size: Normal)   Pulse 69   Temp 98.1 F (36.7 C) (Oral)   Wt 204 lb (92.5 kg)   SpO2 98%   BMI 32.34 kg/m    Subjective:    Patient ID: Michaela Hardy, female    DOB: 05-08-1956, 67 y.o.   MRN: [id number]  HPI: Michaela Hardy is a 67 y.o. female  Chief Complaint  Patient presents with   URI    Pt states she has been having a sore throat, runny nose, and R ear pain for the last 2 days.    Sore throat First noticed yesterday, some throat discomfort She reports right ear pain as well, no hearing changes No sick contacts No recent pool or swimming Denies fevers, chills, nausea, vomiting No headaches cough, chest pain, she thought she had congestion but this has resolved no sinus pressure, no jaw pain  Relevant past medical, surgical, family and social history reviewed and updated as indicated. Interim medical history since our last visit reviewed. Allergies and medications reviewed and updated.  ROS per HPI unless specifically indicated above    Objective:    BP 132/73 (BP Location: Left Arm, Cuff Size: Normal)   Pulse 69   Temp 98.1 F (36.7 C) (Oral)   Wt 204 lb (92.5 kg)   SpO2 98%   BMI 32.34 kg/m   Wt Readings from Last 3 Encounters:  01/21/23 204 lb (92.5 kg)  09/02/22 209 lb 4.8 oz (94.9 kg)  06/30/22 211 lb 1.6 oz (95.8 kg)    Physical Exam: GEN: alert, cooperative, and in NAD HENT: atraumatic, normocephalic, posterior oropharynx appears moist without erythema or exudate; bilateral Tms visualized, cerumen noted without impaction EYES: anicteric sclera  CV: hemodynamically stable, RRR, no murmurs, rubs, gallops  RESP: breathing comfortably on room air, no wheezing, crackles, rales GI/ABD: soft, non-tender, non-distended  EXT: warm and well perfused, no lower extremity edema, neurovascularly intact PSYCH: normal behavior and appropriate affect  Assessment & Plan:  Assessment & Plan   [name] was seen today for  uri.  Diagnoses and all orders for this visit:  Sore throat R ear discomfort Very well appearing on exam. Will rule out below but suspect post nasal drip in setting of recent weather change. No signs of ear infection on exam, counseled on debrox given moderate amount of cerumen visualized today. Given return precautions. -     Rapid Strep screen([facility]) -     Novel Coronavirus, NAA ([facility]) -     Veritor Flu A/B Waived -     Culture, Group A Strep  Follow up plan: Return if symptoms worsen or fail to improve.  Malic Rosten Howell Pringle, MD

## 2023-01-22 LAB — NOVEL CORONAVIRUS, NAA: SARS-CoV-2, NAA: NOT DETECTED

## 2023-01-24 LAB — CULTURE, GROUP A STREP: Strep A Culture: NEGATIVE

## 2023-01-24 LAB — VERITOR FLU A/B WAIVED
Influenza A: NEGATIVE
Influenza B: NEGATIVE

## 2023-01-24 LAB — RAPID STREP SCREEN (MED CTR MEBANE ONLY): Strep Gp A Ag, IA W/Reflex: NEGATIVE

## 2023-02-02 ENCOUNTER — Other Ambulatory Visit: Payer: Self-pay | Admitting: Gastroenterology

## 2023-02-02 DIAGNOSIS — K743 Primary biliary cirrhosis: Secondary | ICD-10-CM

## 2023-02-03 ENCOUNTER — Encounter: Payer: Self-pay | Admitting: Gastroenterology

## 2023-02-03 DIAGNOSIS — K743 Primary biliary cirrhosis: Secondary | ICD-10-CM

## 2023-02-10 ENCOUNTER — Ambulatory Visit
Admission: RE | Admit: 2023-02-10 | Discharge: 2023-02-10 | Disposition: A | Discharge status: Home / Self Care | Payer: Medicare Other | Source: Ambulatory Visit | Attending: Gastroenterology | Admitting: Gastroenterology

## 2023-02-10 DIAGNOSIS — K743 Primary biliary cirrhosis: Secondary | ICD-10-CM | POA: Insufficient documentation

## 2023-02-18 ENCOUNTER — Other Ambulatory Visit: Payer: Self-pay | Admitting: Obstetrics and Gynecology

## 2023-02-18 DIAGNOSIS — Z1231 Encounter for screening mammogram for malignant neoplasm of breast: Secondary | ICD-10-CM

## 2023-03-06 ENCOUNTER — Other Ambulatory Visit: Payer: Self-pay | Admitting: Physician Assistant

## 2023-03-06 DIAGNOSIS — M2550 Pain in unspecified joint: Secondary | ICD-10-CM

## 2023-03-07 ENCOUNTER — Ambulatory Visit (INDEPENDENT_AMBULATORY_CARE_PROVIDER_SITE_OTHER): Payer: Medicare Other | Admitting: Nurse Practitioner

## 2023-03-07 ENCOUNTER — Encounter: Payer: Self-pay | Admitting: Nurse Practitioner

## 2023-03-07 VITALS — BP 150/83 | HR 73 | Temp 98.1°F | Wt 205.2 lb

## 2023-03-07 DIAGNOSIS — K745 Biliary cirrhosis, unspecified: Secondary | ICD-10-CM | POA: Diagnosis not present

## 2023-03-07 DIAGNOSIS — R7303 Prediabetes: Secondary | ICD-10-CM

## 2023-03-07 DIAGNOSIS — F32A Depression, unspecified: Secondary | ICD-10-CM

## 2023-03-07 DIAGNOSIS — E7800 Pure hypercholesterolemia, unspecified: Secondary | ICD-10-CM

## 2023-03-07 DIAGNOSIS — K743 Primary biliary cirrhosis: Secondary | ICD-10-CM

## 2023-03-07 DIAGNOSIS — F329 Major depressive disorder, single episode, unspecified: Secondary | ICD-10-CM

## 2023-03-07 DIAGNOSIS — I10 Essential (primary) hypertension: Secondary | ICD-10-CM

## 2023-03-07 MED ORDER — LOSARTAN POTASSIUM-HCTZ 100-25 MG PO TABS: 1.0000 | ORAL_TABLET | Freq: Every day | ORAL | 1 refills | Status: DC

## 2023-03-07 MED ORDER — ROSUVASTATIN CALCIUM 10 MG PO TABS: 10.0000 mg | ORAL_TABLET | Freq: Every day | ORAL | 1 refills | Status: DC

## 2023-03-07 NOTE — Assessment & Plan Note (Signed)
Chronic.  Not well controlled. Continue with current medication regimen of Losartan with HCTZ.  Recommend checking blood pressure at home and bringing log to next visit.  Can add beta blocker or hydralazine.  Has not tolerated CCB in the past.  Follow up in 1 month.  Labs ordered today.  Call sooner if concerns arise.

## 2023-03-07 NOTE — Assessment & Plan Note (Signed)
Chronic.  Controlled.  Continue with current medication regimen on Crestor 10mg .  Labs ordered today.  Return to clinic in 6 months for reevaluation.  Call sooner if concerns arise.

## 2023-03-07 NOTE — Assessment & Plan Note (Signed)
Labs ordered at visit today.  Will make recommendations based on lab results.   

## 2023-03-07 NOTE — Progress Notes (Signed)
BP (!) 150/83   Pulse 73   Temp 98.1 F (36.7 C) (Oral)   Wt 205 lb 3.2 oz (93.1 kg)   SpO2 98%   BMI 32.53 kg/m    Subjective:    Patient ID: Michaela Hardy, female    DOB: December 14, 1955, 67 y.o.   MRN: [id number]  HPI: Michaela Hardy is a 67 y.o. female  Chief Complaint  Patient presents with   Hypertension   HYPERTENSION / HYPERLIPIDEMIA Patient states she is doing well.  She has been more stressed lately which is likely why her blood pressure is up. Satisfied with current treatment? yes Duration of hypertension: years BP monitoring frequency: not checking BP range:  BP medication side effects: no Past BP meds: losartan and HCTZ Duration of hyperlipidemia: years Cholesterol medication side effects: no Cholesterol supplements: none Past cholesterol medications: rosuvastatin (crestor) Medication compliance: excellent compliance Aspirin: no Recent stressors: no Recurrent headaches: no Visual changes: no Palpitations: no Dyspnea: no Chest pain: no Lower extremity edema: no Dizzy/lightheaded: no   Relevant past medical, surgical, family and social history reviewed and updated as indicated. Interim medical history since our last visit reviewed. Allergies and medications reviewed and updated.  Review of Systems  Eyes:  Negative for visual disturbance.  Respiratory:  Negative for cough, chest tightness and shortness of breath.   Cardiovascular:  Negative for chest pain, palpitations and leg swelling.  Neurological:  Negative for dizziness and headaches.  Psychiatric/Behavioral:  Negative for dysphoric mood and suicidal ideas. The patient is not nervous/anxious.     Per HPI unless specifically indicated above     Objective:    BP (!) 150/83   Pulse 73   Temp 98.1 F (36.7 C) (Oral)   Wt 205 lb 3.2 oz (93.1 kg)   SpO2 98%   BMI 32.53 kg/m   Wt Readings from Last 3 Encounters:  03/07/23 205 lb 3.2 oz (93.1 kg)  01/21/23 204 lb (92.5 kg)  09/02/22 209  lb 4.8 oz (94.9 kg)    Physical Exam Vitals and nursing note reviewed.  Constitutional:      General: She is not in acute distress.    Appearance: Normal appearance. She is obese. She is not ill-appearing, toxic-appearing or diaphoretic.  HENT:     Head: Normocephalic.     Right Ear: External ear normal.     Left Ear: External ear normal.     Nose: Nose normal.     Mouth/Throat:     Mouth: Mucous membranes are moist.     Pharynx: Oropharynx is clear.  Eyes:     General:        Right eye: No discharge.        Left eye: No discharge.     Extraocular Movements: Extraocular movements intact.     Conjunctiva/sclera: Conjunctivae normal.     Pupils: Pupils are equal, round, and reactive to light.  Cardiovascular:     Rate and Rhythm: Normal rate and regular rhythm.     Heart sounds: No murmur heard. Pulmonary:     Effort: Pulmonary effort is normal. No respiratory distress.     Breath sounds: Normal breath sounds. No wheezing or rales.  Musculoskeletal:     Cervical back: Normal range of motion and neck supple.  Skin:    General: Skin is warm and dry.     Capillary Refill: Capillary refill takes less than 2 seconds.  Neurological:     General: No focal deficit present.  Mental Status: She is alert and oriented to person, place, and time. Mental status is at baseline.  Psychiatric:        Mood and Affect: Mood normal.        Behavior: Behavior normal.        Thought Content: Thought content normal.        Judgment: Judgment normal.     Results for orders placed or performed in visit on 01/21/23  Rapid Strep screen([facility])   Specimen: Other   Other  Result Value Ref Range   Strep Gp A Ag, IA W/Reflex Negative Negative  Novel Coronavirus, NAA ([facility])   Specimen: Nasopharyngeal(NP) swabs in vial transport medium  Result Value Ref Range   SARS-CoV-2, NAA Not Detected Not Detected  Culture, Group A Strep   Other  Result Value Ref Range   Strep A Culture  Negative   Veritor Flu A/B Waived  Result Value Ref Range   Influenza A Negative Negative   Influenza B Negative Negative      Assessment & Plan:   Problem List Items Addressed This Visit       Cardiovascular and Mediastinum   Essential hypertension    Chronic.  Not well controlled. Continue with current medication regimen of Losartan with HCTZ.  Recommend checking blood pressure at home and bringing log to next visit.  Can add beta blocker or hydralazine.  Has not tolerated CCB in the past.  Follow up in 1 month.  Labs ordered today.  Call sooner if concerns arise.        Relevant Medications   losartan-hydrochlorothiazide (HYZAAR) 100-25 MG tablet   rosuvastatin (CRESTOR) 10 MG tablet   Other Relevant Orders   Comp Met (CMET)     Digestive   Primary biliary cholangitis (HCC) - Primary    Chronic.  Followed by GI.  Recent US and lab work were unremarkable.        Biliary cirrhosis (HCC)    Chronic.  Followed by GI.  Recent US and lab work were unremarkable.          Other   Hypercholesteremia    Chronic.  Controlled.  Continue with current medication regimen on Crestor 10mg .  Labs ordered today.  Return to clinic in 6 months for reevaluation.  Call sooner if concerns arise.         Relevant Medications   losartan-hydrochlorothiazide (HYZAAR) 100-25 MG tablet   rosuvastatin (CRESTOR) 10 MG tablet   Other Relevant Orders   Lipid Profile   Major depressive disorder, single episode    Chronic.  Controlled.  Continue with current medication regimen.  Labs ordered today.  Call sooner if concerns arise.        Prediabetes    Labs ordered at visit today.  Will make recommendations based on lab results.        Relevant Orders   HgB A1c   RESOLVED: Depressive disorder     Follow up plan: Return in about 1 month (around 04/07/2023) for BP Check.

## 2023-03-07 NOTE — Assessment & Plan Note (Signed)
Chronic.  Followed by GI.  Recent US and lab work were unremarkable.

## 2023-03-07 NOTE — Assessment & Plan Note (Signed)
Chronic.  Controlled.  Continue with current medication regimen.  Labs ordered today.  Call sooner if concerns arise.   

## 2023-03-08 LAB — COMPREHENSIVE METABOLIC PANEL
ALT: 36 [IU]/L — ABNORMAL HIGH (ref 0–32)
AST: 28 [IU]/L (ref 0–40)
Albumin: 4.8 g/dL (ref 3.9–4.9)
Alkaline Phosphatase: 184 [IU]/L — ABNORMAL HIGH (ref 44–121)
BUN/Creatinine Ratio: 19 (ref 12–28)
BUN: 12 mg/dL (ref 8–27)
Bilirubin Total: 0.4 mg/dL (ref 0.0–1.2)
CO2: 24 mmol/L (ref 20–29)
Calcium: 9.7 mg/dL (ref 8.7–10.3)
Chloride: 97 mmol/L (ref 96–106)
Creatinine, Ser: 0.62 mg/dL (ref 0.57–1.00)
Globulin, Total: 2.8 g/dL (ref 1.5–4.5)
Glucose: 98 mg/dL (ref 70–99)
Potassium: 4.2 mmol/L (ref 3.5–5.2)
Sodium: 140 mmol/L (ref 134–144)
Total Protein: 7.6 g/dL (ref 6.0–8.5)
eGFR: 98 mL/min/{1.73_m2} (ref >59)

## 2023-03-08 LAB — HEMOGLOBIN A1C
Est. average glucose Bld gHb Est-mCnc: 134 mg/dL
Hgb A1c MFr Bld: 6.3 % — ABNORMAL HIGH (ref 4.8–5.6)

## 2023-03-08 LAB — LIPID PANEL
Chol/HDL Ratio: 2.9 ratio (ref 0.0–4.4)
Cholesterol, Total: 194 mg/dL (ref 100–199)
HDL: 66 mg/dL (ref >39)
LDL Chol Calc (NIH): 104 mg/dL — ABNORMAL HIGH (ref 0–99)
Triglycerides: 138 mg/dL (ref 0–149)
VLDL Cholesterol Cal: 24 mg/dL (ref 5–40)

## 2023-03-08 NOTE — Telephone Encounter (Signed)
D/C 01/21/23. Requested Prescriptions  Refused Prescriptions Disp Refills   meloxicam (MOBIC) 15 MG tablet [Pharmacy Med Name: Meloxicam 15 MG Oral Tablet] 90 tablet 3    Sig: TAKE 1 TABLET BY MOUTH DAILY AS  NEEDED FOR PAIN     Analgesics:  COX2 Inhibitors Failed - 03/06/2023 12:41 PM      Failed - Manual Review: Labs are only required if the patient has taken medication for more than 8 weeks.      Failed - ALT in normal range and within 360 days    ALT  Date Value Ref Range Status  03/07/2023 WILL FOLLOW  Preliminary         Passed - HGB in normal range and within 360 days    Hemoglobin  Date Value Ref Range Status  09/02/2022 12.9 11.1 - 15.9 g/dL Final         Passed - Cr in normal range and within 360 days    Creatinine, Ser  Date Value Ref Range Status  03/07/2023 WILL FOLLOW  Preliminary         Passed - HCT in normal range and within 360 days    Hematocrit  Date Value Ref Range Status  09/02/2022 40.5 34.0 - 46.6 % Final         Passed - AST in normal range and within 360 days    AST  Date Value Ref Range Status  03/07/2023 WILL FOLLOW  Preliminary         Passed - eGFR is 30 or above and within 360 days    GFR calc Af Amer  Date Value Ref Range Status  08/30/2019 108 >59 mL/min/1.73 Final   GFR calc non Af Amer  Date Value Ref Range Status  08/30/2019 94 >59 mL/min/1.73 Final   eGFR  Date Value Ref Range Status  03/07/2023 WILL FOLLOW  Preliminary         Passed - Patient is not pregnant      Passed - Valid encounter within last 12 months    Recent Outpatient Visits           Yesterday Primary biliary cholangitis [facility])   [facility] Larae Grooms, NP   1 month ago Sore throat   [place] [facility] Jackolyn Confer, MD   6 months ago Pre-op exam   [place] [facility] Larae Grooms, NP   8 months ago Welcome to [employer] preventive visit   [place] [facility]  Larae Grooms, NP   9 months ago Essential hypertension   [place] [facility] Larae Grooms, NP       Future Appointments             In 1 month Larae Grooms, NP [place] [facility]

## 2023-03-16 ENCOUNTER — Encounter: Payer: Self-pay | Admitting: Nurse Practitioner

## 2023-03-17 ENCOUNTER — Other Ambulatory Visit: Payer: Self-pay | Admitting: Nurse Practitioner

## 2023-03-17 MED ORDER — HYDRALAZINE HCL 10 MG PO TABS: 10.0000 mg | ORAL_TABLET | Freq: Three times a day (TID) | ORAL | 1 refills | Status: DC

## 2023-03-17 NOTE — Telephone Encounter (Signed)
Requested Prescriptions  Refused Prescriptions Disp Refills   hydrALAZINE (APRESOLINE) 10 MG tablet [Pharmacy Med Name: HYDRALAZINE 10 MG TABLETS (ORANGE)] 270 tablet     Sig: TAKE 1 TABLET(10 MG) BY MOUTH THREE TIMES DAILY     Cardiovascular:  Vasodilators Failed - 03/17/2023  9:22 AM      Failed - ANA Screen, Ifa, Serum in normal range and within 360 days    No results found for: "ANA", "ANATITER", "LABANTI"       Failed - Last BP in normal range    BP Readings from Last 1 Encounters:  03/07/23 (!) 150/83         Passed - HCT in normal range and within 360 days    Hematocrit  Date Value Ref Range Status  09/02/2022 40.5 34.0 - 46.6 % Final         Passed - HGB in normal range and within 360 days    Hemoglobin  Date Value Ref Range Status  09/02/2022 12.9 11.1 - 15.9 g/dL Final         Passed - RBC in normal range and within 360 days    RBC  Date Value Ref Range Status  09/02/2022 4.39 3.77 - 5.28 x10E6/uL Final         Passed - WBC in normal range and within 360 days    WBC  Date Value Ref Range Status  09/02/2022 6.9 3.4 - 10.8 x10E3/uL Final         Passed - PLT in normal range and within 360 days    Platelets  Date Value Ref Range Status  09/02/2022 394 150 - 450 x10E3/uL Final         Passed - Valid encounter within last 12 months    Recent Outpatient Visits           1 week ago Primary biliary cholangitis (HCC)   [place] [facility] Larae Grooms, NP   1 month ago Sore throat   [place] [facility] Jackolyn Confer, MD   6 months ago Pre-op exam   [place] [facility] Larae Grooms, NP   8 months ago Welcome to [employer] preventive visit   [place] [facility] Larae Grooms, NP   10 months ago Essential hypertension   [place] [facility] Larae Grooms, NP       Future Appointments             In 3 weeks Larae Grooms, NP [place] [facility]

## 2023-03-29 ENCOUNTER — Ambulatory Visit
Admission: RE | Admit: 2023-03-29 | Discharge: 2023-03-29 | Disposition: A | Discharge status: Home / Self Care | Payer: Medicare Other | Source: Ambulatory Visit | Attending: Obstetrics and Gynecology

## 2023-03-29 DIAGNOSIS — Z1231 Encounter for screening mammogram for malignant neoplasm of breast: Secondary | ICD-10-CM

## 2023-04-10 ENCOUNTER — Encounter: Payer: Self-pay | Admitting: Nurse Practitioner

## 2023-04-11 ENCOUNTER — Ambulatory Visit: Payer: Medicare Other | Admitting: Nurse Practitioner

## 2023-04-12 NOTE — Telephone Encounter (Signed)
Called and scheduled appointment on 04/13/2023 @ 11:20 am.

## 2023-04-13 ENCOUNTER — Encounter: Payer: Self-pay | Admitting: Nurse Practitioner

## 2023-04-13 ENCOUNTER — Other Ambulatory Visit: Payer: Self-pay | Admitting: Nurse Practitioner

## 2023-04-13 ENCOUNTER — Ambulatory Visit (INDEPENDENT_AMBULATORY_CARE_PROVIDER_SITE_OTHER): Payer: Medicare Other | Admitting: Nurse Practitioner

## 2023-04-13 DIAGNOSIS — I10 Essential (primary) hypertension: Secondary | ICD-10-CM

## 2023-04-13 MED ORDER — HYDROCHLOROTHIAZIDE 25 MG PO TABS: 25.0000 mg | ORAL_TABLET | Freq: Every day | ORAL | 0 refills | Status: DC

## 2023-04-13 MED ORDER — TELMISARTAN 80 MG PO TABS: 80.0000 mg | ORAL_TABLET | Freq: Every day | ORAL | 0 refills | Status: DC

## 2023-04-13 NOTE — Progress Notes (Signed)
BP (!) 159/79 Comment: home blood pressure reading  Pulse 78   Temp 97.8 F (36.6 C) (Oral)   Ht 5' 6.6" (1.692 m)   Wt 211 lb 9.6 oz (96 kg)   SpO2 99%   BMI 33.54 kg/m    Subjective:    Patient ID: Michaela Hardy, female    DOB: 09/13/1955, 67 y.o.   MRN: [id number]  HPI: Michaela Hardy is a 67 y.o. female  Chief Complaint  Patient presents with   Blood Pressure Check   cold    Possible pneumonia    HYPERTENSION without Chronic Kidney Disease Hypertension status: uncontrolled Satisfied with current treatment? no Duration of hypertension: years BP monitoring frequency:  daily BP range: 150-160/70-80 BP medication side effects:  no Medication compliance: excellent compliance Previous BP meds: telmisartan Aspirin: no Recurrent headaches: no Visual changes: no Palpitations: no Dyspnea: no Chest pain: no Lower extremity edema: no Dizzy/lightheaded: no   UPPER RESPIRATORY TRACT INFECTION Worst symptom: Fever: no Cough: no Shortness of breath: no Wheezing: no Chest pain: yes, with cough Chest tightness: no Chest congestion: yes Nasal congestion: yes Runny nose: yes Post nasal drip: yes Sneezing: no Sore throat: no Swollen glands: no Sinus pressure: no Headache: no Face pain: no Toothache: no Ear pain: no bilateral Ear pressure: no bilateral Eyes red/itching:no Eye drainage/crusting: no  Vomiting: no Rash: no Fatigue: yes Sick contacts: yes Strep contacts: no  Context: improving    Relevant past medical, surgical, family and social history reviewed and updated as indicated. Interim medical history since our last visit reviewed. Allergies and medications reviewed and updated.  Review of Systems  Constitutional:  Positive for fatigue. Negative for fever.  HENT:  Positive for congestion, postnasal drip and rhinorrhea. Negative for dental problem, ear pain, sinus pressure, sinus pain, sneezing and sore throat.   Eyes:  Negative for visual  disturbance.  Respiratory:  Positive for cough. Negative for chest tightness, shortness of breath and wheezing.   Cardiovascular:  Negative for chest pain, palpitations and leg swelling.  Gastrointestinal:  Negative for vomiting.  Skin:  Negative for rash.  Neurological:  Negative for dizziness and headaches.    Per HPI unless specifically indicated above     Objective:    BP (!) 159/79 Comment: home blood pressure reading  Pulse 78   Temp 97.8 F (36.6 C) (Oral)   Ht 5' 6.6" (1.692 m)   Wt 211 lb 9.6 oz (96 kg)   SpO2 99%   BMI 33.54 kg/m   Wt Readings from Last 3 Encounters:  04/13/23 211 lb 9.6 oz (96 kg)  03/07/23 205 lb 3.2 oz (93.1 kg)  01/21/23 204 lb (92.5 kg)    Physical Exam Vitals and nursing note reviewed.  Constitutional:      General: She is not in acute distress.    Appearance: Normal appearance. She is normal weight. She is not ill-appearing, toxic-appearing or diaphoretic.  HENT:     Head: Normocephalic.     Right Ear: Tympanic membrane and external ear normal.     Left Ear: Tympanic membrane and external ear normal.     Nose: Congestion and rhinorrhea present.     Mouth/Throat:     Mouth: Mucous membranes are moist.     Pharynx: Oropharyngeal exudate and posterior oropharyngeal erythema present.  Eyes:     General:        Right eye: No discharge.        Left eye: No discharge.  Extraocular Movements: Extraocular movements intact.     Conjunctiva/sclera: Conjunctivae normal.     Pupils: Pupils are equal, round, and reactive to light.  Cardiovascular:     Rate and Rhythm: Normal rate and regular rhythm.     Heart sounds: No murmur heard. Pulmonary:     Effort: Pulmonary effort is normal. No respiratory distress.     Breath sounds: Normal breath sounds. No wheezing or rales.  Musculoskeletal:     Cervical back: Normal range of motion and neck supple.  Skin:    General: Skin is warm and dry.     Capillary Refill: Capillary refill takes less  than 2 seconds.  Neurological:     General: No focal deficit present.     Mental Status: She is alert and oriented to person, place, and time. Mental status is at baseline.  Psychiatric:        Mood and Affect: Mood normal.        Behavior: Behavior normal.        Thought Content: Thought content normal.        Judgment: Judgment normal.     Results for orders placed or performed in visit on 03/07/23  Comp Met (CMET)  Result Value Ref Range   Glucose 98 70 - 99 mg/dL   BUN 12 8 - 27 mg/dL   Creatinine, Ser 1.61 0.57 - 1.00 mg/dL   eGFR 98 >09 UE/AVW/0.98   BUN/Creatinine Ratio 19 12 - 28   Sodium 140 134 - 144 mmol/L   Potassium 4.2 3.5 - 5.2 mmol/L   Chloride 97 96 - 106 mmol/L   CO2 24 20 - 29 mmol/L   Calcium 9.7 8.7 - 10.3 mg/dL   Total Protein 7.6 6.0 - 8.5 g/dL   Albumin 4.8 3.9 - 4.9 g/dL   Globulin, Total 2.8 1.5 - 4.5 g/dL   Bilirubin Total 0.4 0.0 - 1.2 mg/dL   Alkaline Phosphatase 184 (H) 44 - 121 IU/L   AST 28 0 - 40 IU/L   ALT 36 (H) 0 - 32 IU/L  Lipid Profile  Result Value Ref Range   Cholesterol, Total 194 100 - 199 mg/dL   Triglycerides 119 0 - 149 mg/dL   HDL 66 >14 mg/dL   VLDL Cholesterol Cal 24 5 - 40 mg/dL   LDL Chol Calc (NIH) 782 (H) 0 - 99 mg/dL   Chol/HDL Ratio 2.9 0.0 - 4.4 ratio  HgB A1c  Result Value Ref Range   Hgb A1c MFr Bld 6.3 (H) 4.8 - 5.6 %   Est. average glucose Bld gHb Est-mCnc 134 mg/dL      Assessment & Plan:   Problem List Items Addressed This Visit       Cardiovascular and Mediastinum   Essential hypertension    Chronic.  Not well controlled.  Patient did well with Telmisartan in the past, however it was more expensive for her and was switched to Losartan for it to be cheaper.  However, since then her blood pressure at not been well controlled.  Will stop the Losartan and restart Telmisartan.  Continue with hydrochlorothiazide and hydralazine.  Follow up in 3 months.  Patient will send updated home blood pressure readings  via mychart for refills in 3 weeks.  If blood pressure remains uncontrolled will have her come back sooner and increase hydralazine.      Relevant Medications   telmisartan (MICARDIS) 80 MG tablet   hydrochlorothiazide (HYDRODIURIL) 25 MG tablet  Follow up plan: Return in about 2 months (around 06/14/2023) for HTN, HLD, DM2 FU.

## 2023-04-13 NOTE — Assessment & Plan Note (Addendum)
Chronic.  Not well controlled.  Patient did well with Telmisartan in the past, however it was more expensive for her and was switched to Losartan for it to be cheaper.  However, since then her blood pressure at not been well controlled.  Will stop the Losartan and restart Telmisartan.  Continue with hydrochlorothiazide and hydralazine.  Follow up in 3 months.  Patient will send updated home blood pressure readings via mychart for refills in 3 weeks.  If blood pressure remains uncontrolled will have her come back sooner and increase hydralazine.

## 2023-04-15 NOTE — Telephone Encounter (Signed)
Requested Prescriptions  Pending Prescriptions Disp Refills   hydrochlorothiazide (HYDRODIURIL) 25 MG tablet [Pharmacy Med Name: HYDROCHLOROTHIAZIDE 25MG TABLETS] 90 tablet     Sig: TAKE 1 TABLET(25 MG) BY MOUTH DAILY     Cardiovascular: Diuretics - Thiazide Failed - 04/13/2023  1:03 PM      Failed - Last BP in normal range    BP Readings from Last 1 Encounters:  04/13/23 (!) 159/79         Passed - Cr in normal range and within 180 days    Creatinine, Ser  Date Value Ref Range Status  03/07/2023 0.62 0.57 - 1.00 mg/dL Final         Passed - K in normal range and within 180 days    Potassium  Date Value Ref Range Status  03/07/2023 4.2 3.5 - 5.2 mmol/L Final         Passed - Na in normal range and within 180 days    Sodium  Date Value Ref Range Status  03/07/2023 140 134 - 144 mmol/L Final         Passed - Valid encounter within last 6 months    Recent Outpatient Visits           2 days ago Essential hypertension   [place] [facility] Larae Grooms, NP   1 month ago Primary biliary cholangitis [facility])   [place] [facility] Lar[facility] G[facility], NP   2 months ago Sore throat   [place] [facility] Jackolyn Confer, MD   7 months ago Pre-op exam   [place] [facility] Larae Grooms, NP   9 months ago Welcome to [employer] preventive visit   [place] [facility] Larae Grooms, NP       Future Appointments             In 2 months Larae Grooms, NP [place] [facility]

## 2023-04-29 ENCOUNTER — Encounter: Payer: Self-pay | Admitting: Nurse Practitioner

## 2023-05-02 MED ORDER — TELMISARTAN 80 MG PO TABS: 80.0000 mg | ORAL_TABLET | Freq: Every day | ORAL | 0 refills | Status: DC

## 2023-05-05 ENCOUNTER — Other Ambulatory Visit: Payer: Self-pay | Admitting: Nurse Practitioner

## 2023-05-05 NOTE — Telephone Encounter (Signed)
Medication Refill -  Most Recent Primary Care Visit:  Provider: Larae Grooms  Department: [facility]  Visit Type: OFFICE VISIT  Date: 04/13/2023  Medication: hydrALAZINE (APRESOLINE) 10 MG tablet and telmisartan (MICARDIS) 80 MG tablet   Has the patient contacted their pharmacy? Yes  Is this the correct pharmacy for this prescription? Yes  This is the patient's preferred pharmacy: [id number] - [place], [place] - [address] [facility] [address] [place] [address] Phone: [phone] Fax: [phone]  Has the prescription been filled recently? Yes  Is the patient out of the medication? Yes  Has the patient been seen for an appointment in the last year OR does the patient have an upcoming appointment? Yes  Can we respond through MyChart? No  Agent: Please be advised that Rx refills may take up to 3 business days. We ask that you follow-up with your pharmacy.  Phamacy stated they never recvd script for telmisartan (MICARDIS) 80 MG tablet even though chart shows they confirmed recpt. Please f/u with pharmacy

## 2023-05-06 ENCOUNTER — Other Ambulatory Visit: Payer: Self-pay | Admitting: Nurse Practitioner

## 2023-05-07 ENCOUNTER — Other Ambulatory Visit: Payer: Self-pay | Admitting: Nurse Practitioner

## 2023-05-09 ENCOUNTER — Other Ambulatory Visit: Payer: Self-pay | Admitting: Nurse Practitioner

## 2023-05-10 MED ORDER — HYDRALAZINE HCL 10 MG PO TABS: 10.0000 mg | ORAL_TABLET | Freq: Three times a day (TID) | ORAL | 1 refills | Status: DC

## 2023-05-10 NOTE — Telephone Encounter (Signed)
 Requested medication (s) are due for refill today: yes  Requested medication (s) are on the active medication list: yes  Last refill:  03/17/23 #90 1 RF  Future visit scheduled: yes  Notes to clinic:  No results for ANA, ANAtiter, Labanti   Requested Prescriptions  Pending Prescriptions Disp Refills   hydrALAZINE  (APRESOLINE ) 10 MG tablet 90 tablet     Sig: Take 1 tablet (10 mg total) by mouth 3 (three) times daily.     Cardiovascular:  Vasodilators Failed - 05/10/2023 12:35 PM      Failed - ANA Screen, Ifa, Serum in normal range and within 360 days    No results found for: ANA, ANATITER, LABANTI       Failed - Last BP in normal range    BP Readings from Last 1 Encounters:  04/13/23 (!) 159/79         Passed - HCT in normal range and within 360 days    Hematocrit  Date Value Ref Range Status  09/02/2022 40.5 34.0 - 46.6 % Final         Passed - HGB in normal range and within 360 days    Hemoglobin  Date Value Ref Range Status  09/02/2022 12.9 11.1 - 15.9 g/dL Final         Passed - RBC in normal range and within 360 days    RBC  Date Value Ref Range Status  09/02/2022 4.39 3.77 - 5.28 x10E6/uL Final         Passed - WBC in normal range and within 360 days    WBC  Date Value Ref Range Status  09/02/2022 6.9 3.4 - 10.8 x10E3/uL Final         Passed - PLT in normal range and within 360 days    Platelets  Date Value Ref Range Status  09/02/2022 394 150 - 450 x10E3/uL Final         Passed - Valid encounter within last 12 months    Recent Outpatient Visits           3 weeks ago Essential hypertension   [place] [facility] [name], NP   2 months ago Primary biliary cholangitis [facility])   [place] [facility] [name], NP   3 months ago Sore throat   [place] [facility] Herold Hadassah SQUIBB, MD   8 months ago Pre-op exam   [place] [facility] [name], NP   10 months  ago Welcome to [employer] preventive visit   [place] [facility] [name], NP       Future Appointments             In 1 month [name], NP [place] [facility]            Refused Prescriptions Disp Refills   telmisartan  (MICARDIS ) 80 MG tablet 30 tablet     Sig: Take 1 tablet (80 mg total) by mouth daily.     Cardiovascular:  Angiotensin Receptor Blockers Failed - 05/10/2023 12:35 PM      Failed - Last BP in normal range    BP Readings from Last 1 Encounters:  04/13/23 (!) 159/79         Passed - Cr in normal range and within 180 days    Creatinine, Ser  Date Value Ref Range Status  03/07/2023 0.62 0.57 - 1.00 mg/dL Final  Passed - K in normal range and within 180 days    Potassium  Date Value Ref Range Status  03/07/2023 4.2 3.5 - 5.2 mmol/L Final         Passed - Patient is not pregnant      Passed - Valid encounter within last 6 months    Recent Outpatient Visits           3 weeks ago Essential hypertension   [place] [facility] [name], NP   2 months ago Primary biliary cholangitis [facility])   [place] [facility] [name], NP   3 months ago Sore throat   [place] [facility] Herold Hadassah SQUIBB, MD   8 months ago Pre-op exam   [place] [facility] [name], NP   10 months ago Welcome to [employer] preventive visit   [place] [facility] [name], NP       Future Appointments             In 1 month [name], NP  [facility]

## 2023-05-10 NOTE — Telephone Encounter (Signed)
 Requested Prescriptions  Pending Prescriptions Disp Refills   hydrALAZINE  (APRESOLINE ) 10 MG tablet 90 tablet     Sig: Take 1 tablet (10 mg total) by mouth 3 (three) times daily.     Cardiovascular:  Vasodilators Failed - 05/10/2023 12:34 PM      Failed - ANA Screen, Ifa, Serum in normal range and within 360 days    No results found for: ANA, ANATITER, LABANTI       Failed - Last BP in normal range    BP Readings from Last 1 Encounters:  04/13/23 (!) 159/79         Passed - HCT in normal range and within 360 days    Hematocrit  Date Value Ref Range Status  09/02/2022 40.5 34.0 - 46.6 % Final         Passed - HGB in normal range and within 360 days    Hemoglobin  Date Value Ref Range Status  09/02/2022 12.9 11.1 - 15.9 g/dL Final         Passed - RBC in normal range and within 360 days    RBC  Date Value Ref Range Status  09/02/2022 4.39 3.77 - 5.28 x10E6/uL Final         Passed - WBC in normal range and within 360 days    WBC  Date Value Ref Range Status  09/02/2022 6.9 3.4 - 10.8 x10E3/uL Final         Passed - PLT in normal range and within 360 days    Platelets  Date Value Ref Range Status  09/02/2022 394 150 - 450 x10E3/uL Final         Passed - Valid encounter within last 12 months    Recent Outpatient Visits           3 weeks ago Essential hypertension   [facility] [name], NP   2 months ago Primary biliary cholangitis [facility])   [place] [facility] [name], NP   3 months ago Sore throat   [place] [facility] Herold Hadassah SQUIBB, MD   8 months ago Pre-op exam   [place] [name], [facility]   10 months ago Welcome to [employer] preventive visit   [place] [facility] [name], NP       Future Appointments             In 1 month [name], NP [place] [facility]              telmisartan  (MICARDIS ) 80 MG tablet 30 tablet     Sig: Take 1 tablet (80 mg total) by mouth daily.     Cardiovascular:  Angiotensin Receptor Blockers Failed - 05/10/2023 12:34 PM      Failed - Last BP in normal range    BP Readings from Last 1 Encounters:  04/13/23 (!) 159/79         Passed - Cr in normal range and within 180 days    Creatinine, Ser  Date Value Ref Range Status  03/07/2023 0.62 0.57 - 1.00 mg/dL Final         Passed - K in normal range and within 180 days    Potassium  Date Value Ref Range Status  03/07/2023 4.2 3.5 - 5.2 mmol/L Final         Passed - Patient is not pregnant      Passed - Valid  encounter within last 6 months    Recent Outpatient Visits           3 weeks ago Essential hypertension   [place] [facility] [name], NP   2 months ago Primary biliary cholangitis [facility])   [place] [facility] [name], NP   3 months ago Sore throat   [place] [facility] Herold Hadassah SQUIBB, MD   8 months ago Pre-op exam   [place] [facility] [name], NP   10 months ago Welcome to [employer] preventive visit   [place] [facility] [name], NP       Future Appointments             In 1 month [name], NP [place] [facility]

## 2023-05-10 NOTE — Telephone Encounter (Signed)
 Phamacy stated they never recvd script for telmisartan (MICARDIS) 80 MG tablet even though chart shows they confirmed recpt. Please f/u with pharmacy

## 2023-05-12 NOTE — Telephone Encounter (Addendum)
 Unable to reach the pharmacy on several attempts, phone rings, placed on hold, then it connects to an answering machine. Per the message in the patient message encounter, [facility] is getting [facility] ready to refill today. Will refuse this request due to this.  Requested Prescriptions  Pending Prescriptions Disp Refills   telmisartan  (MICARDIS ) 80 MG tablet [Pharmacy Med Name: TELMISARTAN  80MG  TABLETS] 90 tablet     Sig: TAKE 1 TABLET BY MOUTH DAILY     Cardiovascular:  Angiotensin Receptor Blockers Failed - 05/12/2023  3:31 PM      Failed - Last BP in normal range    BP Readings from Last 1 Encounters:  04/13/23 (!) 159/79         Passed - Cr in normal range and within 180 days    Creatinine, Ser  Date Value Ref Range Status  03/07/2023 0.62 0.57 - 1.00 mg/dL Final         Passed - K in normal range and within 180 days    Potassium  Date Value Ref Range Status  03/07/2023 4.2 3.5 - 5.2 mmol/L Final         Passed - Patient is not pregnant      Passed - Valid encounter within last 6 months    Recent Outpatient Visits           4 weeks ago Essential hypertension   [place] [name], NP   2 months ago Primary biliary cholangitis [facility])   [place] [facility] [name], NP   3 months ago Sore throat   [place] [facility] Herold Hadassah SQUIBB, MD   8 months ago Pre-op exam   [place] [facility] [name], NP   10 months ago Welcome to [employer] preventive visit   [place] [facility] [name], NP       Future Appointments             In 1 month [name], NP [place] [facility]             hydrALAZINE  (APRESOLINE ) 10 MG tablet [Pharmacy Med Name: HYDRALAZINE  10 MG TABLETS (ORANGE)] 270 tablet     Sig: TAKE 1 TABLET BY MOUTH THREE TIMES DAILY     Cardiovascular:  Vasodilators Failed - 05/12/2023  3:31 PM       Failed - ANA Screen, Ifa, Serum in normal range and within 360 days    No results found for: ANA, ANATITER, LABANTI       Failed - Last BP in normal range    BP Readings from Last 1 Encounters:  04/13/23 (!) 159/79         Passed - HCT in normal range and within 360 days    Hematocrit  Date Value Ref Range Status  09/02/2022 40.5 34.0 - 46.6 % Final         Passed - HGB in normal range and within 360 days    Hemoglobin  Date Value Ref Range Status  09/02/2022 12.9 11.1 - 15.9 g/dL Final         Passed - RBC in normal range and within 360 days    RBC  Date Value Ref Range Status  09/02/2022 4.39 3.77 - 5.28 x10E6/uL Final         Passed - WBC in normal range and within 360 days    WBC  Date Value Ref Range Status  09/02/2022  6.9 3.4 - 10.8 x10E3/uL Final         Passed - PLT in normal range and within 360 days    Platelets  Date Value Ref Range Status  09/02/2022 394 150 - 450 x10E3/uL Final         Passed - Valid encounter within last 12 months    Recent Outpatient Visits           4 weeks ago Essential hypertension   [place] [facility] [name], NP   2 months ago Primary biliary cholangitis [facility])   [place] [name], [facility]   3 months ago Sore throat   [place] [facility] Herold Hadassah SQUIBB, MD   8 months ago Pre-op exam   [place] [name], NP   10 months ago Welcome to [employer] preventive visit   [place] [facility] [name], NP       Future Appointments             In 1 month [name], NP  [facility]

## 2023-05-12 NOTE — Telephone Encounter (Signed)
 Requested Prescriptions  Pending Prescriptions Disp Refills   hydrALAZINE  (APRESOLINE ) 10 MG tablet [Pharmacy Med Name: HYDRALAZINE  10 MG TABLETS (ORANGE)] 90 tablet 1    Sig: TAKE 1 TABLET(10 MG) BY MOUTH THREE TIMES DAILY     Cardiovascular:  Vasodilators Failed - 05/12/2023  7:55 AM      Failed - ANA Screen, Ifa, Serum in normal range and within 360 days    No results found for: ANA, ANATITER, LABANTI       Failed - Last BP in normal range    BP Readings from Last 1 Encounters:  04/13/23 (!) 159/79         Passed - HCT in normal range and within 360 days    Hematocrit  Date Value Ref Range Status  09/02/2022 40.5 34.0 - 46.6 % Final         Passed - HGB in normal range and within 360 days    Hemoglobin  Date Value Ref Range Status  09/02/2022 12.9 11.1 - 15.9 g/dL Final         Passed - RBC in normal range and within 360 days    RBC  Date Value Ref Range Status  09/02/2022 4.39 3.77 - 5.28 x10E6/uL Final         Passed - WBC in normal range and within 360 days    WBC  Date Value Ref Range Status  09/02/2022 6.9 3.4 - 10.8 x10E3/uL Final         Passed - PLT in normal range and within 360 days    Platelets  Date Value Ref Range Status  09/02/2022 394 150 - 450 x10E3/uL Final         Passed - Valid encounter within last 12 months    Recent Outpatient Visits           4 weeks ago Essential hypertension   [place] [name], NP   2 months ago Primary biliary cholangitis [facility])   [place] [facility] [name], NP   3 months ago Sore throat   [place] [facility] Herold Hadassah SQUIBB, MD   8 months ago Pre-op exam   [place] [facility] [name], NP   10 months ago Welcome to [employer] preventive visit   [place] [facility] [name], NP       Future Appointments             In 1 month [name], NP [place] [facility]

## 2023-05-13 NOTE — Telephone Encounter (Signed)
 Requested Prescriptions  Pending Prescriptions Disp Refills   hydrochlorothiazide  (HYDRODIURIL ) 25 MG tablet [Pharmacy Med Name: HYDROCHLOROTHIAZIDE  25MG TABLETS] 30 tablet 0    Sig: TAKE 1 TABLET(25 MG) BY MOUTH DAILY     Cardiovascular: Diuretics - Thiazide Failed - 05/13/2023  1:18 PM      Failed - Last BP in normal range    BP Readings from Last 1 Encounters:  04/13/23 (!) 159/79         Passed - Cr in normal range and within 180 days    Creatinine, Ser  Date Value Ref Range Status  03/07/2023 0.62 0.57 - 1.00 mg/dL Final         Passed - K in normal range and within 180 days    Potassium  Date Value Ref Range Status  03/07/2023 4.2 3.5 - 5.2 mmol/L Final         Passed - Na in normal range and within 180 days    Sodium  Date Value Ref Range Status  03/07/2023 140 134 - 144 mmol/L Final         Passed - Valid encounter within last 6 months    Recent Outpatient Visits           1 month ago Essential hypertension   [place] [facility] [name], NP   2 months ago Primary biliary cholangitis [facility])   [place] [name], NP   3 months ago Sore throat   [place] [facility] Herold Hadassah SQUIBB, MD   8 months ago Pre-op exam   [place] [facility] [name], NP   10 months ago Welcome to [employer] preventive visit   [place] [facility] [name], NP       Future Appointments             In 1 month [name], NP [place] [facility]

## 2023-05-21 ENCOUNTER — Other Ambulatory Visit: Payer: Self-pay | Admitting: Nurse Practitioner

## 2023-05-23 ENCOUNTER — Ambulatory Visit (INDEPENDENT_AMBULATORY_CARE_PROVIDER_SITE_OTHER): Payer: Medicare Other | Admitting: Nurse Practitioner

## 2023-05-23 ENCOUNTER — Encounter: Payer: Self-pay | Admitting: Nurse Practitioner

## 2023-05-23 VITALS — BP 138/76 | HR 85 | Temp 97.5°F | Ht 67.0 in | Wt 208.8 lb

## 2023-05-23 DIAGNOSIS — M62838 Other muscle spasm: Secondary | ICD-10-CM | POA: Diagnosis not present

## 2023-05-23 DIAGNOSIS — I10 Essential (primary) hypertension: Secondary | ICD-10-CM

## 2023-05-23 MED ORDER — ROSUVASTATIN CALCIUM 10 MG PO TABS: 10.0000 mg | ORAL_TABLET | Freq: Every day | ORAL | 1 refills | Status: DC

## 2023-05-23 MED ORDER — TELMISARTAN 80 MG PO TABS: 80.0000 mg | ORAL_TABLET | Freq: Every day | ORAL | 1 refills | Status: DC

## 2023-05-23 MED ORDER — HYDRALAZINE HCL 10 MG PO TABS: 10.0000 mg | ORAL_TABLET | Freq: Three times a day (TID) | ORAL | 1 refills | Status: DC

## 2023-05-23 MED ORDER — HYDROCHLOROTHIAZIDE 25 MG PO TABS: 25.0000 mg | ORAL_TABLET | Freq: Every day | ORAL | 1 refills | Status: DC

## 2023-05-23 NOTE — Telephone Encounter (Signed)
 Requested Prescriptions  Refused Prescriptions Disp Refills   losartan -hydrochlorothiazide  (HYZAAR) 100-25 MG tablet [Pharmacy Med Name: Losartan  Potassium-HCTZ 100-25 MG Oral Tablet] 90 tablet 3    Sig: TAKE 1 TABLET BY MOUTH DAILY     Cardiovascular: ARB + Diuretic Combos Passed - 05/23/2023  5:44 PM      Passed - K in normal range and within 180 days    Potassium  Date Value Ref Range Status  03/07/2023 4.2 3.5 - 5.2 mmol/L Final         Passed - Na in normal range and within 180 days    Sodium  Date Value Ref Range Status  03/07/2023 140 134 - 144 mmol/L Final         Passed - Cr in normal range and within 180 days    Creatinine, Ser  Date Value Ref Range Status  03/07/2023 0.62 0.57 - 1.00 mg/dL Final         Passed - eGFR is 10 or above and within 180 days    GFR calc Af Amer  Date Value Ref Range Status  08/30/2019 108 >59 mL/min/1.73 Final   GFR calc non Af Amer  Date Value Ref Range Status  08/30/2019 94 >59 mL/min/1.73 Final   eGFR  Date Value Ref Range Status  03/07/2023 98 >59 mL/min/1.73 Final         Passed - Patient is not pregnant      Passed - Last BP in normal range    BP Readings from Last 1 Encounters:  05/23/23 138/76         Passed - Valid encounter within last 6 months    Recent Outpatient Visits           Today Muscle spasm   [place] [facility] [name], NP   1 month ago Essential hypertension   [place] [facility] [name], NP   2 months ago Primary biliary cholangitis [facility])   [place] [facility] [name], NP   4 months ago Sore throat   [place] [facility] Herold Hadassah SQUIBB, MD   8 months ago Pre-op exam   [place] [facility] [name], NP       Future Appointments             In 3 months [name], NP [place] [facility]

## 2023-05-23 NOTE — Progress Notes (Signed)
 BP 138/76 (BP Location: Right Arm, Patient Position: Sitting, Cuff Size: Large)   Pulse 85   Temp (!) 97.5 F (36.4 C) (Oral)   Ht 5' 7 (1.702 m)   Wt 208 lb 12.8 oz (94.7 kg)   SpO2 99%   BMI 32.70 kg/m    Subjective:    Patient ID: Michaela Hardy, female    DOB: [birth year], 68 y.o.   MRN: [id number]  HPI: Michaela Hardy is a 68 y.o. female  Chief Complaint  Patient presents with   Muscle Pain    Shoulders, arms, neck, started a week ago but has subsided a little, prior treatment as been OTC medications, heat pads    Patient states she has been having muscle pain in her neck, arms, and upper shoulder.  She was using a heating pad which had helped.  She doesn't think she injured herself or didn't over due use.  States for about 4 days it was really sore.   HYPERTENSION without Chronic Kidney Disease Hypertension status: controlled  Satisfied with current treatment? no Duration of hypertension: years BP monitoring frequency:  daily BP range: 110-130/80 BP medication side effects:  no Medication compliance: excellent compliance Previous BP meds: telmisartan , amlodipine , and HCTZ Aspirin: no Recurrent headaches: no Visual changes: no Palpitations: no Dyspnea: no Chest pain: no Lower extremity edema: no Dizzy/lightheaded: no  Relevant past medical, surgical, family and social history reviewed and updated as indicated. Interim medical history since our last visit reviewed. Allergies and medications reviewed and updated.  Review of Systems  Eyes:  Negative for visual disturbance.  Respiratory:  Negative for cough, chest tightness and shortness of breath.   Cardiovascular:  Negative for chest pain, palpitations and leg swelling.  Musculoskeletal:  Positive for neck pain.  Neurological:  Negative for dizziness and headaches.    Per HPI unless specifically indicated above     Objective:    BP 138/76 (BP Location: Right Arm, Patient Position: Sitting, Cuff Size:  Large)   Pulse 85   Temp (!) 97.5 F (36.4 C) (Oral)   Ht 5' 7 (1.702 m)   Wt 208 lb 12.8 oz (94.7 kg)   SpO2 99%   BMI 32.70 kg/m   Wt Readings from Last 3 Encounters:  05/23/23 208 lb 12.8 oz (94.7 kg)  04/13/23 211 lb 9.6 oz (96 kg)  03/07/23 205 lb 3.2 oz (93.1 kg)    Physical Exam Vitals and nursing note reviewed.  Constitutional:      General: She is not in acute distress.    Appearance: Normal appearance. She is normal weight. She is not ill-appearing, toxic-appearing or diaphoretic.  HENT:     Head: Normocephalic.     Right Ear: External ear normal.     Left Ear: External ear normal.     Nose: Nose normal.     Mouth/Throat:     Mouth: Mucous membranes are moist.     Pharynx: Oropharynx is clear.  Eyes:     General:        Right eye: No discharge.        Left eye: No discharge.     Extraocular Movements: Extraocular movements intact.     Conjunctiva/sclera: Conjunctivae normal.     Pupils: Pupils are equal, round, and reactive to light.  Cardiovascular:     Rate and Rhythm: Normal rate and regular rhythm.     Heart sounds: No murmur heard. Pulmonary:     Effort: Pulmonary effort is normal.  No respiratory distress.     Breath sounds: Normal breath sounds. No wheezing or rales.  Musculoskeletal:     Cervical back: Normal range of motion and neck supple.  Skin:    General: Skin is warm and dry.     Capillary Refill: Capillary refill takes less than 2 seconds.  Neurological:     General: No focal deficit present.     Mental Status: She is alert and oriented to person, place, and time. Mental status is at baseline.  Psychiatric:        Mood and Affect: Mood normal.        Behavior: Behavior normal.        Thought Content: Thought content normal.        Judgment: Judgment normal.     Results for orders placed or performed in visit on 03/07/23  Comp Met (CMET)   Collection Time: 03/07/23  2:25 PM  Result Value Ref Range   Glucose 98 70 - 99 mg/dL   BUN 12  8 - 27 mg/dL   Creatinine, Ser 9.37 0.57 - 1.00 mg/dL   eGFR 98 >40 fO/fpw/8.26   BUN/Creatinine Ratio 19 12 - 28   Sodium 140 134 - 144 mmol/L   Potassium 4.2 3.5 - 5.2 mmol/L   Chloride 97 96 - 106 mmol/L   CO2 24 20 - 29 mmol/L   Calcium  9.7 8.7 - 10.3 mg/dL   Total Protein 7.6 6.0 - 8.5 g/dL   Albumin 4.8 3.9 - 4.9 g/dL   Globulin, Total 2.8 1.5 - 4.5 g/dL   Bilirubin Total 0.4 0.0 - 1.2 mg/dL   Alkaline Phosphatase 184 (H) 44 - 121 IU/L   AST 28 0 - 40 IU/L   ALT 36 (H) 0 - 32 IU/L  Lipid Profile   Collection Time: 03/07/23  2:25 PM  Result Value Ref Range   Cholesterol, Total 194 100 - 199 mg/dL   Triglycerides 861 0 - 149 mg/dL   HDL 66 >60 mg/dL   VLDL Cholesterol Cal 24 5 - 40 mg/dL   LDL Chol Calc (NIH) 895 (H) 0 - 99 mg/dL   Chol/HDL Ratio 2.9 0.0 - 4.4 ratio  HgB A1c   Collection Time: 03/07/23  2:25 PM  Result Value Ref Range   Hgb A1c MFr Bld 6.3 (H) 4.8 - 5.6 %   Est. average glucose Bld gHb Est-mCnc 134 mg/dL      Assessment & Plan:   Problem List Items Addressed This Visit       Cardiovascular and Mediastinum   Essential hypertension   Chronic.  Controlled.  Continue with current medication regimen of Amlodipine , Telmisartan , hydrochlorothiazide , and hydralazine ..  Labs ordered today.  Return to clinic in 6 months for reevaluation.  Call sooner if concerns arise.        Relevant Medications   hydrALAZINE  (APRESOLINE ) 10 MG tablet   hydrochlorothiazide  (HYDRODIURIL ) 25 MG tablet   telmisartan  (MICARDIS ) 80 MG tablet   rosuvastatin  (CRESTOR ) 10 MG tablet   Other Visit Diagnoses       Muscle spasm    -  Primary   Continue with PRN OTC pain reliever and heating pad.  Follow up if symptoms not improved.        Follow up plan: Return in about 3 months (around 08/21/2023) for HTN, HLD, DM2 FU.

## 2023-05-23 NOTE — Assessment & Plan Note (Signed)
 Chronic.  Controlled.  Continue with current medication regimen of Amlodipine, Telmisartan, hydrochlorothiazide, and hydralazine..  Labs ordered today.  Return to clinic in 6 months for reevaluation.  Call sooner if concerns arise.

## 2023-06-09 ENCOUNTER — Other Ambulatory Visit: Payer: Self-pay | Admitting: Nurse Practitioner

## 2023-06-10 NOTE — Telephone Encounter (Signed)
Requested Prescriptions  Refused Prescriptions Disp Refills   hydrochlorothiazide (HYDRODIURIL) 25 MG tablet [Pharmacy Med Name: HYDROCHLOROTHIAZIDE 25MG TABLETS] 30 tablet     Sig: TAKE 1 TABLET(25 MG) BY MOUTH DAILY     Cardiovascular: Diuretics - Thiazide Passed - 06/10/2023 11:03 AM      Passed - Cr in normal range and within 180 days    Creatinine, Ser  Date Value Ref Range Status  03/07/2023 0.62 0.57 - 1.00 mg/dL Final         Passed - K in normal range and within 180 days    Potassium  Date Value Ref Range Status  03/07/2023 4.2 3.5 - 5.2 mmol/L Final         Passed - Na in normal range and within 180 days    Sodium  Date Value Ref Range Status  03/07/2023 140 134 - 144 mmol/L Final         Passed - Last BP in normal range    BP Readings from Last 1 Encounters:  05/23/23 138/76         Passed - Valid encounter within last 6 months    Recent Outpatient Visits           2 weeks ago Muscle spasm   [place] [facility] Larae Grooms, NP   1 month ago Essential hypertension   [place] [facility] Larae Grooms, NP   3 months ago Primary biliary cholangitis [facility])   [place] [facility] Larae Grooms, NP   4 months ago Sore throat   [place] [facility] Jackolyn Confer, MD   9 months ago Pre-op exam   [place] [facility] Larae Grooms, NP       Future Appointments             In 2 months Larae Grooms, NP [place] [facility]

## 2023-06-14 ENCOUNTER — Ambulatory Visit: Payer: Medicare Other | Admitting: Nurse Practitioner

## 2023-06-18 ENCOUNTER — Other Ambulatory Visit: Payer: Self-pay | Admitting: Nurse Practitioner

## 2023-06-20 NOTE — Telephone Encounter (Signed)
 Sending to [employer].  Requested Prescriptions  Pending Prescriptions Disp Refills   telmisartan  (MICARDIS ) 80 MG tablet [Pharmacy Med Name: TELMISARTAN  80MG  TABLETS] 90 tablet 0    Sig: TAKE 1 TABLET BY MOUTH DAILY     Cardiovascular:  Angiotensin Receptor Blockers Passed - 06/20/2023  1:55 PM      Passed - Cr in normal range and within 180 days    Creatinine, Ser  Date Value Ref Range Status  03/07/2023 0.62 0.57 - 1.00 mg/dL Final         Passed - K in normal range and within 180 days    Potassium  Date Value Ref Range Status  03/07/2023 4.2 3.5 - 5.2 mmol/L Final         Passed - Patient is not pregnant      Passed - Last BP in normal range    BP Readings from Last 1 Encounters:  05/23/23 138/76         Passed - Valid encounter within last 6 months    Recent Outpatient Visits           4 weeks ago Muscle spasm   [place] [facility] Aileen Alexanders, NP   2 months ago Essential hypertension   [place] [facility] Aileen Alexanders, NP   3 months ago Primary biliary cholangitis [facility])   [place] [facility] Aileen Alexanders, NP   5 months ago Sore throat   [place] [facility] Hadassah Letters, MD   9 months ago Pre-op exam   [place] [facility] Aileen Alexanders, NP       Future Appointments             In 2 months Aileen Alexanders, NP [facility]

## 2023-07-05 ENCOUNTER — Ambulatory Visit: Payer: Medicare Other | Admitting: Emergency Medicine

## 2023-07-05 VITALS — Ht 67.0 in | Wt 210.0 lb

## 2023-07-05 DIAGNOSIS — Z780 Asymptomatic menopausal state: Secondary | ICD-10-CM | POA: Diagnosis not present

## 2023-07-05 DIAGNOSIS — Z0000 Encounter for general adult medical examination without abnormal findings: Secondary | ICD-10-CM | POA: Diagnosis not present

## 2023-07-05 NOTE — Progress Notes (Signed)
 Subjective:   Michaela Hardy is a 68 y.o. who presents for a Medicare Wellness preventive visit.  Visit Complete: Virtual I connected with  Michaela Hardy on 07/05/23 by a video and audio enabled telemedicine application and verified that I am speaking with the correct person using two identifiers.  Patient Location: Home  Provider Location: Office/Clinic  I discussed the limitations of evaluation and management by telemedicine. The patient expressed understanding and agreed to proceed.  Vital Signs: Because this visit was a virtual/telehealth visit, some criteria may be missing or patient reported. Any vitals not documented were not able to be obtained and vitals that have been documented are patient reported.   AWV Questionnaire: Yes: Patient Medicare AWV questionnaire was completed by the patient on 07/02/23; I have confirmed that all information answered by patient is correct and no changes since this date.  Cardiac Risk Factors include: advanced age (>8men, >67 women);hypertension;dyslipidemia;obesity (BMI >30kg/m2);Other (see comment), Risk factor comments: prediabetic     Objective:    Today's Vitals   07/05/23 1430  Weight: 210 lb (95.3 kg)  Height: 5\' 7"  (1.702 m)   Body mass index is 32.89 kg/m.     07/05/2023    2:42 PM 12/22/2021    3:04 PM 01/13/2021    4:34 PM 11/30/2016   10:04 AM 09/10/2015    8:10 AM 06/12/2015   11:11 AM 11/21/2014   11:15 AM  Advanced Directives  Does Patient Have a Medical Advance Directive? No No No No No No No  Would patient like information on creating a medical advance directive? No - Patient declined No - Patient declined No - Patient declined   No - patient declined information No - patient declined information    Current Medications (verified) Outpatient Encounter Medications as of 07/05/2023  Medication Sig   calcium carbonate (OS-CAL) 600 MG TABS tablet Take 1 tablet by mouth daily with breakfast.    hydrALAZINE (APRESOLINE) 10  MG tablet Take 1 tablet (10 mg total) by mouth 3 (three) times daily.   hydrochlorothiazide (HYDRODIURIL) 25 MG tablet Take 1 tablet (25 mg total) by mouth daily.   Multiple Vitamin tablet Take 1 tablet by mouth daily.   omeprazole (PRILOSEC) 20 MG capsule Take 1 capsule by mouth daily.   rosuvastatin (CRESTOR) 10 MG tablet Take 1 tablet (10 mg total) by mouth daily.   telmisartan (MICARDIS) 80 MG tablet TAKE 1 TABLET BY MOUTH DAILY   ursodiol (ACTIGALL) 300 MG capsule Take 2 capsules by mouth 2 (two) times daily.   No facility-administered encounter medications on file as of 07/05/2023.    Allergies (verified) Amlodipine and Nifedipine   History: Past Medical History:  Diagnosis Date   Anxiety    Depression    GERD (gastroesophageal reflux disease)    Hepatitis    Primary bilary cholangitis   Hyperlipidemia    Hypertension    PONV (postoperative nausea and vomiting)    Past Surgical History:  Procedure Laterality Date   APPENDECTOMY  1976   BREAST BIOPSY Left 10/03/2014   Dr. Oleta Mouse   BREAST EXCISIONAL BIOPSY Left    BREAST LUMPECTOMY WITH RADIOACTIVE SEED LOCALIZATION Left 11/21/2014   Procedure: LEFT BREAST LUMPECTOMY WITH RADIOACTIVE SEED LOCALIZATION;  Surgeon: Chevis Pretty III, MD;  Location: [facility];  Service: General;  Laterality: Left;   CERVICAL BIOPSY  W/ LOOP ELECTRODE EXCISION     CHOLECYSTECTOMY  06/29/2007   LIVER BIOPSY  2008   TOTAL HIP ARTHROPLASTY  Left 09/2022   TUBAL LIGATION  1987   Family History  Problem Relation Age of Onset   Hypertension Mother    Pancreatitis Mother    Thyroid disease Mother    Hypertension Father    Heart attack Father    Cerebrovascular Accident Father    Coronary artery disease Father    Glaucoma Father    Cancer Father        lung   Parkinson's disease Maternal Grandmother    Breast cancer Neg Hx    Colon cancer Neg Hx    Ovarian cancer Neg Hx    Social History   Socioeconomic History   Marital  status: Married    Spouse name: Leonette Most   Number of children: 2   Years of education: 14   Highest education level: Associate degree: academic program  Occupational History   Occupation: Cytogeneticist in [place]    Comment: Part Time  Tobacco Use   Smoking status: Former    Current packs/day: 0.00    Average packs/day: 1.5 packs/day for 37.0 years (55.5 ttl pk-yrs)    Types: Cigarettes    Start date: 48    Quit date: 2009    Years since quitting: 16.1   Smokeless tobacco: Never  Vaping Use   Vaping status: Never Used  Substance and Sexual Activity   Alcohol use: Yes    Alcohol/week: 4.0 standard drinks of alcohol    Types: 4 Glasses of wine per week    Comment: 2 glasses of wine twice a week   Drug use: No   Sexual activity: Not Currently  Other Topics Concern   Not on file  Social History Narrative   Works 15 hours per week   Social Drivers of Corporate investment banker Strain: Low Risk  (07/05/2023)   Overall Financial Resource Strain (CARDIA)    Difficulty of Paying Living Expenses: Not hard at all  Food Insecurity: No Food Insecurity (07/05/2023)   Hunger Vital Sign    Worried About Running Out of Food in the Last Year: Never true    Ran Out of Food in the Last Year: Never true  Transportation Needs: No Transportation Needs (07/05/2023)   PRAPARE - Administrator, Civil Service (Medical): No    Lack of Transportation (Non-Medical): No  Physical Activity: Insufficiently Active (07/05/2023)   Exercise Vital Sign    Days of Exercise per Week: 7 days    Minutes of Exercise per Session: 20 min  Stress: No Stress Concern Present (07/05/2023)   [employer] of Occupational Health - Occupational Stress Questionnaire    Feeling of Stress : Not at all  Social Connections: Moderately Isolated (07/05/2023)   Social Connection and Isolation Panel [NHANES]    Frequency of Communication with Friends and Family: Three times a week    Frequency of Social  Gatherings with Friends and Family: Twice a week    Attends Religious Services: Never    Database administrator or Organizations: No    Attends Engineer, structural: Never    Marital Status: Married    Tobacco Counseling Counseling given: Not Answered    Clinical Intake:  Pre-visit preparation completed: Yes  Pain : No/denies pain     BMI - recorded: 32.89 Nutritional Status: BMI > 30  Obese Nutritional Risks: None Diabetes: No  How often do you need to have someone help you when you read instructions, pamphlets, or other written materials from your doctor or  pharmacy?: 1 - Never  Interpreter Needed?: No  Information entered by :: Tora Kindred, CMA   Activities of Daily Living     07/05/2023    2:32 PM 07/02/2023    9:12 AM  In your present state of health, do you have any difficulty performing the following activities:  Hearing? 0 0  Vision? 0 0  Difficulty concentrating or making decisions? 0 0  Walking or climbing stairs? 0 0  Dressing or bathing? 0 0  Doing errands, shopping? 0 0  Preparing Food and eating ? N N  Using the Toilet? N N  In the past six months, have you accidently leaked urine? N N  Do you have problems with loss of bowel control? N N  Managing your Medications? N N  Managing your Finances? N N  Housekeeping or managing your Housekeeping? N N    Patient Care Team: Larae Grooms, NP as PCP - General (Nurse Practitioner) [place] Ort, [facility])  Indicate any recent Medical Services you may have received from other than [facility] providers in the past year (date may be approximate).     Assessment:   This is a routine wellness examination for Michaela Hardy.  Hearing/Vision screen Hearing Screening - Comments:: Denies hearing loss Vision Screening - Comments:: Gets eye exams, Dr. Julianne Rice Bassett   Goals Addressed             This Visit's Progress    Patient Stated       Eat healthier       Depression Screen      07/05/2023    2:39 PM 05/23/2023    1:20 PM 04/13/2023   11:29 AM 03/07/2023    2:04 PM 01/21/2023   11:04 AM 06/30/2022    9:32 AM 05/17/2022    9:49 AM  PHQ 2/9 Scores  PHQ - 2 Score 0 0 0 0 0 0 0  PHQ- 9 Score 0 0 0  0 0 0    Fall Risk     07/05/2023    2:43 PM 07/02/2023    9:12 AM 03/07/2023    2:04 PM 01/21/2023   11:03 AM 09/02/2022    2:22 PM  Fall Risk   Falls in the past year? 0 0 0 0 0  Number falls in past yr: 0 0 0 0 0  Injury with Fall? 0 0 0 0 0  Risk for fall due to : No Fall Risks  No Fall Risks No Fall Risks No Fall Risks  Follow up Falls prevention discussed;Falls evaluation completed  Falls evaluation completed Falls evaluation completed Falls evaluation completed    MEDICARE RISK AT HOME:  Medicare Risk at Home Any stairs in or around the home?: Yes If so, are there any without handrails?: No Home free of loose throw rugs in walkways, pet beds, electrical cords, etc?: Yes Adequate lighting in your home to reduce risk of falls?: Yes Life alert?: No Use of a cane, walker or w/c?: No Grab bars in the bathroom?: No Shower chair or bench in shower?: Yes Elevated toilet seat or a handicapped toilet?: No  TIMED UP AND GO:  Was the test performed?  No  Cognitive Function: 6CIT completed        07/05/2023    2:44 PM  6CIT Screen  What Year? 0 points  What month? 0 points  What time? 0 points  Count back from 20 0 points  Months in reverse 0 points  Repeat phrase  0 points  Total Score 0 points    Immunizations Immunization History  Administered Date(s) Administered   Hepatitis A 11/27/2008, 05/30/2009   Hepatitis B 11/27/2008, 12/26/2008, 05/30/2009, 01/22/2016   Hepatitis B, ADULT 01/22/2016   Influenza Inj Mdck Quad Pf 02/26/2017   Influenza,inj,Quad PF,6+ Mos 02/07/2019, 03/05/2020, 02/07/2021, 01/20/2023   Influenza-Unspecified 01/31/2018, 01/23/2022   PFIZER(Purple Top)SARS-COV-2 Vaccination 07/27/2019, 08/17/2019, 04/30/2020, 04/04/2021,  03/13/2022   PNEUMOCOCCAL CONJUGATE-20 01/23/2022   Pneumococcal Conjugate-13 06/26/2021   Td 12/03/1998   Tdap 06/29/2012, 06/04/2016   Unspecified SARS-COV-2 Vaccination 02/08/2023   Zoster Recombinant(Shingrix) 03/27/2019, 05/25/2019    Screening Tests Health Maintenance  Topic Date Due   Medicare Annual Wellness (AWV)  07/04/2024   MAMMOGRAM  03/28/2025   DTaP/Tdap/Td (4 - Td or Tdap) 06/04/2026   Colonoscopy  06/22/2027   Pneumonia Vaccine 61+ Years old  Completed   INFLUENZA VACCINE  Completed   DEXA SCAN  Completed   COVID-19 Vaccine  Completed   Hepatitis C Screening  Completed   Zoster Vaccines- Shingrix  Completed   HPV VACCINES  Aged Out    Health Maintenance  There are no preventive care reminders to display for this patient. Health Maintenance Items Addressed: DEXA ordered  Additional Screening:  Vision Screening: Recommended annual ophthalmology exams for early detection of glaucoma and other disorders of the eye.  Dental Screening: Recommended annual dental exams for proper oral hygiene  Community Resource Referral / Chronic Care Management: CRR required this visit?  No   CCM required this visit?  No     Plan:     I have personally reviewed and noted the following in the patient's chart:   Medical and social history Use of alcohol, tobacco or illicit drugs  Current medications and supplements including opioid prescriptions. Patient is not currently taking opioid prescriptions. Functional ability and status Nutritional status Physical activity Advanced directives List of other physicians Hospitalizations, surgeries, and ER visits in previous 12 months Vitals Screenings to include cognitive, depression, and falls Referrals and appointments  In addition, I have reviewed and discussed with patient certain preventive protocols, quality metrics, and best practice recommendations. A written personalized care plan for preventive services as well as  general preventive health recommendations were provided to patient.     Tora Kindred, CMA   07/05/2023   After Visit Summary: (MyChart) Due to this being a telephonic visit, the after visit summary with patients personalized plan was offered to patient via MyChart   Notes:  MMG due 03/28/24 (declined order) Placed order for a DEXA scan

## 2023-07-05 NOTE — Patient Instructions (Signed)
 Ms. Michaela Hardy , Thank you for taking time to come for your Medicare Wellness Visit. I appreciate your ongoing commitment to your health goals. Please review the following plan we discussed and let me know if I can assist you in the future.   Referrals/Orders/Follow-Ups/Clinician Recommendations: You will be due for a mammogram 03/28/24. I have placed an order for a bone density test. Call [facility] to schedule @ [phone].  This is a list of the screening recommended for you and due dates:  Health Maintenance  Topic Date Due   DEXA scan (bone density measurement)  04/17/2019   Mammogram  03/28/2024   Medicare Annual Wellness Visit  07/04/2024   DTaP/Tdap/Td vaccine (4 - Td or Tdap) 06/04/2026   Colon Cancer Screening  06/22/2027   Pneumonia Vaccine  Completed   Flu Shot  Completed   COVID-19 Vaccine  Completed   Hepatitis C Screening  Completed   Zoster (Shingles) Vaccine  Completed   HPV Vaccine  Aged Out    Advanced directives: (Declined) Advance directive discussed with you today. Even though you declined this today, please call our office should you change your mind, and we can give you the proper paperwork for you to fill out.  Next Medicare Annual Wellness Visit scheduled for next year: Yes, 07/10/24 @ 2:30pm (video visit)

## 2023-08-15 ENCOUNTER — Ambulatory Visit
Admission: RE | Admit: 2023-08-15 | Discharge: 2023-08-15 | Disposition: A | Discharge status: Home / Self Care | Payer: Medicare Other | Source: Ambulatory Visit | Attending: Nurse Practitioner | Admitting: Nurse Practitioner

## 2023-08-15 ENCOUNTER — Encounter: Payer: Self-pay | Admitting: Nurse Practitioner

## 2023-08-15 DIAGNOSIS — Z780 Asymptomatic menopausal state: Secondary | ICD-10-CM | POA: Insufficient documentation

## 2023-08-24 ENCOUNTER — Encounter: Payer: Self-pay | Admitting: Nurse Practitioner

## 2023-08-24 ENCOUNTER — Ambulatory Visit (INDEPENDENT_AMBULATORY_CARE_PROVIDER_SITE_OTHER): Payer: Self-pay | Admitting: Nurse Practitioner

## 2023-08-24 VITALS — BP 125/74 | HR 78 | Temp 98.4°F | Resp 16 | Ht 67.01 in | Wt 217.0 lb

## 2023-08-24 DIAGNOSIS — R7303 Prediabetes: Secondary | ICD-10-CM | POA: Diagnosis not present

## 2023-08-24 DIAGNOSIS — E7800 Pure hypercholesterolemia, unspecified: Secondary | ICD-10-CM | POA: Diagnosis not present

## 2023-08-24 DIAGNOSIS — K743 Primary biliary cirrhosis: Secondary | ICD-10-CM

## 2023-08-24 DIAGNOSIS — I10 Essential (primary) hypertension: Secondary | ICD-10-CM | POA: Diagnosis not present

## 2023-08-24 NOTE — Assessment & Plan Note (Signed)
 Chronic.  Followed by GI.  Recent US  and lab work were unremarkable.   Continue to follow up with Specialist.

## 2023-08-24 NOTE — Assessment & Plan Note (Signed)
Chronic.  Controlled.  Continue with current medication regimen on Crestor 10mg .  Labs ordered today.  Return to clinic in 6 months for reevaluation.  Call sooner if concerns arise.

## 2023-08-24 NOTE — Progress Notes (Signed)
 BP 125/74 (BP Location: Right Arm, Patient Position: Sitting, Cuff Size: Normal)   Pulse 78   Temp 98.4 F (36.9 C) (Oral)   Resp 16   Ht 5' 7.01" (1.702 m)   Wt 217 lb (98.4 kg)   SpO2 98%   BMI 33.98 kg/m    Subjective:    Patient ID: Michaela Hardy, female    DOB: November 19, 1955, 68 y.o.   MRN: [id number]  HPI: Michaela Hardy is a 68 y.o. female  Chief Complaint  Patient presents with   Hypertension    Couple times a week checks at home. 132/88 on average.    Hip Pain    Did have cortisone injection. Left side s/p total hip 09/2022.    HYPERTENSION / HYPERLIPIDEMIA Patient states she is doing well.  She has been more stressed lately which is likely why her blood pressure is up. Satisfied with current treatment? yes Duration of hypertension: years BP monitoring frequency: a couple of times a week BP range: 120-130/80 BP medication side effects: no Past BP meds: losartan and HCTZ Duration of hyperlipidemia: years Cholesterol medication side effects: no Cholesterol supplements: none Past cholesterol medications: rosuvastatin (crestor) Medication compliance: excellent compliance Aspirin: no Recent stressors: no Recurrent headaches: no Visual changes: no Palpitations: no Dyspnea: no Chest pain: no Lower extremity edema: no Dizzy/lightheaded: no   Relevant past medical, surgical, family and social history reviewed and updated as indicated. Interim medical history since our last visit reviewed. Allergies and medications reviewed and updated.  Review of Systems  Eyes:  Negative for visual disturbance.  Respiratory:  Negative for cough, chest tightness and shortness of breath.   Cardiovascular:  Negative for chest pain, palpitations and leg swelling.  Neurological:  Negative for dizziness and headaches.  Psychiatric/Behavioral:  Negative for dysphoric mood and suicidal ideas. The patient is not nervous/anxious.     Per HPI unless specifically indicated  above     Objective:    BP 125/74 (BP Location: Right Arm, Patient Position: Sitting, Cuff Size: Normal)   Pulse 78   Temp 98.4 F (36.9 C) (Oral)   Resp 16   Ht 5' 7.01" (1.702 m)   Wt 217 lb (98.4 kg)   SpO2 98%   BMI 33.98 kg/m   Wt Readings from Last 3 Encounters:  08/24/23 217 lb (98.4 kg)  07/05/23 210 lb (95.3 kg)  05/23/23 208 lb 12.8 oz (94.7 kg)    Physical Exam Vitals and nursing note reviewed.  Constitutional:      General: She is not in acute distress.    Appearance: Normal appearance. She is obese. She is not ill-appearing, toxic-appearing or diaphoretic.  HENT:     Head: Normocephalic.     Right Ear: External ear normal.     Left Ear: External ear normal.     Nose: Nose normal.     Mouth/Throat:     Mouth: Mucous membranes are moist.     Pharynx: Oropharynx is clear.  Eyes:     General:        Right eye: No discharge.        Left eye: No discharge.     Extraocular Movements: Extraocular movements intact.     Conjunctiva/sclera: Conjunctivae normal.     Pupils: Pupils are equal, round, and reactive to light.  Cardiovascular:     Rate and Rhythm: Normal rate and regular rhythm.     Heart sounds: No murmur heard. Pulmonary:     Effort: Pulmonary  effort is normal. No respiratory distress.     Breath sounds: Normal breath sounds. No wheezing or rales.  Musculoskeletal:     Cervical back: Normal range of motion and neck supple.  Skin:    General: Skin is warm and dry.     Capillary Refill: Capillary refill takes less than 2 seconds.  Neurological:     General: No focal deficit present.     Mental Status: She is alert and oriented to person, place, and time. Mental status is at baseline.  Psychiatric:        Mood and Affect: Mood normal.        Behavior: Behavior normal.        Thought Content: Thought content normal.        Judgment: Judgment normal.     Results for orders placed or performed in visit on 03/07/23  Comp Met (CMET)   Collection  Time: 03/07/23  2:25 PM  Result Value Ref Range   Glucose 98 70 - 99 mg/dL   BUN 12 8 - 27 mg/dL   Creatinine, Ser 1.61 0.57 - 1.00 mg/dL   eGFR 98 >09 UE/AVW/0.98   BUN/Creatinine Ratio 19 12 - 28   Sodium 140 134 - 144 mmol/L   Potassium 4.2 3.5 - 5.2 mmol/L   Chloride 97 96 - 106 mmol/L   CO2 24 20 - 29 mmol/L   Calcium 9.7 8.7 - 10.3 mg/dL   Total Protein 7.6 6.0 - 8.5 g/dL   Albumin 4.8 3.9 - 4.9 g/dL   Globulin, Total 2.8 1.5 - 4.5 g/dL   Bilirubin Total 0.4 0.0 - 1.2 mg/dL   Alkaline Phosphatase 184 (H) 44 - 121 IU/L   AST 28 0 - 40 IU/L   ALT 36 (H) 0 - 32 IU/L  Lipid Profile   Collection Time: 03/07/23  2:25 PM  Result Value Ref Range   Cholesterol, Total 194 100 - 199 mg/dL   Triglycerides 119 0 - 149 mg/dL   HDL 66 >14 mg/dL   VLDL Cholesterol Cal 24 5 - 40 mg/dL   LDL Chol Calc (NIH) 782 (H) 0 - 99 mg/dL   Chol/HDL Ratio 2.9 0.0 - 4.4 ratio  HgB A1c   Collection Time: 03/07/23  2:25 PM  Result Value Ref Range   Hgb A1c MFr Bld 6.3 (H) 4.8 - 5.6 %   Est. average glucose Bld gHb Est-mCnc 134 mg/dL      Assessment & Plan:   Problem List Items Addressed This Visit       Cardiovascular and Mediastinum   Essential hypertension   Chronic.  Controlled.  Continue with current medication regimen of Amlodipine, Telmisartan, hydrochlorothiazide, and hydralazine.  Checking blood pressures at home a couple of days a week.  Labs ordered today.  Return to clinic in 6 months for reevaluation.  Call sooner if concerns arise.       Relevant Orders   Comprehensive metabolic panel with GFR     Digestive   Primary biliary cholangitis (HCC) - Primary   Chronic.  Followed by GI.  Recent US and lab work were unremarkable.   Continue to follow up with Specialist.        Other   Hypercholesteremia   Chronic.  Controlled.  Continue with current medication regimen on Crestor 10mg .  Labs ordered today.  Return to clinic in 6 months for reevaluation.  Call sooner if concerns  arise.        Relevant Orders  Lipid panel   Prediabetes   Labs ordered at visit today.  Will make recommendations based on lab results.        Relevant Orders   Hemoglobin A1c      Follow up plan: No follow-ups on file.

## 2023-08-24 NOTE — Assessment & Plan Note (Signed)
 Labs ordered at visit today.  Will make recommendations based on lab results.

## 2023-08-24 NOTE — Assessment & Plan Note (Signed)
 Chronic.  Controlled.  Continue with current medication regimen of Amlodipine, Telmisartan, hydrochlorothiazide, and hydralazine.  Checking blood pressures at home a couple of days a week.  Labs ordered today.  Return to clinic in 6 months for reevaluation.  Call sooner if concerns arise.

## 2023-08-25 ENCOUNTER — Encounter: Payer: Self-pay | Admitting: Nurse Practitioner

## 2023-08-25 LAB — LIPID PANEL
Chol/HDL Ratio: 2.4 ratio (ref 0.0–4.4)
Cholesterol, Total: 187 mg/dL (ref 100–199)
HDL: 79 mg/dL (ref >39)
LDL Chol Calc (NIH): 94 mg/dL (ref 0–99)
Triglycerides: 74 mg/dL (ref 0–149)
VLDL Cholesterol Cal: 14 mg/dL (ref 5–40)

## 2023-08-25 LAB — COMPREHENSIVE METABOLIC PANEL WITH GFR
ALT: 37 IU/L — ABNORMAL HIGH (ref 0–32)
AST: 33 IU/L (ref 0–40)
Albumin: 4.5 g/dL (ref 3.9–4.9)
Alkaline Phosphatase: 158 IU/L — ABNORMAL HIGH (ref 44–121)
BUN/Creatinine Ratio: 19 (ref 12–28)
BUN: 22 mg/dL (ref 8–27)
Bilirubin Total: 0.3 mg/dL (ref 0.0–1.2)
CO2: 19 mmol/L — ABNORMAL LOW (ref 20–29)
Calcium: 9.5 mg/dL (ref 8.7–10.3)
Chloride: 96 mmol/L (ref 96–106)
Creatinine, Ser: 1.14 mg/dL — ABNORMAL HIGH (ref 0.57–1.00)
Globulin, Total: 2.7 g/dL (ref 1.5–4.5)
Glucose: 202 mg/dL — ABNORMAL HIGH (ref 70–99)
Potassium: 5 mmol/L (ref 3.5–5.2)
Sodium: 134 mmol/L (ref 134–144)
Total Protein: 7.2 g/dL (ref 6.0–8.5)
eGFR: 53 mL/min/{1.73_m2} — ABNORMAL LOW (ref >59)

## 2023-08-25 LAB — HEMOGLOBIN A1C
Est. average glucose Bld gHb Est-mCnc: 131 mg/dL
Hgb A1c MFr Bld: 6.2 % — ABNORMAL HIGH (ref 4.8–5.6)

## 2023-09-28 ENCOUNTER — Ambulatory Visit: Payer: Self-pay

## 2023-09-28 NOTE — Telephone Encounter (Signed)
  Chief Complaint: dizzy Symptoms: reports dizziness on standing  Frequency: last night Pertinent Negatives: Patient denies sob, cp, s/s infection Disposition: [] ED /[] Urgent Care (no appt availability in office) / [] Appointment(In office/virtual)/ []  [facility]/ [] Home Care/ [] Refused Recommended Disposition /[] [facility]/ [x]  Follow-up with PCP Additional Notes: appointment scheduled, reportable s/s discussed, patient verbalizes understanding Copied from CRM [id number]. Topic: Clinical - Red Word Triage >> Sep 28, 2023  2:17 PM Turkey B wrote: [facility] that prompted transfer to Nurse Triage: pt has dizziness Reason for Disposition  [1] MILD dizziness (e.g., walking normally) AND [2] has NOT been evaluated by doctor (or NP/PA) for this  (Exception: Dizziness caused by heat exposure, sudden standing, or poor fluid intake.)  Answer Assessment - Initial Assessment Questions 1. DESCRIPTION: "Describe your dizziness."     Dizzy when standing after sitting 2. LIGHTHEADED: "Do you feel lightheaded?" (e.g., somewhat faint, woozy, weak upon standing)     Yes, when it happens 3. VERTIGO: "Do you feel like either you or the room is spinning or tilting?" (i.e. vertigo)     no 4. SEVERITY: "How bad is it?"  "Do you feel like you are going to faint?" "Can you stand and walk?"   - MILD: Feels slightly dizzy, but walking normally.   - MODERATE: Feels unsteady when walking, but not falling; interferes with normal activities (e.g., school, work).   - SEVERE: Unable to walk without falling, or requires assistance to walk without falling; feels like passing out now.      mild 5. ONSET:  "When did the dizziness begin?"     One month ago 6. AGGRAVATING FACTORS: "Does anything make it worse?" (e.g., standing, change in head position)     Changing position, sit to stand, increased stress 7. HEART RATE: "Can you tell me your heart rate?" "How many beats in 15 seconds?"  (Note: not all  patients can do this)       Not able 8. CAUSE: "What do you think is causing the dizziness?"     Position changes 9. RECURRENT SYMPTOM: "Have you had dizziness before?" If Yes, ask: "When was the last time?" "What happened that time?"     no 10. OTHER SYMPTOMS: "Do you have any other symptoms?" (e.g., fever, chest pain, vomiting, diarrhea, bleeding)       no  Protocols used: Dizziness - Lightheadedness-A-AH

## 2023-09-30 ENCOUNTER — Encounter: Payer: Self-pay | Admitting: Family Medicine

## 2023-09-30 ENCOUNTER — Ambulatory Visit (INDEPENDENT_AMBULATORY_CARE_PROVIDER_SITE_OTHER): Admitting: Family Medicine

## 2023-09-30 VITALS — BP 131/66 | HR 79 | Temp 99.1°F | Resp 15 | Ht 67.01 in | Wt 216.8 lb

## 2023-09-30 DIAGNOSIS — R011 Cardiac murmur, unspecified: Secondary | ICD-10-CM

## 2023-09-30 DIAGNOSIS — R42 Dizziness and giddiness: Secondary | ICD-10-CM | POA: Diagnosis not present

## 2023-09-30 DIAGNOSIS — R5383 Other fatigue: Secondary | ICD-10-CM | POA: Diagnosis not present

## 2023-09-30 DIAGNOSIS — E559 Vitamin D deficiency, unspecified: Secondary | ICD-10-CM

## 2023-09-30 LAB — BAYER DCA HB A1C WAIVED: HB A1C (BAYER DCA - WAIVED): 6.1 % — ABNORMAL HIGH (ref 4.8–5.6)

## 2023-09-30 NOTE — Progress Notes (Signed)
 BP 131/66 (BP Location: Left Arm, Patient Position: Sitting, Cuff Size: Large)   Pulse 79   Temp 99.1 F (37.3 C) (Oral)   Resp 15   Ht 5' 7.01" (1.702 m)   Wt 216 lb 12.8 oz (98.3 kg)   SpO2 97%   BMI 33.95 kg/m    Subjective:    Patient ID: Percival Brace, female    DOB: 09-Aug-1955, 68 y.o.   MRN: [id number]  HPI: ANNASTASIA HASKINS is a 68 y.o. female  Chief Complaint  Patient presents with   Dizziness    Ongoing times 1 month with leg/feet cramps starting at the same time. Wonders if related to stress with her aging parents.    DIZZINESS Duration: about a month ago Description of symptoms: lightheaded Duration of episode: couple of minutes Dizziness frequency: recurrent, at least 1x a day Provoking factors: going from sitting to standing Triggered by rolling over in bed: no Triggered by bending over: no Aggravated by head movement: no Aggravated by exertion, coughing, loud noises: no Recent head injury: no Recent or current viral symptoms: no History of vasovagal episodes: no Nausea: no Vomiting: no Tinnitus: no Hearing loss: no Aural fullness: no Headache: no Photophobia/phonophobia: no Unsteady gait: no Postural instability: no Diplopia, dysarthria, dysphagia or weakness: no Related to exertion: no Pallor: no Diaphoresis: no Dyspnea: no Chest pain: no   Relevant past medical, surgical, family and social history reviewed and updated as indicated. Interim medical history since our last visit reviewed. Allergies and medications reviewed and updated.  Review of Systems  Constitutional:  Positive for fatigue. Negative for activity change, appetite change, chills, diaphoresis, fever and unexpected weight change.  Respiratory: Negative.    Cardiovascular: Negative.   Musculoskeletal:  Positive for back pain and myalgias. Negative for arthralgias, gait problem, joint swelling, neck pain and neck stiffness.  Skin: Negative.   Neurological:  Positive for  dizziness, light-headedness and numbness. Negative for tremors, seizures, syncope, facial asymmetry, speech difficulty, weakness and headaches.  Psychiatric/Behavioral:  Negative for agitation, behavioral problems, confusion, decreased concentration, dysphoric mood, hallucinations, self-injury, sleep disturbance and suicidal ideas. The patient is nervous/anxious. The patient is not hyperactive.     Per HPI unless specifically indicated above     Objective:     BP 131/66 (BP Location: Left Arm, Patient Position: Sitting, Cuff Size: Large)   Pulse 79   Temp 99.1 F (37.3 C) (Oral)   Resp 15   Ht 5' 7.01" (1.702 m)   Wt 216 lb 12.8 oz (98.3 kg)   SpO2 97%   BMI 33.95 kg/m   Wt Readings from Last 3 Encounters:  09/30/23 216 lb 12.8 oz (98.3 kg)  08/24/23 217 lb (98.4 kg)  07/05/23 210 lb (95.3 kg)    Physical Exam Vitals and nursing note reviewed.  Constitutional:      General: She is not in acute distress.    Appearance: Normal appearance. She is not ill-appearing, toxic-appearing or diaphoretic.  HENT:     Head: Normocephalic and atraumatic.     Right Ear: Tympanic membrane, ear canal and external ear normal. There is no impacted cerumen.     Left Ear: Tympanic membrane, ear canal and external ear normal. There is no impacted cerumen.     Nose: Nose normal. No congestion or rhinorrhea.     Mouth/Throat:     Mouth: Mucous membranes are moist.     Pharynx: Oropharynx is clear. No oropharyngeal exudate or posterior oropharyngeal erythema.  Eyes:  General: No scleral icterus.       Right eye: No discharge.        Left eye: No discharge.     Extraocular Movements: Extraocular movements intact.     Left eye: Nystagmus present.     Conjunctiva/sclera: Conjunctivae normal.     Pupils: Pupils are equal, round, and reactive to light.  Cardiovascular:     Rate and Rhythm: Normal rate and regular rhythm.     Pulses: Normal pulses.     Heart sounds: Murmur heard.     No friction  rub. No gallop.  Pulmonary:     Effort: Pulmonary effort is normal. No respiratory distress.     Breath sounds: Normal breath sounds. No stridor. No wheezing, rhonchi or rales.  Chest:     Chest wall: No tenderness.  Musculoskeletal:        General: Normal range of motion.     Cervical back: Normal range of motion and neck supple.  Skin:    General: Skin is warm and dry.     Capillary Refill: Capillary refill takes less than 2 seconds.     Coloration: Skin is not jaundiced or pale.     Findings: No bruising, erythema, lesion or rash.  Neurological:     General: No focal deficit present.     Mental Status: She is alert and oriented to person, place, and time. Mental status is at baseline.  Psychiatric:        Mood and Affect: Mood normal.        Behavior: Behavior normal.        Thought Content: Thought content normal.        Judgment: Judgment normal.     Results for orders placed or performed in visit on 08/24/23  Hemoglobin A1c   Collection Time: 08/24/23  2:41 PM  Result Value Ref Range   Hgb A1c MFr Bld 6.2 (H) 4.8 - 5.6 %   Est. average glucose Bld gHb Est-mCnc 131 mg/dL  Comprehensive metabolic panel with GFR   Collection Time: 08/24/23  2:41 PM  Result Value Ref Range   Glucose 202 (H) 70 - 99 mg/dL   BUN 22 8 - 27 mg/dL   Creatinine, Ser 1.61 (H) 0.57 - 1.00 mg/dL   eGFR 53 (L) >09 UE/AVW/0.98   BUN/Creatinine Ratio 19 12 - 28   Sodium 134 134 - 144 mmol/L   Potassium 5.0 3.5 - 5.2 mmol/L   Chloride 96 96 - 106 mmol/L   CO2 19 (L) 20 - 29 mmol/L   Calcium  9.5 8.7 - 10.3 mg/dL   Total Protein 7.2 6.0 - 8.5 g/dL   Albumin 4.5 3.9 - 4.9 g/dL   Globulin, Total 2.7 1.5 - 4.5 g/dL   Bilirubin Total 0.3 0.0 - 1.2 mg/dL   Alkaline Phosphatase 158 (H) 44 - 121 IU/L   AST 33 0 - 40 IU/L   ALT 37 (H) 0 - 32 IU/L  Lipid panel   Collection Time: 08/24/23  2:41 PM  Result Value Ref Range   Cholesterol, Total 187 100 - 199 mg/dL   Triglycerides 74 0 - 149 mg/dL   HDL  79 >11 mg/dL   VLDL Cholesterol Cal 14 5 - 40 mg/dL   LDL Chol Calc (NIH) 94 0 - 99 mg/dL   Chol/HDL Ratio 2.4 0.0 - 4.4 ratio      Assessment & Plan:   Problem List Items Addressed This Visit   None Visit Diagnoses  Dizziness    -  Primary   + nystagmus. ? Vertigo- will start epley's manuever. Not orthostatic. Will check labs and check ECHO. Await results.   Relevant Orders   Bayer DCA Hb A1c Waived   CBC with Differential/Platelet   Comprehensive metabolic panel with GFR     Murmur, cardiac       Has not had a murmur in the past. More fatigued. Will check ECHO. Await results.   Relevant Orders   ECHOCARDIOGRAM LIMITED     Other fatigue       Checking labs and ECHO. Await results.   Relevant Orders   TSH     Vitamin D  deficiency       Will check labs. Await results.   Relevant Orders   VITAMIN D  25 Hydroxy (Vit-D Deficiency, Fractures)        Follow up plan: Return 2-3 weeks, for with Mariah Shines.

## 2023-10-01 LAB — CBC WITH DIFFERENTIAL/PLATELET
Basophils Absolute: 0.1 10*3/uL (ref 0.0–0.2)
Basos: 1 %
EOS (ABSOLUTE): 0 10*3/uL (ref 0.0–0.4)
Eos: 0 %
Hematocrit: 38.7 % (ref 34.0–46.6)
Hemoglobin: 12.2 g/dL (ref 11.1–15.9)
Immature Grans (Abs): 0 10*3/uL (ref 0.0–0.1)
Immature Granulocytes: 0 %
Lymphocytes Absolute: 1.5 10*3/uL (ref 0.7–3.1)
Lymphs: 19 %
MCH: 29.8 pg (ref 26.6–33.0)
MCHC: 31.5 g/dL (ref 31.5–35.7)
MCV: 94 fL (ref 79–97)
Monocytes Absolute: 0.7 10*3/uL (ref 0.1–0.9)
Monocytes: 8 %
Neutrophils Absolute: 5.7 10*3/uL (ref 1.4–7.0)
Neutrophils: 72 %
Platelets: 444 10*3/uL (ref 150–450)
RBC: 4.1 x10E6/uL (ref 3.77–5.28)
RDW: 13.2 % (ref 11.7–15.4)
WBC: 8 10*3/uL (ref 3.4–10.8)

## 2023-10-01 LAB — COMPREHENSIVE METABOLIC PANEL WITH GFR
ALT: 46 IU/L — ABNORMAL HIGH (ref 0–32)
AST: 29 IU/L (ref 0–40)
Albumin: 4.3 g/dL (ref 3.9–4.9)
Alkaline Phosphatase: 180 IU/L — ABNORMAL HIGH (ref 44–121)
BUN/Creatinine Ratio: 17 (ref 12–28)
BUN: 12 mg/dL (ref 8–27)
Bilirubin Total: <0.2 mg/dL (ref 0.0–1.2)
CO2: 24 mmol/L (ref 20–29)
Calcium: 9.1 mg/dL (ref 8.7–10.3)
Chloride: 97 mmol/L (ref 96–106)
Creatinine, Ser: 0.72 mg/dL (ref 0.57–1.00)
Globulin, Total: 2.5 g/dL (ref 1.5–4.5)
Glucose: 159 mg/dL — ABNORMAL HIGH (ref 70–99)
Potassium: 3.9 mmol/L (ref 3.5–5.2)
Sodium: 138 mmol/L (ref 134–144)
Total Protein: 6.8 g/dL (ref 6.0–8.5)
eGFR: 92 mL/min/{1.73_m2} (ref >59)

## 2023-10-01 LAB — VITAMIN D 25 HYDROXY (VIT D DEFICIENCY, FRACTURES): Vit D, 25-Hydroxy: 41.3 ng/mL (ref 30.0–100.0)

## 2023-10-01 LAB — TSH: TSH: 2.33 u[IU]/mL (ref 0.450–4.500)

## 2023-10-04 ENCOUNTER — Ambulatory Visit: Payer: Self-pay | Admitting: Family Medicine

## 2023-10-08 ENCOUNTER — Encounter: Payer: Self-pay | Admitting: Nurse Practitioner

## 2023-10-12 ENCOUNTER — Other Ambulatory Visit: Payer: Self-pay | Admitting: Nurse Practitioner

## 2023-10-13 NOTE — Telephone Encounter (Signed)
 Requested Prescriptions  Pending Prescriptions Disp Refills   telmisartan  (MICARDIS ) 80 MG tablet [Pharmacy Med Name: Telmisartan  80 MG Oral Tablet] 90 tablet 1    Sig: TAKE 1 TABLET BY MOUTH DAILY     Cardiovascular:  Angiotensin Receptor Blockers Passed - 10/13/2023  4:39 PM      Passed - Cr in normal range and within 180 days    Creatinine, Ser  Date Value Ref Range Status  09/30/2023 0.72 0.57 - 1.00 mg/dL Final         Passed - K in normal range and within 180 days    Potassium  Date Value Ref Range Status  09/30/2023 3.9 3.5 - 5.2 mmol/L Final         Passed - Patient is not pregnant      Passed - Last BP in normal range    BP Readings from Last 1 Encounters:  09/30/23 131/66         Passed - Valid encounter within last 6 months    Recent Outpatient Visits           1 week ago Dizziness   [facility], Megan P, DO   1 month ago Primary biliary cholangitis [facility])   [place] [facility] Aileen Alexanders, NP               hydrochlorothiazide  (HYDRODIURIL ) 25 MG tablet [Pharmacy Med Name: hydroCHLOROthiazide  25 MG Oral Tablet] 90 tablet 1    Sig: TAKE 1 TABLET BY MOUTH DAILY     Cardiovascular: Diuretics - Thiazide Passed - 10/13/2023  4:39 PM      Passed - Cr in normal range and within 180 days    Creatinine, Ser  Date Value Ref Range Status  09/30/2023 0.72 0.57 - 1.00 mg/dL Final         Passed - K in normal range and within 180 days    Potassium  Date Value Ref Range Status  09/30/2023 3.9 3.5 - 5.2 mmol/L Final         Passed - Na in normal range and within 180 days    Sodium  Date Value Ref Range Status  09/30/2023 138 134 - 144 mmol/L Final         Passed - Last BP in normal range    BP Readings from Last 1 Encounters:  09/30/23 131/66         Passed - Valid encounter within last 6 months    Recent Outpatient Visits           1 week ago Dizziness   [facility]  [place], Megan P, DO   1 month ago Primary biliary cholangitis [facility])    [facility] Aileen Alexanders, NP

## 2023-10-25 ENCOUNTER — Ambulatory Visit (INDEPENDENT_AMBULATORY_CARE_PROVIDER_SITE_OTHER): Admitting: Nurse Practitioner

## 2023-10-25 ENCOUNTER — Encounter: Payer: Self-pay | Admitting: Nurse Practitioner

## 2023-10-25 VITALS — BP 135/71 | Temp 99.1°F | Resp 15 | Ht 67.01 in | Wt 217.8 lb

## 2023-10-25 DIAGNOSIS — R42 Dizziness and giddiness: Secondary | ICD-10-CM | POA: Diagnosis not present

## 2023-10-25 NOTE — Progress Notes (Signed)
 BP 135/71 (BP Location: Left Arm, Patient Position: Sitting, Cuff Size: Large)   Temp 99.1 F (37.3 C) (Oral)   Resp 15   Ht 5' 7.01 (1.702 m)   Wt 217 lb 12.8 oz (98.8 kg)   SpO2 97%   BMI 34.10 kg/m    Subjective:    Patient ID: Michaela Hardy, female    DOB: December 24, 1955, 68 y.o.   MRN: [id number]  HPI: Michaela Hardy is a 68 y.o. female  Chief Complaint  Patient presents with   Dizziness    Epley maneuver has helped.    DIZZINESS Improved from prior.  However, being in the car is a trigger for her.  Symptoms have gotten much better.  She hasn't heard about her ECHO since her visit with Dr. Lincoln Renshaw.   Duration: about a month ago Description of symptoms: lightheaded Duration of episode: couple of minutes Dizziness frequency: recurrent, at least 1x a day Provoking factors: going from sitting to standing Triggered by rolling over in bed: no Triggered by bending over: no Aggravated by head movement: no Aggravated by exertion, coughing, loud noises: no Recent head injury: no Recent or current viral symptoms: no History of vasovagal episodes: no Nausea: no Vomiting: no Tinnitus: no Hearing loss: no Aural fullness: no Headache: no Photophobia/phonophobia: no Unsteady gait: no Postural instability: no Diplopia, dysarthria, dysphagia or weakness: no Related to exertion: no Pallor: no Diaphoresis: no Dyspnea: no Chest pain: no   Relevant past medical, surgical, family and social history reviewed and updated as indicated. Interim medical history since our last visit reviewed. Allergies and medications reviewed and updated.  Review of Systems  Constitutional:  Negative for activity change, appetite change, chills, diaphoresis, fatigue, fever and unexpected weight change.  Respiratory: Negative.    Cardiovascular: Negative.   Musculoskeletal:  Negative for arthralgias, back pain, gait problem, joint swelling, neck pain and neck stiffness.  Skin: Negative.    Neurological:  Positive for dizziness, light-headedness and numbness. Negative for tremors, seizures, syncope, facial asymmetry, speech difficulty, weakness and headaches.  Psychiatric/Behavioral:  Negative for agitation, behavioral problems, confusion, decreased concentration, dysphoric mood, hallucinations, self-injury, sleep disturbance and suicidal ideas. The patient is nervous/anxious. The patient is not hyperactive.     Per HPI unless specifically indicated above     Objective:     BP 135/71 (BP Location: Left Arm, Patient Position: Sitting, Cuff Size: Large)   Temp 99.1 F (37.3 C) (Oral)   Resp 15   Ht 5' 7.01 (1.702 m)   Wt 217 lb 12.8 oz (98.8 kg)   SpO2 97%   BMI 34.10 kg/m   Wt Readings from Last 3 Encounters:  10/25/23 217 lb 12.8 oz (98.8 kg)  09/30/23 216 lb 12.8 oz (98.3 kg)  08/24/23 217 lb (98.4 kg)    Physical Exam Vitals and nursing note reviewed.  Constitutional:      General: She is not in acute distress.    Appearance: Normal appearance. She is not ill-appearing, toxic-appearing or diaphoretic.  HENT:     Head: Normocephalic and atraumatic.     Right Ear: Tympanic membrane, ear canal and external ear normal. There is no impacted cerumen.     Left Ear: Tympanic membrane, ear canal and external ear normal. There is no impacted cerumen.     Nose: Nose normal. No congestion or rhinorrhea.     Mouth/Throat:     Mouth: Mucous membranes are moist.     Pharynx: Oropharynx is clear. No oropharyngeal exudate  or posterior oropharyngeal erythema.   Eyes:     General: No scleral icterus.       Right eye: No discharge.        Left eye: No discharge.     Extraocular Movements: Extraocular movements intact.     Left eye: Nystagmus present.     Conjunctiva/sclera: Conjunctivae normal.     Pupils: Pupils are equal, round, and reactive to light.    Cardiovascular:     Rate and Rhythm: Normal rate and regular rhythm.     Pulses: Normal pulses.     Heart  sounds: Murmur heard.     No friction rub. No gallop.  Pulmonary:     Effort: Pulmonary effort is normal. No respiratory distress.     Breath sounds: Normal breath sounds. No stridor. No wheezing, rhonchi or rales.  Chest:     Chest wall: No tenderness.   Musculoskeletal:        General: Normal range of motion.     Cervical back: Normal range of motion and neck supple.   Skin:    General: Skin is warm and dry.     Capillary Refill: Capillary refill takes less than 2 seconds.     Coloration: Skin is not jaundiced or pale.     Findings: No bruising, erythema, lesion or rash.   Neurological:     General: No focal deficit present.     Mental Status: She is alert and oriented to person, place, and time. Mental status is at baseline.   Psychiatric:        Mood and Affect: Mood normal.        Behavior: Behavior normal.        Thought Content: Thought content normal.        Judgment: Judgment normal.     Results for orders placed or performed in visit on 09/30/23  Bayer DCA Hb A1c Waived   Collection Time: 09/30/23  3:14 PM  Result Value Ref Range   HB A1C (BAYER DCA - WAIVED) 6.1 (H) 4.8 - 5.6 %  CBC with Differential/Platelet   Collection Time: 09/30/23  3:15 PM  Result Value Ref Range   WBC 8.0 3.4 - 10.8 x10E3/uL   RBC 4.10 3.77 - 5.28 x10E6/uL   Hemoglobin 12.2 11.1 - 15.9 g/dL   Hematocrit 65.7 84.6 - 46.6 %   MCV 94 79 - 97 fL   MCH 29.8 26.6 - 33.0 pg   MCHC 31.5 31.5 - 35.7 g/dL   RDW 96.2 95.2 - 84.1 %   Platelets 444 150 - 450 x10E3/uL   Neutrophils 72 Not Estab. %   Lymphs 19 Not Estab. %   Monocytes 8 Not Estab. %   Eos 0 Not Estab. %   Basos 1 Not Estab. %   Neutrophils Absolute 5.7 1.4 - 7.0 x10E3/uL   Lymphocytes Absolute 1.5 0.7 - 3.1 x10E3/uL   Monocytes Absolute 0.7 0.1 - 0.9 x10E3/uL   EOS (ABSOLUTE) 0.0 0.0 - 0.4 x10E3/uL   Basophils Absolute 0.1 0.0 - 0.2 x10E3/uL   Immature Granulocytes 0 Not Estab. %   Immature Grans (Abs) 0.0 0.0 - 0.1  x10E3/uL  Comprehensive metabolic panel with GFR   Collection Time: 09/30/23  3:15 PM  Result Value Ref Range   Glucose 159 (H) 70 - 99 mg/dL   BUN 12 8 - 27 mg/dL   Creatinine, Ser 3.24 0.57 - 1.00 mg/dL   eGFR 92 >40 NU/UVO/5.36   BUN/Creatinine Ratio 17  12 - 28   Sodium 138 134 - 144 mmol/L   Potassium 3.9 3.5 - 5.2 mmol/L   Chloride 97 96 - 106 mmol/L   CO2 24 20 - 29 mmol/L   Calcium  9.1 8.7 - 10.3 mg/dL   Total Protein 6.8 6.0 - 8.5 g/dL   Albumin 4.3 3.9 - 4.9 g/dL   Globulin, Total 2.5 1.5 - 4.5 g/dL   Bilirubin Total <1.9 0.0 - 1.2 mg/dL   Alkaline Phosphatase 180 (H) 44 - 121 IU/L   AST 29 0 - 40 IU/L   ALT 46 (H) 0 - 32 IU/L  TSH   Collection Time: 09/30/23  3:15 PM  Result Value Ref Range   TSH 2.330 0.450 - 4.500 uIU/mL  VITAMIN D  25 Hydroxy (Vit-D Deficiency, Fractures)   Collection Time: 09/30/23  3:15 PM  Result Value Ref Range   Vit D, 25-Hydroxy 41.3 30.0 - 100.0 ng/mL      Assessment & Plan:   Problem List Items Addressed This Visit   None Visit Diagnoses       Dizziness    -  Primary   Symptoms have improved. Continue with Epley manuver.  Has not heard about the ECHO. Will reach out to scheduling to make sure this gets taken care of.         Follow up plan: No follow-ups on file.

## 2023-10-26 ENCOUNTER — Other Ambulatory Visit: Payer: Self-pay

## 2023-10-26 DIAGNOSIS — R011 Cardiac murmur, unspecified: Secondary | ICD-10-CM

## 2023-12-13 ENCOUNTER — Other Ambulatory Visit: Payer: Self-pay | Admitting: Nurse Practitioner

## 2023-12-14 ENCOUNTER — Encounter: Payer: Self-pay | Admitting: Nurse Practitioner

## 2023-12-14 DIAGNOSIS — R42 Dizziness and giddiness: Secondary | ICD-10-CM

## 2023-12-14 NOTE — Telephone Encounter (Signed)
 Requested Prescriptions  Pending Prescriptions Disp Refills   hydrALAZINE  (APRESOLINE ) 10 MG tablet [Pharmacy Med Name: hydrALAZINE  HCl 10 MG Oral Tablet] 270 tablet 1    Sig: TAKE 1 TABLET BY MOUTH 3 TIMES  DAILY     Cardiovascular:  Vasodilators Failed - 12/14/2023  3:16 PM      Failed - ANA Screen, Ifa, Serum in normal range and within 360 days    No results found for: ANA, ANATITER, LABANTI       Passed - HCT in normal range and within 360 days    Hematocrit  Date Value Ref Range Status  09/30/2023 38.7 34.0 - 46.6 % Final         Passed - HGB in normal range and within 360 days    Hemoglobin  Date Value Ref Range Status  09/30/2023 12.2 11.1 - 15.9 g/dL Final         Passed - RBC in normal range and within 360 days    RBC  Date Value Ref Range Status  09/30/2023 4.10 3.77 - 5.28 x10E6/uL Final         Passed - WBC in normal range and within 360 days    WBC  Date Value Ref Range Status  09/30/2023 8.0 3.4 - 10.8 x10E3/uL Final         Passed - PLT in normal range and within 360 days    Platelets  Date Value Ref Range Status  09/30/2023 444 150 - 450 x10E3/uL Final         Passed - Last BP in normal range    BP Readings from Last 1 Encounters:  10/25/23 135/71         Passed - Valid encounter within last 12 months    Recent Outpatient Visits           1 month ago Dizziness   [place] [facility], Darice, NP   2 months ago Dizziness   [place] [facility] [place], [place] P, DO   3 months ago Primary biliary cholangitis [facility])   [place] [facility] Melvin Darice, NP               rosuvastatin  (CRESTOR ) 10 MG tablet [Pharmacy Med Name: Rosuvastatin  Calcium  10 MG Oral Tablet] 90 tablet 1    Sig: TAKE 1 TABLET BY MOUTH DAILY     Cardiovascular:  Antilipid - Statins 2 Failed - 12/14/2023  3:16 PM      Failed - Lipid Panel in normal range within the last 12 months    Cholesterol, Total   Date Value Ref Range Status  08/24/2023 187 100 - 199 mg/dL Final   LDL Chol Calc (NIH)  Date Value Ref Range Status  08/24/2023 94 0 - 99 mg/dL Final   HDL  Date Value Ref Range Status  08/24/2023 79 >39 mg/dL Final   Triglycerides  Date Value Ref Range Status  08/24/2023 74 0 - 149 mg/dL Final         Passed - Cr in normal range and within 360 days    Creatinine, Ser  Date Value Ref Range Status  09/30/2023 0.72 0.57 - 1.00 mg/dL Final         Passed - Patient is not pregnant      Passed - Valid encounter within last 12 months    Recent Outpatient Visits           1 month ago Dizziness   [facility] [name], NP   2 months ago Dizziness   [place] [facility] [place], [place] P, DO   3 months ago Primary biliary cholangitis [facility])   [place] [facility] [name], NP

## 2023-12-28 ENCOUNTER — Ambulatory Visit
Admission: RE | Admit: 2023-12-28 | Discharge: 2023-12-28 | Disposition: A | Discharge status: Home / Self Care | Source: Ambulatory Visit | Attending: Family Medicine | Admitting: Family Medicine

## 2023-12-28 DIAGNOSIS — R011 Cardiac murmur, unspecified: Secondary | ICD-10-CM | POA: Insufficient documentation

## 2023-12-28 LAB — ECHOCARDIOGRAM COMPLETE
AR max vel: 2.66 cm2
AV Area VTI: 2.8 cm2
AV Area mean vel: 2.71 cm2
AV Mean grad: 3 mmHg
AV Peak grad: 6.5 mmHg
Ao pk vel: 1.27 m/s
Area-P 1/2: 3.08 cm2
Calc EF: 55.4 %
MV VTI: 2.84 cm2
S' Lateral: 2.4 cm
Single Plane A2C EF: 53.2 %
Single Plane A4C EF: 57.1 %

## 2024-01-03 ENCOUNTER — Encounter: Payer: Self-pay | Admitting: Family Medicine

## 2024-01-05 ENCOUNTER — Ambulatory Visit: Payer: Self-pay | Admitting: Family Medicine

## 2024-01-05 DIAGNOSIS — I5189 Other ill-defined heart diseases: Secondary | ICD-10-CM | POA: Insufficient documentation

## 2024-01-05 NOTE — Telephone Encounter (Signed)
 I'm not sure what she's talking about. I don't see another test in the computer?

## 2024-02-07 ENCOUNTER — Other Ambulatory Visit: Payer: Self-pay | Admitting: Gastroenterology

## 2024-02-07 DIAGNOSIS — K743 Primary biliary cirrhosis: Secondary | ICD-10-CM

## 2024-02-15 ENCOUNTER — Ambulatory Visit
Admission: RE | Admit: 2024-02-15 | Discharge: 2024-02-15 | Disposition: A | Discharge status: Home / Self Care | Source: Ambulatory Visit | Attending: Gastroenterology | Admitting: Gastroenterology

## 2024-02-15 DIAGNOSIS — K743 Primary biliary cirrhosis: Secondary | ICD-10-CM | POA: Insufficient documentation

## 2024-02-16 ENCOUNTER — Other Ambulatory Visit

## 2024-02-29 ENCOUNTER — Ambulatory Visit: Admitting: Nurse Practitioner

## 2024-03-06 ENCOUNTER — Encounter: Payer: Self-pay | Admitting: Nurse Practitioner

## 2024-03-06 ENCOUNTER — Ambulatory Visit (INDEPENDENT_AMBULATORY_CARE_PROVIDER_SITE_OTHER): Admitting: Nurse Practitioner

## 2024-03-06 VITALS — BP 143/74 | HR 76 | Temp 99.4°F | Ht 67.0 in | Wt 217.4 lb

## 2024-03-06 DIAGNOSIS — K745 Biliary cirrhosis, unspecified: Secondary | ICD-10-CM

## 2024-03-06 DIAGNOSIS — I10 Essential (primary) hypertension: Secondary | ICD-10-CM

## 2024-03-06 DIAGNOSIS — R7303 Prediabetes: Secondary | ICD-10-CM

## 2024-03-06 DIAGNOSIS — E7800 Pure hypercholesterolemia, unspecified: Secondary | ICD-10-CM

## 2024-03-06 DIAGNOSIS — K743 Primary biliary cirrhosis: Secondary | ICD-10-CM | POA: Diagnosis not present

## 2024-03-06 NOTE — Assessment & Plan Note (Signed)
 Labs ordered at visit today.  Will make recommendations based on lab results.

## 2024-03-06 NOTE — Assessment & Plan Note (Signed)
 Chronic.  Followed by GI.  Recent US  and lab work were unremarkable.   Continue to follow up with Specialist.

## 2024-03-06 NOTE — Progress Notes (Signed)
 BP (!) 143/74   Pulse 76   Temp 99.4 F (37.4 C) (Oral)   Ht 5' 7 (1.702 m)   Wt 217 lb 6.4 oz (98.6 kg)   SpO2 98%   BMI 34.05 kg/m    Subjective:    Patient ID: Michaela Hardy, female    DOB: 1955-07-20, 68 y.o.   MRN: [id number]   Chief Complaint  Patient presents with   Depression   Hyperlipidemia   Hypertension   NOTE WRITTEN BY DNP STUDENT.  ASSESSMENT AND PLAN OF CARE REVIEWED WITH STUDENT, AGREE WITH ABOVE FINDINGS AND PLAN.  HPI: Michaela Hardy is a 68 y.o. female pt here for follow-up of HTN, HLD, DM2. Pt has been doing well on medications, denies side effects. Denies headaches, lightheadedness, or dizziness. Denies SOB, dizziness. Denies polyuria, polydypsia. Dizziness has resolved with epley maneuver, hasn't had another episode of dizziness in weeks. Pt BP elevated in office today, pt endorses stressors at home caring for her mother. Pt denies other concerns today.  HYPERTENSION / HYPERLIPIDEMIA Patient states she is doing well.  She has been more stressed lately which is likely why her blood pressure is up. Satisfied with current treatment? yes Duration of hypertension: years BP monitoring frequency: not checking BP range:  BP medication side effects: no Past BP meds: losartan  and HCTZ Duration of hyperlipidemia: years Cholesterol medication side effects: no Cholesterol supplements: none Past cholesterol medications: rosuvastatin  (crestor ) Medication compliance: excellent compliance Aspirin: no Recent stressors: no Recurrent headaches: no Visual changes: no Palpitations: no Dyspnea: no Chest pain: no Lower extremity edema: no Dizzy/lightheaded: no   Relevant past medical, surgical, family and social history reviewed and updated as indicated. Interim medical history since our last visit reviewed. Allergies and medications reviewed and updated.  Review of Systems  Eyes:  Negative for visual disturbance.  Respiratory:  Negative for cough, chest  tightness and shortness of breath.   Cardiovascular:  Negative for chest pain, palpitations and leg swelling.  Neurological:  Negative for dizziness and headaches.  Psychiatric/Behavioral:  Negative for dysphoric mood and suicidal ideas. The patient is not nervous/anxious.     Per HPI unless specifically indicated above     Objective:    BP (!) 143/74   Pulse 76   Temp 99.4 F (37.4 C) (Oral)   Ht 5' 7 (1.702 m)   Wt 217 lb 6.4 oz (98.6 kg)   SpO2 98%   BMI 34.05 kg/m   Wt Readings from Last 3 Encounters:  03/06/24 217 lb 6.4 oz (98.6 kg)  10/25/23 217 lb 12.8 oz (98.8 kg)  09/30/23 216 lb 12.8 oz (98.3 kg)    Physical Exam Vitals and nursing note reviewed.  Constitutional:      General: She is not in acute distress.    Appearance: Normal appearance. She is obese. She is not ill-appearing, toxic-appearing or diaphoretic.  HENT:     Head: Normocephalic.     Right Ear: External ear normal.     Left Ear: External ear normal.     Nose: Nose normal.     Mouth/Throat:     Mouth: Mucous membranes are moist.     Pharynx: Oropharynx is clear.  Eyes:     General:        Right eye: No discharge.        Left eye: No discharge.     Extraocular Movements: Extraocular movements intact.     Conjunctiva/sclera: Conjunctivae normal.     Pupils: Pupils  are equal, round, and reactive to light.  Cardiovascular:     Rate and Rhythm: Normal rate and regular rhythm.     Heart sounds: No murmur heard. Pulmonary:     Effort: Pulmonary effort is normal. No respiratory distress.     Breath sounds: Normal breath sounds. No wheezing or rales.  Musculoskeletal:     Cervical back: Normal range of motion and neck supple.  Skin:    General: Skin is warm and dry.     Capillary Refill: Capillary refill takes less than 2 seconds.  Neurological:     General: No focal deficit present.     Mental Status: She is alert and oriented to person, place, and time. Mental status is at baseline.   Psychiatric:        Mood and Affect: Mood normal.        Behavior: Behavior normal.        Thought Content: Thought content normal.        Judgment: Judgment normal.     Results for orders placed or performed during the hospital encounter of 12/28/23  ECHOCARDIOGRAM COMPLETE   Collection Time: 12/28/23  9:21 AM  Result Value Ref Range   Ao pk vel 1.27 m/s   AV Area VTI 2.80 cm2   AR max vel 2.66 cm2   AV Mean grad 3.0 mmHg   AV Peak grad 6.5 mmHg   Single Plane A2C EF 53.2 %   Single Plane A4C EF 57.1 %   Calc EF 55.4 %   S' Lateral 2.40 cm   AV Area mean vel 2.71 cm2   Area-P 1/2 3.08 cm2   MV VTI 2.84 cm2   Est EF 60 - 65%       Assessment & Plan:   Problem List Items Addressed This Visit       Cardiovascular and Mediastinum   Hypertension   Chronic.  Controlled.  Continue with current medication regimen of Telmisartan , hydrochlorothiazide , and hydralazine .  Checking blood pressures at home a couple of days a week.  If blood pressure remains elevated, recommend returning to clinic to have medications adjusted.  Labs ordered today.  Return to clinic in 6 months for reevaluation.  Call sooner if concerns arise.       Relevant Orders   Comp Met (CMET)     Digestive   Primary biliary cholangitis (HCC)   Chronic.  Followed by GI.  Recent US  and lab work were unremarkable.   Continue to follow up with Specialist.      Relevant Orders   Comp Met (CMET)   CBC w/Diff   Biliary cirrhosis (HCC) - Primary   Chronic.  Followed by GI.  Recent US  and lab work were unremarkable.        Relevant Orders   Comp Met (CMET)   CBC w/Diff     Other   Hypercholesteremia   Chronic.  Controlled.  Continue with current medication regimen on Crestor  10mg .  Labs ordered today.  Return to clinic in 6 months for reevaluation.  Call sooner if concerns arise.        Relevant Orders   Comp Met (CMET)   Lipid Profile   Prediabetes   Labs ordered at visit today.  Will make  recommendations based on lab results.        Relevant Orders   Comp Met (CMET)   HgB A1c       Follow up plan: Return in about 6 months (around  09/04/2024) for HTN, HLD, DM2 FU.

## 2024-03-06 NOTE — Assessment & Plan Note (Signed)
 Chronic.  Followed by GI.  Recent US and lab work were unremarkable.

## 2024-03-06 NOTE — Assessment & Plan Note (Signed)
Chronic.  Controlled.  Continue with current medication regimen on Crestor 10mg .  Labs ordered today.  Return to clinic in 6 months for reevaluation.  Call sooner if concerns arise.

## 2024-03-06 NOTE — Assessment & Plan Note (Addendum)
 Chronic.  Controlled.  Continue with current medication regimen of Telmisartan , hydrochlorothiazide , and hydralazine .  Checking blood pressures at home a couple of days a week.  If blood pressure remains elevated, recommend returning to clinic to have medications adjusted.  Labs ordered today.  Return to clinic in 6 months for reevaluation.  Call sooner if concerns arise.

## 2024-03-07 ENCOUNTER — Ambulatory Visit: Payer: Self-pay | Admitting: Nurse Practitioner

## 2024-03-07 LAB — COMPREHENSIVE METABOLIC PANEL WITH GFR
ALT: 48 IU/L — ABNORMAL HIGH (ref 0–32)
AST: 33 IU/L (ref 0–40)
Albumin: 4.6 g/dL (ref 3.9–4.9)
Alkaline Phosphatase: 180 IU/L — ABNORMAL HIGH (ref 49–135)
BUN/Creatinine Ratio: 18 (ref 12–28)
BUN: 12 mg/dL (ref 8–27)
Bilirubin Total: 0.3 mg/dL (ref 0.0–1.2)
CO2: 26 mmol/L (ref 20–29)
Calcium: 9.5 mg/dL (ref 8.7–10.3)
Chloride: 95 mmol/L — ABNORMAL LOW (ref 96–106)
Creatinine, Ser: 0.65 mg/dL (ref 0.57–1.00)
Globulin, Total: 2.7 g/dL (ref 1.5–4.5)
Glucose: 106 mg/dL — ABNORMAL HIGH (ref 70–99)
Potassium: 4.1 mmol/L (ref 3.5–5.2)
Sodium: 136 mmol/L (ref 134–144)
Total Protein: 7.3 g/dL (ref 6.0–8.5)
eGFR: 96 mL/min/1.73 (ref >59)

## 2024-03-07 LAB — CBC WITH DIFFERENTIAL/PLATELET
Basophils Absolute: 0.1 x10E3/uL (ref 0.0–0.2)
Basos: 1 %
EOS (ABSOLUTE): 0.1 x10E3/uL (ref 0.0–0.4)
Eos: 1 %
Hematocrit: 38.4 % (ref 34.0–46.6)
Hemoglobin: 12.2 g/dL (ref 11.1–15.9)
Immature Grans (Abs): 0 x10E3/uL (ref 0.0–0.1)
Immature Granulocytes: 0 %
Lymphocytes Absolute: 1.6 x10E3/uL (ref 0.7–3.1)
Lymphs: 20 %
MCH: 29.8 pg (ref 26.6–33.0)
MCHC: 31.8 g/dL (ref 31.5–35.7)
MCV: 94 fL (ref 79–97)
Monocytes Absolute: 0.8 x10E3/uL (ref 0.1–0.9)
Monocytes: 10 %
Neutrophils Absolute: 5.7 x10E3/uL (ref 1.4–7.0)
Neutrophils: 68 %
Platelets: 436 x10E3/uL (ref 150–450)
RBC: 4.1 x10E6/uL (ref 3.77–5.28)
RDW: 12.8 % (ref 11.7–15.4)
WBC: 8.4 x10E3/uL (ref 3.4–10.8)

## 2024-03-07 LAB — LIPID PANEL
Chol/HDL Ratio: 2.9 ratio (ref 0.0–4.4)
Cholesterol, Total: 182 mg/dL (ref 100–199)
HDL: 63 mg/dL (ref >39)
LDL Chol Calc (NIH): 95 mg/dL (ref 0–99)
Triglycerides: 140 mg/dL (ref 0–149)
VLDL Cholesterol Cal: 24 mg/dL (ref 5–40)

## 2024-03-07 LAB — HEMOGLOBIN A1C
Est. average glucose Bld gHb Est-mCnc: 128 mg/dL
Hgb A1c MFr Bld: 6.1 % — ABNORMAL HIGH (ref 4.8–5.6)

## 2024-04-01 ENCOUNTER — Other Ambulatory Visit: Payer: Self-pay | Admitting: Nurse Practitioner

## 2024-04-03 NOTE — Telephone Encounter (Signed)
 Requested Prescriptions  Pending Prescriptions Disp Refills   hydrochlorothiazide  (HYDRODIURIL ) 25 MG tablet [Pharmacy Med Name: hydroCHLOROthiazide  25 MG Oral Tablet] 90 tablet 1    Sig: TAKE 1 TABLET BY MOUTH DAILY     Cardiovascular: Diuretics - Thiazide Failed - 04/03/2024  8:18 AM      Failed - Last BP in normal range    BP Readings from Last 1 Encounters:  03/06/24 (!) 143/74         Passed - Cr in normal range and within 180 days    Creatinine, Ser  Date Value Ref Range Status  03/06/2024 0.65 0.57 - 1.00 mg/dL Final         Passed - K in normal range and within 180 days    Potassium  Date Value Ref Range Status  03/06/2024 4.1 3.5 - 5.2 mmol/L Final         Passed - Na in normal range and within 180 days    Sodium  Date Value Ref Range Status  03/06/2024 136 134 - 144 mmol/L Final         Passed - Valid encounter within last 6 months    Recent Outpatient Visits           4 weeks ago Biliary cirrhosis (HCC)   [place] [facility] [name], NP   5 months ago Dizziness   [place] [facility] [name], NP   6 months ago Dizziness   [place] [facility] [place], [place] P, DO   7 months ago Primary biliary cholangitis [facility])   [place] [facility] [name], NP               telmisartan  (MICARDIS ) 80 MG tablet [Pharmacy Med Name: Telmisartan  80 MG Oral Tablet] 90 tablet 1    Sig: TAKE 1 TABLET BY MOUTH DAILY     Cardiovascular:  Angiotensin Receptor Blockers Failed - 04/03/2024  8:18 AM      Failed - Last BP in normal range    BP Readings from Last 1 Encounters:  03/06/24 (!) 143/74         Passed - Cr in normal range and within 180 days    Creatinine, Ser  Date Value Ref Range Status  03/06/2024 0.65 0.57 - 1.00 mg/dL Final         Passed - K in normal range and within 180 days    Potassium  Date Value Ref Range Status  03/06/2024 4.1 3.5 - 5.2 mmol/L Final          Passed - Patient is not pregnant      Passed - Valid encounter within last 6 months    Recent Outpatient Visits           4 weeks ago Biliary cirrhosis [facility])   [place] [facility] [name], NP   5 months ago Dizziness   [place] [facility] [name], NP   6 months ago Dizziness   [place] [facility], Megan P, DO   7 months ago Primary biliary cholangitis [facility])   [place] [facility] [name], NP

## 2024-04-18 ENCOUNTER — Other Ambulatory Visit: Payer: Self-pay | Admitting: Obstetrics and Gynecology

## 2024-04-18 DIAGNOSIS — Z1231 Encounter for screening mammogram for malignant neoplasm of breast: Secondary | ICD-10-CM

## 2024-04-30 ENCOUNTER — Other Ambulatory Visit: Payer: Self-pay

## 2024-04-30 ENCOUNTER — Emergency Department

## 2024-04-30 ENCOUNTER — Encounter: Payer: Self-pay | Admitting: Emergency Medicine

## 2024-04-30 ENCOUNTER — Ambulatory Visit: Payer: Self-pay

## 2024-04-30 ENCOUNTER — Inpatient Hospital Stay
Admission: EM | Admit: 2024-04-30 | Discharge: 2024-05-02 | Disposition: A | Discharge status: Home / Self Care | Attending: Internal Medicine | Admitting: Internal Medicine

## 2024-04-30 DIAGNOSIS — Z79899 Other long term (current) drug therapy: Secondary | ICD-10-CM

## 2024-04-30 DIAGNOSIS — J40 Bronchitis, not specified as acute or chronic: Principal | ICD-10-CM

## 2024-04-30 DIAGNOSIS — Z83511 Family history of glaucoma: Secondary | ICD-10-CM

## 2024-04-30 DIAGNOSIS — J101 Influenza due to other identified influenza virus with other respiratory manifestations: Secondary | ICD-10-CM | POA: Diagnosis present

## 2024-04-30 DIAGNOSIS — I5032 Chronic diastolic (congestive) heart failure: Secondary | ICD-10-CM | POA: Diagnosis present

## 2024-04-30 DIAGNOSIS — Z6838 Body mass index (BMI) 38.0-38.9, adult: Secondary | ICD-10-CM

## 2024-04-30 DIAGNOSIS — Z820 Family history of epilepsy and other diseases of the nervous system: Secondary | ICD-10-CM

## 2024-04-30 DIAGNOSIS — K219 Gastro-esophageal reflux disease without esophagitis: Secondary | ICD-10-CM | POA: Diagnosis present

## 2024-04-30 DIAGNOSIS — Z87891 Personal history of nicotine dependence: Secondary | ICD-10-CM

## 2024-04-30 DIAGNOSIS — I110 Hypertensive heart disease with heart failure: Secondary | ICD-10-CM | POA: Diagnosis present

## 2024-04-30 DIAGNOSIS — Z96642 Presence of left artificial hip joint: Secondary | ICD-10-CM | POA: Diagnosis present

## 2024-04-30 DIAGNOSIS — Z823 Family history of stroke: Secondary | ICD-10-CM

## 2024-04-30 DIAGNOSIS — I10 Essential (primary) hypertension: Secondary | ICD-10-CM | POA: Diagnosis present

## 2024-04-30 DIAGNOSIS — Z9049 Acquired absence of other specified parts of digestive tract: Secondary | ICD-10-CM

## 2024-04-30 DIAGNOSIS — E871 Hypo-osmolality and hyponatremia: Principal | ICD-10-CM | POA: Diagnosis present

## 2024-04-30 DIAGNOSIS — E669 Obesity, unspecified: Secondary | ICD-10-CM | POA: Diagnosis present

## 2024-04-30 DIAGNOSIS — R946 Abnormal results of thyroid function studies: Secondary | ICD-10-CM | POA: Diagnosis present

## 2024-04-30 DIAGNOSIS — Z8349 Family history of other endocrine, nutritional and metabolic diseases: Secondary | ICD-10-CM

## 2024-04-30 DIAGNOSIS — Z8249 Family history of ischemic heart disease and other diseases of the circulatory system: Secondary | ICD-10-CM

## 2024-04-30 DIAGNOSIS — E876 Hypokalemia: Secondary | ICD-10-CM | POA: Diagnosis present

## 2024-04-30 DIAGNOSIS — E785 Hyperlipidemia, unspecified: Secondary | ICD-10-CM | POA: Diagnosis present

## 2024-04-30 LAB — CBC
HCT: 34.5 % — ABNORMAL LOW (ref 36.0–46.0)
Hemoglobin: 12.3 g/dL (ref 12.0–15.0)
MCH: 29.9 pg (ref 26.0–34.0)
MCHC: 35.7 g/dL (ref 30.0–36.0)
MCV: 83.7 fL (ref 80.0–100.0)
Platelets: 349 K/uL (ref 150–400)
RBC: 4.12 MIL/uL (ref 3.87–5.11)
RDW: 12.3 % (ref 11.5–15.5)
WBC: 7.4 K/uL (ref 4.0–10.5)
nRBC: 0 % (ref 0.0–0.2)

## 2024-04-30 LAB — BASIC METABOLIC PANEL WITH GFR
Anion gap: 14 (ref 5–15)
BUN: 24 mg/dL — ABNORMAL HIGH (ref 8–23)
CO2: 26 mmol/L (ref 22–32)
Calcium: 9 mg/dL (ref 8.9–10.3)
Chloride: 80 mmol/L — ABNORMAL LOW (ref 98–111)
Creatinine, Ser: 0.84 mg/dL (ref 0.44–1.00)
GFR, Estimated: >60 mL/min
Glucose, Bld: 125 mg/dL — ABNORMAL HIGH (ref 70–99)
Potassium: 2.8 mmol/L — ABNORMAL LOW (ref 3.5–5.1)
Sodium: 121 mmol/L — ABNORMAL LOW (ref 135–145)

## 2024-04-30 LAB — TROPONIN T, HIGH SENSITIVITY
Troponin T High Sensitivity: <15 ng/L (ref 0–19)
Troponin T High Sensitivity: <15 ng/L (ref 0–19)

## 2024-04-30 MED ORDER — SODIUM CHLORIDE 0.9 % IV SOLN: INTRAVENOUS | Status: DC

## 2024-04-30 MED ORDER — SODIUM CHLORIDE 0.9 % IV BOLUS (SEPSIS)
1000.0000 mL | Freq: Once | INTRAVENOUS | Status: AC
  Administered 2024-05-01: 1000 mL via INTRAVENOUS

## 2024-04-30 MED ORDER — IPRATROPIUM-ALBUTEROL 0.5-2.5 (3) MG/3ML IN SOLN
3.0000 mL | Freq: Once | RESPIRATORY_TRACT | Status: AC
  Administered 2024-05-01: 3 mL via RESPIRATORY_TRACT
  Filled 2024-04-30: qty 3

## 2024-04-30 MED ORDER — SODIUM CHLORIDE 0.9 % IV SOLN
500.0000 mg | Freq: Once | INTRAVENOUS | Status: AC
  Administered 2024-05-01: 500 mg via INTRAVENOUS
  Filled 2024-04-30: qty 5

## 2024-04-30 MED ORDER — METHYLPREDNISOLONE SODIUM SUCC 125 MG IJ SOLR
125.0000 mg | Freq: Once | INTRAMUSCULAR | Status: AC
  Administered 2024-05-01: 125 mg via INTRAVENOUS
  Filled 2024-04-30: qty 2

## 2024-04-30 MED ORDER — HYDROCOD POLI-CHLORPHE POLI ER 10-8 MG/5ML PO SUER
5.0000 mL | Freq: Once | ORAL | Status: AC
  Administered 2024-05-01: 5 mL via ORAL
  Filled 2024-04-30: qty 5

## 2024-04-30 NOTE — Telephone Encounter (Signed)
 I can see patient tomorrow at 10.

## 2024-04-30 NOTE — ED Provider Notes (Signed)
 "  [facility] Provider Note    Event Date/Time   First MD Initiated Contact with Patient 04/30/24 2317     (approximate)   History   Shortness of Breath, Cough, and Chest Pain   HPI  Michaela Hardy is a 68 y.o. female with history of anxiety, depression, hypertension, hyperlipidemia, primary biliary cholangitis who presents to the emergency department complaints of chest pain, shortness of breath, cough, congestion, fevers, body aches for several days.  No vomiting or diarrhea.  Patient was a previous smoker.  Denies history of asthma or COPD.   History provided by patient, husband.    Past Medical History:  Diagnosis Date   Anxiety    Depression    GERD (gastroesophageal reflux disease)    Hepatitis    Primary bilary cholangitis   Hyperlipidemia    Hypertension    PONV (postoperative nausea and vomiting)     Past Surgical History:  Procedure Laterality Date   APPENDECTOMY  1976   BREAST BIOPSY Left 10/03/2014   Dr. Avanell   BREAST EXCISIONAL BIOPSY Left    BREAST LUMPECTOMY WITH RADIOACTIVE SEED LOCALIZATION Left 11/21/2014   Procedure: LEFT BREAST LUMPECTOMY WITH RADIOACTIVE SEED LOCALIZATION;  Surgeon: Deward Null III, MD;  Location: [facility];  Service: General;  Laterality: Left;   CERVICAL BIOPSY  W/ LOOP ELECTRODE EXCISION     CHOLECYSTECTOMY  06/29/2007   LIVER BIOPSY  2008   TOTAL HIP ARTHROPLASTY Left 09/2022   TUBAL LIGATION  1987    MEDICATIONS:  Prior to Admission medications  Medication Sig Start Date End Date Taking? Authorizing Provider  calcium  carbonate (OS-CAL) 600 MG TABS tablet Take 1 tablet by mouth daily with breakfast.     [provider]  cyanocobalamin  (VITAMIN B12) 1000 MCG tablet Take 1,000 mcg by mouth daily.    [provider]  hydrALAZINE  (APRESOLINE ) 10 MG tablet TAKE 1 TABLET BY MOUTH 3 TIMES  DAILY 12/14/23   [name], NP  hydrochlorothiazide  (HYDRODIURIL ) 25 MG  tablet TAKE 1 TABLET BY MOUTH DAILY 04/03/24   [name], NP  Multiple Vitamin tablet Take 1 tablet by mouth daily.    [provider]  omeprazole (PRILOSEC) 20 MG capsule Take 1 capsule by mouth daily.    [provider]  rosuvastatin  (CRESTOR ) 10 MG tablet TAKE 1 TABLET BY MOUTH DAILY 12/14/23   [name], NP  telmisartan  (MICARDIS ) 80 MG tablet TAKE 1 TABLET BY MOUTH DAILY 04/03/24   [name], NP  ursodiol  (ACTIGALL ) 300 MG capsule Take 2 capsules by mouth 2 (two) times daily.    [provider]    Physical Exam   Triage Vital Signs: ED Triage Vitals  Encounter Vitals Group     BP 04/30/24 2048 (!) 173/68     Girls Systolic BP Percentile --      Girls Diastolic BP Percentile --      Boys Systolic BP Percentile --      Boys Diastolic BP Percentile --      Pulse Rate 04/30/24 2048 79     Resp 04/30/24 2048 18     Temp 04/30/24 2048 98.2 F (36.8 C)     Temp Source 04/30/24 2048 Oral     SpO2 04/30/24 2048 96 %     Weight --      Height --      Head Circumference --      Peak Flow --  Pain Score 04/30/24 2045 4     Pain Loc --      Pain Education --      Exclude from Growth Chart --     Most recent vital signs: Vitals:   04/30/24 2256 04/30/24 2340  BP: (!) 187/76 124/69  Pulse: 73 78  Resp: 18 13  Temp: 98.3 F (36.8 C) 98.4 F (36.9 C)  SpO2: 99% 97%    CONSTITUTIONAL: Alert, responds appropriately to questions. Well-appearing; well-nourished HEAD: Normocephalic, atraumatic EYES: Conjunctivae clear, pupils appear equal, sclera nonicteric ENT: normal nose; moist mucous membranes, slightly hoarse voice NECK: Supple, normal ROM, no meningismus CARD: RRR; S1 and S2 appreciated RESP: Normal chest excursion without splinting or tachypnea; scattered expiratory wheezes, no rhonchi, no rales, no hypoxia or respiratory distress, speaking full sentences ABD/GI: Non-distended; soft, non-tender, no rebound, no guarding,  no peritoneal signs BACK: The back appears normal EXT: Normal ROM in all joints; no deformity noted, no edema SKIN: Normal color for age and race; warm; no rash on exposed skin NEURO: Moves all extremities equally, normal speech PSYCH: The patient's mood and manner are appropriate.   ED Results / Procedures / Treatments   LABS: (all labs ordered are listed, but only abnormal results are displayed) Labs Reviewed  RESP PANEL BY RT-PCR (RSV, FLU A&B, COVID)  RVPGX2 - Abnormal; Notable for the following components:      Result Value   Influenza A by PCR POSITIVE (*)    All other components within normal limits  BASIC METABOLIC PANEL WITH GFR - Abnormal; Notable for the following components:   Sodium 121 (*)    Potassium 2.8 (*)    Chloride 80 (*)    Glucose, Bld 125 (*)    BUN 24 (*)    All other components within normal limits  CBC - Abnormal; Notable for the following components:   HCT 34.5 (*)    All other components within normal limits  OSMOLALITY - Abnormal; Notable for the following components:   Osmolality 252 (*)    All other components within normal limits  TSH - Abnormal; Notable for the following components:   TSH 5.880 (*)    All other components within normal limits  MAGNESIUM  OSMOLALITY, URINE  SODIUM, URINE, RANDOM  BASIC METABOLIC PANEL WITH GFR  BASIC METABOLIC PANEL WITH GFR  BASIC METABOLIC PANEL WITH GFR  HIV ANTIBODY (ROUTINE TESTING W REFLEX)  TROPONIN T, HIGH SENSITIVITY  TROPONIN T, HIGH SENSITIVITY     EKG:  EKG Interpretation Date/Time:  Monday April 30 2024 20:50:09 EST Ventricular Rate:  78 PR Interval:  152 QRS Duration:  88 QT Interval:  404 QTC Calculation: 460 R Axis:   68  Text Interpretation: Normal sinus rhythm Nonspecific ST abnormality Abnormal ECG When compared with ECG of 18-Nov-2014 14:51, No significant change was found Confirmed by Neomi Neptune [phone]) on 04/30/2024 11:36:34 PM         RADIOLOGY: My personal  review and interpretation of imaging: Chest x-ray shows bronchitic changes.  I have personally reviewed all radiology reports.   DG Chest 2 View Result Date: 04/30/2024 CLINICAL DATA:  Chest pressure.  Shortness of breath.  Cough. EXAM: DG CHEST 2V COMPARISON:  None Available. FINDINGS: The cardiomediastinal contours are normal. Aortic atherosclerosis. Mild hyperinflation and diffuse bronchial thickening. Pulmonary vasculature is normal. No consolidation, pleural effusion, or pneumothorax. No acute osseous abnormalities are seen. IMPRESSION: Mild hyperinflation and bronchial thickening, can be seen with smoking sequela, bronchitis or asthma.  Electronically Signed   By: Andrea Gasman M.D.   On: 04/30/2024 21:23     PROCEDURES:  Critical Care performed: Yes, see critical care procedure note(s)   CRITICAL CARE Performed by: Josette Tekisha Darcey   Total critical care time: 30 minutes  Critical care time was exclusive of separately billable procedures and treating other patients.  Critical care was necessary to treat or prevent imminent or life-threatening deterioration.  Critical care was time spent personally by me on the following activities: development of treatment plan with patient and/or surrogate as well as nursing, discussions with consultants, evaluation of patient's response to treatment, examination of patient, obtaining history from patient or surrogate, ordering and performing treatments and interventions, ordering and review of laboratory studies, ordering and review of radiographic studies, pulse oximetry and re-evaluation of patient's condition.   SABRA1-3 Lead EKG Interpretation  Performed by: Adiah Guereca, Josette SAILOR, DO Authorized by: Cortne Amara, Josette SAILOR, DO     Interpretation: normal     ECG rate:  78   ECG rate assessment: normal     Rhythm: sinus rhythm     Ectopy: none     Conduction: normal       IMPRESSION / MDM / ASSESSMENT AND PLAN / ED COURSE  I reviewed the triage vital  signs and the nursing notes.    Patient here with complaints of cough, fevers and congestion.  She does have some wheezing on exam.  The patient is on the cardiac monitor to evaluate for evidence of arrhythmia and/or significant heart rate changes.   DIFFERENTIAL DIAGNOSIS (includes but not limited to):   Viral URI, pneumonia, asthma exacerbation, bronchitis, COPD, less likely ACS, CHF, PE   Patient's presentation is most consistent with acute presentation with potential threat to life or bodily function.   PLAN: Patient's troponin x 2 negative.  No leukocytosis or leukopenia.  Chest x-ray reviewed and interpreted by myself and the radiologist and shows bronchitic changes versus asthma.  She was a previous smoker.  Does have wheezing on exam.  Will give DuoNeb, Solu-Medrol  and start azithromycin .  COVID and flu swab pending.    She is hyponatremic with a sodium of 121.  This was drawn as a peripheral blood stick and not off of an IV.  She is on HCTZ which may be the cause of this.  She has never been hyponatremic before and her last sodium in October was normal.  We discussed risk with hyponatremia and recommended admission and she is agreeable.  Will start IV fluids.  She is also hypokalemic with a potassium of 2.8.  Will give oral and IV replacement.  Magnesium level pending.  EKG shows no interval changes.   MEDICATIONS GIVEN IN ED: Medications  0.9 %  sodium chloride  infusion ( Intravenous New Bag/Given 05/01/24 0004)  potassium chloride  10 mEq in 100 mL IVPB (0 mEq Intravenous Stopped 05/01/24 0134)  enoxaparin  (LOVENOX ) injection 40 mg (has no administration in time range)  0.9 %  sodium chloride  infusion ( Intravenous New Bag/Given 05/01/24 0145)  acetaminophen  (TYLENOL ) tablet 500 mg (has no administration in time range)  prochlorperazine  (COMPAZINE ) injection 5 mg (has no administration in time range)  melatonin tablet 5 mg (has no administration in time range)  polyethylene  glycol (MIRALAX  / GLYCOLAX ) packet 17 g (has no administration in time range)  oseltamivir  (TAMIFLU ) capsule 75 mg (has no administration in time range)  sodium chloride  0.9 % bolus 1,000 mL (0 mLs Intravenous Stopped 05/01/24 0143)  chlorpheniramine-HYDROcodone (TUSSIONEX)  10-8 MG/5ML suspension 5 mL (5 mLs Oral Given 05/01/24 0006)  ipratropium-albuterol  (DUONEB) 0.5-2.5 (3) MG/3ML nebulizer solution 3 mL (3 mLs Nebulization Given 05/01/24 0008)  methylPREDNISolone  sodium succinate (SOLU-MEDROL ) 125 mg/2 mL injection 125 mg (125 mg Intravenous Given 05/01/24 0006)  azithromycin  (ZITHROMAX ) 500 mg in sodium chloride  0.9 % 250 mL IVPB (0 mg Intravenous Stopped 05/01/24 0143)  potassium chloride  (KLOR-CON ) packet 40 mEq (40 mEq Oral Given 05/01/24 0041)     ED COURSE: Patient is positive for influenza.  Updated the hospitalist who has ordered Tamiflu .  Will place her on droplet precautions.  Magnesium level normal.   CONSULTS:  Consulted and discussed patient's case with hospitalist, Dr. Shona.  I have recommended admission and consulting physician agrees and will place admission orders.  Patient (and family if present) agree with this plan.   I reviewed all nursing notes, vitals, pertinent previous records.  All labs, EKGs, imaging ordered have been independently reviewed and interpreted by myself.    OUTSIDE RECORDS REVIEWED: Reviewed recent PCP notes.       FINAL CLINICAL IMPRESSION(S) / ED DIAGNOSES   Final diagnoses:  Bronchitis  Hyponatremia  Hypokalemia  Influenza A     Rx / DC Orders   ED Discharge Orders     None        Note:  This document was prepared using Dragon voice recognition software and may include unintentional dictation errors.   Shykeria Sakamoto, Josette SAILOR, DO 05/01/24 [phone]  "

## 2024-04-30 NOTE — ED Notes (Signed)
 ED Provider at bedside.

## 2024-04-30 NOTE — Telephone Encounter (Signed)
 FYI Only or Action Required?: FYI only for provider: Home care advised, also advised to call back if symptoms worsen.  Patient was last seen in primary care on 03/06/2024 by [name], NP.  Called Nurse Triage reporting Cough and Fever.  Symptoms began several days ago.  Interventions attempted: OTC medications: Mucinex .  Symptoms are: gradually improving. Patient states she feels better than last week during time of triage.   Triage Disposition: Home Care  Patient/caregiver understands and will follow disposition?: Yes  Reason for Disposition  Cough  Answer Assessment - Initial Assessment Questions 1. ONSET: When did the cough begin?      5 days ago  2. SEVERITY: How bad is the cough today?      Mild-Moderate  3. SPUTUM: Describe the color of your sputum (e.g., none, dry cough; clear, white, yellow, green)     Dark green  4. HEMOPTYSIS: Are you coughing up any blood? If Yes, ask: How much? (e.g., flecks, streaks, tablespoons, etc.)     No  5. DIFFICULTY BREATHING: Are you having difficulty breathing? If Yes, ask: How bad is it? (e.g., mild, moderate, severe)      Denies difficulty breathing  6. FEVER: Do you have a fever? If Yes, ask: What is your temperature, how was it measured, and when did it start?     Not currently, patient states she had a fever on Thursday but has not had one since  7. CARDIAC HISTORY: Do you have any history of heart disease? (e.g., heart attack, congestive heart failure)      No  8. LUNG HISTORY: Do you have any history of lung disease?  (e.g., pulmonary embolus, asthma, emphysema)     No  9. PE RISK FACTORS: Do you have a history of blood clots? (or: recent major surgery, recent prolonged travel, bedridden)     No  10. OTHER SYMPTOMS: Do you have any other symptoms? (e.g., runny nose, wheezing, chest pain)       Chest tightness that  she reports improved after taking mucinex  and coughing up some sputum last  night, lack of appetite  11. PREGNANCY: Is there any chance you are pregnant? When was your last menstrual period?       NA  12. TRAVEL: Have you traveled out of the country in the last month? (e.g., travel history, exposures)       Unknown  Protocols used: Cough - Acute Productive-A-AH  Copied from CRM [id number]. Topic: Clinical - Red Word Triage >> Apr 30, 2024  9:46 AM Nathanel BROCKS wrote: Red Word that prompted transfer to Nurse Triage:   Tight chest, coughing. Took covid test this morning and was negative.    ----------------------------------------------------------------------- From previous Reason for Contact - Scheduling: Patient/patient representative is calling to schedule an appointment. Refer to attachments for appointment information.

## 2024-04-30 NOTE — Telephone Encounter (Signed)
 Called and scheduled the patient an appointment for tomorrow at 10. Patient preferred virtual appointment.

## 2024-05-01 ENCOUNTER — Ambulatory Visit: Admitting: Nurse Practitioner

## 2024-05-01 DIAGNOSIS — E785 Hyperlipidemia, unspecified: Secondary | ICD-10-CM | POA: Diagnosis present

## 2024-05-01 DIAGNOSIS — I5032 Chronic diastolic (congestive) heart failure: Secondary | ICD-10-CM | POA: Diagnosis present

## 2024-05-01 DIAGNOSIS — I10 Essential (primary) hypertension: Secondary | ICD-10-CM | POA: Diagnosis not present

## 2024-05-01 DIAGNOSIS — Z8349 Family history of other endocrine, nutritional and metabolic diseases: Secondary | ICD-10-CM | POA: Diagnosis not present

## 2024-05-01 DIAGNOSIS — Z83511 Family history of glaucoma: Secondary | ICD-10-CM | POA: Diagnosis not present

## 2024-05-01 DIAGNOSIS — E876 Hypokalemia: Secondary | ICD-10-CM

## 2024-05-01 DIAGNOSIS — I110 Hypertensive heart disease with heart failure: Secondary | ICD-10-CM | POA: Diagnosis present

## 2024-05-01 DIAGNOSIS — J40 Bronchitis, not specified as acute or chronic: Principal | ICD-10-CM

## 2024-05-01 DIAGNOSIS — Z6838 Body mass index (BMI) 38.0-38.9, adult: Secondary | ICD-10-CM | POA: Diagnosis not present

## 2024-05-01 DIAGNOSIS — Z79899 Other long term (current) drug therapy: Secondary | ICD-10-CM | POA: Diagnosis not present

## 2024-05-01 DIAGNOSIS — K219 Gastro-esophageal reflux disease without esophagitis: Secondary | ICD-10-CM | POA: Diagnosis present

## 2024-05-01 DIAGNOSIS — Z9049 Acquired absence of other specified parts of digestive tract: Secondary | ICD-10-CM | POA: Diagnosis not present

## 2024-05-01 DIAGNOSIS — E871 Hypo-osmolality and hyponatremia: Secondary | ICD-10-CM | POA: Diagnosis present

## 2024-05-01 DIAGNOSIS — J101 Influenza due to other identified influenza virus with other respiratory manifestations: Secondary | ICD-10-CM | POA: Diagnosis present

## 2024-05-01 DIAGNOSIS — Z8249 Family history of ischemic heart disease and other diseases of the circulatory system: Secondary | ICD-10-CM | POA: Diagnosis not present

## 2024-05-01 DIAGNOSIS — Z87891 Personal history of nicotine dependence: Secondary | ICD-10-CM | POA: Diagnosis not present

## 2024-05-01 DIAGNOSIS — R0602 Shortness of breath: Secondary | ICD-10-CM | POA: Diagnosis present

## 2024-05-01 DIAGNOSIS — Z96642 Presence of left artificial hip joint: Secondary | ICD-10-CM | POA: Diagnosis present

## 2024-05-01 DIAGNOSIS — E669 Obesity, unspecified: Secondary | ICD-10-CM | POA: Diagnosis present

## 2024-05-01 DIAGNOSIS — Z820 Family history of epilepsy and other diseases of the nervous system: Secondary | ICD-10-CM | POA: Diagnosis not present

## 2024-05-01 DIAGNOSIS — R946 Abnormal results of thyroid function studies: Secondary | ICD-10-CM | POA: Diagnosis present

## 2024-05-01 DIAGNOSIS — Z823 Family history of stroke: Secondary | ICD-10-CM | POA: Diagnosis not present

## 2024-05-01 LAB — RESP PANEL BY RT-PCR (RSV, FLU A&B, COVID)  RVPGX2
Influenza A by PCR: POSITIVE — AB
Influenza B by PCR: NEGATIVE
Resp Syncytial Virus by PCR: NEGATIVE
SARS Coronavirus 2 by RT PCR: NEGATIVE

## 2024-05-01 LAB — BASIC METABOLIC PANEL WITH GFR
Anion gap: 12 (ref 5–15)
Anion gap: 15 (ref 5–15)
Anion gap: 16 — ABNORMAL HIGH (ref 5–15)
BUN: 19 mg/dL (ref 8–23)
BUN: 18 mg/dL (ref 8–23)
BUN: 15 mg/dL (ref 8–23)
CO2: 24 mmol/L (ref 22–32)
CO2: 22 mmol/L (ref 22–32)
CO2: 22 mmol/L (ref 22–32)
Calcium: 8.3 mg/dL — ABNORMAL LOW (ref 8.9–10.3)
Calcium: 8.4 mg/dL — ABNORMAL LOW (ref 8.9–10.3)
Calcium: 8.7 mg/dL — ABNORMAL LOW (ref 8.9–10.3)
Chloride: 84 mmol/L — ABNORMAL LOW (ref 98–111)
Chloride: 84 mmol/L — ABNORMAL LOW (ref 98–111)
Chloride: 87 mmol/L — ABNORMAL LOW (ref 98–111)
Creatinine, Ser: 0.59 mg/dL (ref 0.44–1.00)
Creatinine, Ser: 0.59 mg/dL (ref 0.44–1.00)
Creatinine, Ser: 0.71 mg/dL (ref 0.44–1.00)
GFR, Estimated: >60 mL/min
GFR, Estimated: >60 mL/min
GFR, Estimated: >60 mL/min
Glucose, Bld: 141 mg/dL — ABNORMAL HIGH (ref 70–99)
Glucose, Bld: 147 mg/dL — ABNORMAL HIGH (ref 70–99)
Glucose, Bld: 276 mg/dL — ABNORMAL HIGH (ref 70–99)
Potassium: 4 mmol/L (ref 3.5–5.1)
Potassium: 3.9 mmol/L (ref 3.5–5.1)
Potassium: 3.8 mmol/L (ref 3.5–5.1)
Sodium: 120 mmol/L — ABNORMAL LOW (ref 135–145)
Sodium: 121 mmol/L — ABNORMAL LOW (ref 135–145)
Sodium: 124 mmol/L — ABNORMAL LOW (ref 135–145)

## 2024-05-01 LAB — OSMOLALITY: Osmolality: 252 mosm/kg — ABNORMAL LOW (ref 275–295)

## 2024-05-01 LAB — TSH: TSH: 5.88 u[IU]/mL — ABNORMAL HIGH (ref 0.350–4.500)

## 2024-05-01 LAB — SODIUM, URINE, RANDOM: Sodium, Ur: <30 mmol/L

## 2024-05-01 LAB — MAGNESIUM: Magnesium: 2.1 mg/dL (ref 1.7–2.4)

## 2024-05-01 MED ORDER — POTASSIUM CHLORIDE 20 MEQ PO PACK
40.0000 meq | PACK | Freq: Once | ORAL | Status: AC
  Administered 2024-05-01: 40 meq via ORAL
  Filled 2024-05-01: qty 2

## 2024-05-01 MED ORDER — IPRATROPIUM-ALBUTEROL 0.5-2.5 (3) MG/3ML IN SOLN
3.0000 mL | Freq: Four times a day (QID) | RESPIRATORY_TRACT | Status: DC
  Administered 2024-05-01 (×2): 3 mL via RESPIRATORY_TRACT
  Filled 2024-05-01 (×2): qty 3

## 2024-05-01 MED ORDER — AZITHROMYCIN 500 MG PO TABS
250.0000 mg | ORAL_TABLET | Freq: Every day | ORAL | Status: DC
  Administered 2024-05-02: 250 mg via ORAL
  Filled 2024-05-01: qty 1

## 2024-05-01 MED ORDER — POTASSIUM CHLORIDE 10 MEQ/100ML IV SOLN
10.0000 meq | INTRAVENOUS | Status: AC
  Administered 2024-05-01 (×2): 10 meq via INTRAVENOUS
  Filled 2024-05-01 (×2): qty 100

## 2024-05-01 MED ORDER — URSODIOL 300 MG PO CAPS
600.0000 mg | ORAL_CAPSULE | Freq: Two times a day (BID) | ORAL | Status: DC
  Administered 2024-05-01 – 2024-05-02 (×3): 600 mg via ORAL
  Filled 2024-05-01 (×3): qty 2

## 2024-05-01 MED ORDER — PROCHLORPERAZINE EDISYLATE 10 MG/2ML IJ SOLN: 5.0000 mg | Freq: Four times a day (QID) | INTRAMUSCULAR | Status: DC | PRN

## 2024-05-01 MED ORDER — IPRATROPIUM-ALBUTEROL 0.5-2.5 (3) MG/3ML IN SOLN
3.0000 mL | Freq: Three times a day (TID) | RESPIRATORY_TRACT | Status: DC
  Administered 2024-05-01 – 2024-05-02 (×2): 3 mL via RESPIRATORY_TRACT
  Filled 2024-05-01 (×2): qty 3

## 2024-05-01 MED ORDER — ENOXAPARIN SODIUM 60 MG/0.6ML IJ SOSY
55.0000 mg | PREFILLED_SYRINGE | INTRAMUSCULAR | Status: DC
  Administered 2024-05-01 – 2024-05-02 (×2): 55 mg via SUBCUTANEOUS
  Filled 2024-05-01 (×2): qty 0.6

## 2024-05-01 MED ORDER — IRBESARTAN 150 MG PO TABS
75.0000 mg | ORAL_TABLET | Freq: Every day | ORAL | Status: DC
  Administered 2024-05-01 – 2024-05-02 (×2): 75 mg via ORAL
  Filled 2024-05-01 (×4): qty 1

## 2024-05-01 MED ORDER — SODIUM CHLORIDE 0.9 % IV SOLN: INTRAVENOUS | Status: DC

## 2024-05-01 MED ORDER — ACETAMINOPHEN 500 MG PO TABS: 500.0000 mg | ORAL_TABLET | Freq: Four times a day (QID) | ORAL | Status: DC | PRN

## 2024-05-01 MED ORDER — ROSUVASTATIN CALCIUM 10 MG PO TABS
10.0000 mg | ORAL_TABLET | Freq: Every day | ORAL | Status: DC
  Administered 2024-05-01 – 2024-05-02 (×2): 10 mg via ORAL
  Filled 2024-05-01 (×3): qty 1

## 2024-05-01 MED ORDER — VITAMIN B-12 1000 MCG PO TABS
1000.0000 ug | ORAL_TABLET | Freq: Every day | ORAL | Status: DC
  Administered 2024-05-01 – 2024-05-02 (×2): 1000 ug via ORAL
  Filled 2024-05-01: qty 1
  Filled 2024-05-01: qty 2

## 2024-05-01 MED ORDER — GUAIFENESIN-DM 100-10 MG/5ML PO SYRP: 5.0000 mL | ORAL_SOLUTION | ORAL | Status: DC | PRN

## 2024-05-01 MED ORDER — ALBUTEROL SULFATE (2.5 MG/3ML) 0.083% IN NEBU: 2.5000 mg | INHALATION_SOLUTION | RESPIRATORY_TRACT | Status: DC | PRN

## 2024-05-01 MED ORDER — OSELTAMIVIR PHOSPHATE 75 MG PO CAPS
75.0000 mg | ORAL_CAPSULE | Freq: Two times a day (BID) | ORAL | Status: DC
  Administered 2024-05-01 – 2024-05-02 (×4): 75 mg via ORAL
  Filled 2024-05-01 (×4): qty 1

## 2024-05-01 MED ORDER — PREDNISONE 20 MG PO TABS
40.0000 mg | ORAL_TABLET | Freq: Every day | ORAL | Status: DC
  Administered 2024-05-02: 40 mg via ORAL
  Filled 2024-05-01: qty 2

## 2024-05-01 MED ORDER — HYDRALAZINE HCL 10 MG PO TABS
10.0000 mg | ORAL_TABLET | Freq: Three times a day (TID) | ORAL | Status: DC
  Administered 2024-05-01 – 2024-05-02 (×4): 10 mg via ORAL
  Filled 2024-05-01 (×4): qty 1

## 2024-05-01 MED ORDER — POLYETHYLENE GLYCOL 3350 17 G PO PACK
17.0000 g | PACK | Freq: Every day | ORAL | Status: DC | PRN
  Filled 2024-05-01: qty 1

## 2024-05-01 MED ORDER — MELATONIN 5 MG PO TABS: 5.0000 mg | ORAL_TABLET | Freq: Every evening | ORAL | Status: DC | PRN

## 2024-05-01 MED ORDER — AZITHROMYCIN 500 MG PO TABS
500.0000 mg | ORAL_TABLET | Freq: Every day | ORAL | Status: AC
  Administered 2024-05-01: 500 mg via ORAL
  Filled 2024-05-01: qty 1

## 2024-05-01 NOTE — Assessment & Plan Note (Signed)
 Continue home Crestor

## 2024-05-01 NOTE — Plan of Care (Signed)
  Problem: Education: Goal: Knowledge of General Education information will improve Description: Including pain rating scale, medication(s)/side effects and non-pharmacologic comfort measures Outcome: Progressing   Problem: Clinical Measurements: Goal: Ability to maintain clinical measurements within normal limits will improve Outcome: Progressing   Problem: Nutrition: Goal: Adequate nutrition will be maintained Outcome: Progressing   Problem: Elimination: Goal: Will not experience complications related to bowel motility Outcome: Progressing   Problem: Pain Managment: Goal: General experience of comfort will improve and/or be controlled Outcome: Progressing   Problem: Safety: Goal: Ability to remain free from injury will improve Outcome: Progressing

## 2024-05-01 NOTE — Assessment & Plan Note (Signed)
 Hypoosmolar hyponatremia.  Likely multifactorial with free water intake and HCTZ both.  HCTZ has been discontinued. Sodium with some improvement to 124 with normal saline. - Continue with normal saline for another day -Patient was advised to use rehydration fluid instead of free water -Monitor sodium

## 2024-05-01 NOTE — Assessment & Plan Note (Signed)
 Improved with repletion. Continue to monitor

## 2024-05-01 NOTE — Assessment & Plan Note (Signed)
 Coarse breath sounds with concern of developing bronchitis - Continue with Tamiflu  for 5 days -DuoNeb Q6 hourly -Started on Z-Pak and prednisone  Current-continue with supportive care

## 2024-05-01 NOTE — H&P (Addendum)
 " History and Physical  Michaela Hardy:[id number] DOB: January 14, 1956 DOA: 04/30/2024  Referring physician: Dr. Neomi, EDP  PCP: [name], NP  Outpatient Specialists: Cardiology. Patient coming from: Home.  Chief Complaint: Shortness of breath, cough, chest wall pain.  HPI: Michaela Hardy is a 68 y.o. female with medical history significant for hypertension on HCTZ, hyperlipidemia, GERD, chronic HFpEF, obesity, former smoker, quit at least 10 years ago, who presents to the ER with complaints of shortness of breath, cough, and chest wall pain for the past several days.  Associated with subjective fevers at home.  Denies vomiting or diarrhea.  Has been compliant with home medications.  In the ER, influenza A+.  Lab studies notable for serum sodium 121 and chloride of 80, BUN 24, creatinine 0.84.  Positive troponin less than 15, less than 15.  Euvolemic on exam.  The patient received IV fluid NS in the ER.  Admitted by [facility], hospitalist service.  ED Course: Temperature 98.3.  BP 151/65, pulse 76, respiration rate 12, O2 saturation 93% on room air.  Review of Systems: Review of systems as noted in the HPI. All other systems reviewed and are negative.   Past Medical History:  Diagnosis Date   Anxiety    Depression    GERD (gastroesophageal reflux disease)    Hepatitis    Primary bilary cholangitis   Hyperlipidemia    Hypertension    PONV (postoperative nausea and vomiting)    Past Surgical History:  Procedure Laterality Date   APPENDECTOMY  1976   BREAST BIOPSY Left 10/03/2014   Dr. Avanell   BREAST EXCISIONAL BIOPSY Left    BREAST LUMPECTOMY WITH RADIOACTIVE SEED LOCALIZATION Left 11/21/2014   Procedure: LEFT BREAST LUMPECTOMY WITH RADIOACTIVE SEED LOCALIZATION;  Surgeon: Deward Null III, MD;  Location: [facility];  Service: General;  Laterality: Left;   CERVICAL BIOPSY  W/ LOOP ELECTRODE EXCISION     CHOLECYSTECTOMY  06/29/2007   LIVER BIOPSY  2008   TOTAL  HIP ARTHROPLASTY Left 09/2022   TUBAL LIGATION  1987    Social History:  reports that she quit smoking about 16 years ago. Her smoking use included cigarettes. She started smoking about 54 years ago. She has a 55.5 pack-year smoking history. She has never used smokeless tobacco. She reports current alcohol use of about 4.0 standard drinks of alcohol per week. She reports that she does not use drugs.   Allergies[1]  Family History  Problem Relation Age of Onset   Hypertension Mother    Pancreatitis Mother    Thyroid  disease Mother    Hypertension Father    Heart attack Father    Cerebrovascular Accident Father    Coronary artery disease Father    Glaucoma Father    Cancer Father        lung   Parkinson's disease Maternal Grandmother    Breast cancer Neg Hx    Colon cancer Neg Hx    Ovarian cancer Neg Hx       Prior to Admission medications  Medication Sig Start Date End Date Taking? Authorizing Provider  calcium  carbonate (OS-CAL) 600 MG TABS tablet Take 1 tablet by mouth daily with breakfast.     [provider]  cyanocobalamin  (VITAMIN B12) 1000 MCG tablet Take 1,000 mcg by mouth daily.    [provider]  hydrALAZINE  (APRESOLINE ) 10 MG tablet TAKE 1 TABLET BY MOUTH 3 TIMES  DAILY 12/14/23   [name], NP  hydrochlorothiazide  (HYDRODIURIL ) 25  MG tablet TAKE 1 TABLET BY MOUTH DAILY 04/03/24   [name], NP  Multiple Vitamin tablet Take 1 tablet by mouth daily.    [provider]  omeprazole (PRILOSEC) 20 MG capsule Take 1 capsule by mouth daily.    [provider]  rosuvastatin  (CRESTOR ) 10 MG tablet TAKE 1 TABLET BY MOUTH DAILY 12/14/23   [name], NP  telmisartan  (MICARDIS ) 80 MG tablet TAKE 1 TABLET BY MOUTH DAILY 04/03/24   [name], NP  ursodiol  (ACTIGALL ) 300 MG capsule Take 2 capsules by mouth 2 (two) times daily.    [provider]    Physical Exam: BP 124/69   Pulse 78   Temp 98.4 F (36.9  C) (Oral)   Resp 13   SpO2 97%   General: 68 y.o. year-old female well developed well nourished in no acute distress.  Alert and oriented x3. Cardiovascular: Regular rate and rhythm with no rubs or gallops.  No thyromegaly or JVD noted.  No lower extremity edema. 2/4 pulses in all 4 extremities. Respiratory: Diffuse wheezing bilaterally. Good inspiratory effort. Abdomen: Soft nontender nondistended with normal bowel sounds x4 quadrants. Muskuloskeletal: No cyanosis, clubbing or edema noted bilaterally Neuro: CN II-XII intact, strength, sensation, reflexes Skin: No ulcerative lesions noted or rashes Psychiatry: Judgement and insight appear normal. Mood is appropriate for condition and setting          Labs on Admission:  Basic Metabolic Panel: Recent Labs  Lab 04/30/24 2051  NA 121*  K 2.8*  CL 80*  CO2 26  GLUCOSE 125*  BUN 24*  CREATININE 0.84  CALCIUM  9.0   Liver Function Tests: No results for input(s): AST, ALT, ALKPHOS, BILITOT, PROT, ALBUMIN in the last 168 hours. No results for input(s): LIPASE, AMYLASE in the last 168 hours. No results for input(s): AMMONIA in the last 168 hours. CBC: Recent Labs  Lab 04/30/24 2051  WBC 7.4  HGB 12.3  HCT 34.5*  MCV 83.7  PLT 349   Cardiac Enzymes: No results for input(s): CKTOTAL, CKMB, CKMBINDEX, TROPONINI in the last 168 hours.  BNP (last 3 results) No results for input(s): BNP in the last 8760 hours.  ProBNP (last 3 results) No results for input(s): PROBNP in the last 8760 hours.  CBG: No results for input(s): GLUCAP in the last 168 hours.  Radiological Exams on Admission: DG Chest 2 View Result Date: 04/30/2024 CLINICAL DATA:  Chest pressure.  Shortness of breath.  Cough. EXAM: DG CHEST 2V COMPARISON:  None Available. FINDINGS: The cardiomediastinal contours are normal. Aortic atherosclerosis. Mild hyperinflation and diffuse bronchial thickening. Pulmonary vasculature is normal. No  consolidation, pleural effusion, or pneumothorax. No acute osseous abnormalities are seen. IMPRESSION: Mild hyperinflation and bronchial thickening, can be seen with smoking sequela, bronchitis or asthma. Electronically Signed   By: Andrea Gasman M.D.   On: 04/30/2024 21:23    EKG: I independently viewed the EKG done and my findings are as followed: Sinus rhythm rate of 78.  QTc 460.  Assessment/Plan Present on Admission:  Hyponatremia  Principal Problem:   Hyponatremia  Hyponatremia, suspect contributed by hydrochlorothiazide  Hold off home hydrochlorothiazide  Follow urine osmolality, urine sodium, serum osmolality. Continue IV fluid NS at 100 cc/h x 1 day Avoid rapid correction of sodium BMP every 4 hours x 3  Influenza A infection Tamiflu  75 mg twice daily x 5 days. DuoNebs every 6 hours for wheezing and shortness of breath As needed antitussives Early mobilization  Hypertension, BP is elevated Resume home  oral antihypertensive Monitor vital signs  Hyperlipidemia Resume home regimen  Obesity BMI 38 Recommend weight loss outpatient with regular physical activity and healthy diet.   Time: 75 minutes.    DVT prophylaxis: Subcu Lovenox  daily  Code Status: Full code.  Family Communication: None at bedside.  Disposition Plan: Admitted to telemetry unit  Consults called: None.  Admission status: Inpatient status.   Status is: Inpatient The patient requires at least 2 midnight for further evaluation and treatment of present condition.   Terry LOISE Hurst MD [facility] Pager [phone]  If 7PM-7AM, please contact night-coverage [email or web address] Password [facility]  05/01/2024, 12:32 AM      [1]  Allergies Allergen Reactions   Amlodipine  Other (See Comments)    headache   Nifedipine  Other (See Comments)    Flushing and red face; headache   "

## 2024-05-01 NOTE — Assessment & Plan Note (Signed)
-   Continue home irbesartan  -Holding HCTZ

## 2024-05-01 NOTE — Progress Notes (Deleted)
 "  There were no vitals taken for this visit.   Subjective:    Patient ID: Michaela Hardy, female    DOB: 02-15-1956, 68 y.o.   MRN: [id number]  HPI: Michaela Hardy is a 68 y.o. female  No chief complaint on file.  UPPER RESPIRATORY TRACT INFECTION Worst symptom: Fever: {Blank single:19197::yes,no} Cough: {Blank single:19197::yes,no} Shortness of breath: {Blank single:19197::yes,no} Wheezing: {Blank single:19197::yes,no} Chest pain: {Blank single:19197::yes,no,yes, with cough} Chest tightness: {Blank single:19197::yes,no} Chest congestion: {Blank single:19197::yes,no} Nasal congestion: {Blank single:19197::yes,no} Runny nose: {Blank single:19197::yes,no} Post nasal drip: {Blank single:19197::yes,no} Sneezing: {Blank single:19197::yes,no} Sore throat: {Blank single:19197::yes,no} Swollen glands: {Blank single:19197::yes,no} Sinus pressure: {Blank single:19197::yes,no} Headache: {Blank single:19197::yes,no} Face pain: {Blank single:19197::yes,no} Toothache: {Blank single:19197::yes,no} Ear pain: {Blank single:19197::yes,no} {Blank single:19197::right,left, bilateral} Ear pressure: {Blank single:19197::yes,no} {Blank single:19197::right,left, bilateral} Eyes red/itching:{Blank single:19197::yes,no} Eye drainage/crusting: {Blank single:19197::yes,no}  Vomiting: {Blank single:19197::yes,no} Rash: {Blank single:19197::yes,no} Fatigue: {Blank single:19197::yes,no} Sick contacts: {Blank single:19197::yes,no} Strep contacts: {Blank single:19197::yes,no}  Context: {Blank multiple:19196::better,worse,stable,fluctuating} Recurrent sinusitis: {Blank single:19197::yes,no} Relief with OTC cold/cough medications: {Blank single:19197::yes,no}  Treatments attempted: {Blank  multiple:19196::none,cold/sinus,mucinex ,anti-histamine,pseudoephedrine,cough syrup,antibiotics}   Relevant past medical, surgical, family and social history reviewed and updated as indicated. Interim medical history since our last visit reviewed. Allergies and medications reviewed and updated.  Review of Systems  Per HPI unless specifically indicated above     Objective:    There were no vitals taken for this visit.  Wt Readings from Last 3 Encounters:  05/01/24 238 lb 9.6 oz (108.2 kg)  03/06/24 217 lb 6.4 oz (98.6 kg)  10/25/23 217 lb 12.8 oz (98.8 kg)    Physical Exam  Results for orders placed or performed during the hospital encounter of 04/30/24  Resp panel by RT-PCR (RSV, Flu A&B, Covid) Anterior Nasal Swab   Collection Time: 04/30/24  8:50 PM   Specimen: Anterior Nasal Swab  Result Value Ref Range   SARS Coronavirus 2 by RT PCR NEGATIVE NEGATIVE   Influenza A by PCR POSITIVE (A) NEGATIVE   Influenza B by PCR NEGATIVE NEGATIVE   Resp Syncytial Virus by PCR NEGATIVE NEGATIVE  Basic metabolic panel   Collection Time: 04/30/24  8:51 PM  Result Value Ref Range   Sodium 121 (L) 135 - 145 mmol/L   Potassium 2.8 (L) 3.5 - 5.1 mmol/L   Chloride 80 (L) 98 - 111 mmol/L   CO2 26 22 - 32 mmol/L   Glucose, Bld 125 (H) 70 - 99 mg/dL   BUN 24 (H) 8 - 23 mg/dL   Creatinine, Ser 9.15 0.44 - 1.00 mg/dL   Calcium  9.0 8.9 - 10.3 mg/dL   GFR, Estimated >39 >39 mL/min   Anion gap 14 5 - 15  CBC   Collection Time: 04/30/24  8:51 PM  Result Value Ref Range   WBC 7.4 4.0 - 10.5 K/uL   RBC 4.12 3.87 - 5.11 MIL/uL   Hemoglobin 12.3 12.0 - 15.0 g/dL   HCT 65.4 (L) 63.9 - 53.9 %   MCV 83.7 80.0 - 100.0 fL   MCH 29.9 26.0 - 34.0 pg   MCHC 35.7 30.0 - 36.0 g/dL   RDW 87.6 88.4 - 84.4 %   Platelets 349 150 - 400 K/uL   nRBC 0.0 0.0 - 0.2 %  Troponin T, High Sensitivity   Collection Time: 04/30/24  8:51 PM  Result Value Ref Range   Troponin T High Sensitivity <15 0 - 19  ng/L  Magnesium   Collection Time: 04/30/24 10:55 PM  Result Value Ref Range  Magnesium 2.1 1.7 - 2.4 mg/dL  Troponin T, High Sensitivity   Collection Time: 04/30/24 10:55 PM  Result Value Ref Range   Troponin T High Sensitivity <15 0 - 19 ng/L  TSH   Collection Time: 05/01/24 12:29 AM  Result Value Ref Range   TSH 5.880 (H) 0.350 - 4.500 uIU/mL  Osmolality   Collection Time: 05/01/24 12:31 AM  Result Value Ref Range   Osmolality 252 (L) 275 - 295 mOsm/kg  Basic metabolic panel   Collection Time: 05/01/24  2:56 AM  Result Value Ref Range   Sodium 120 (L) 135 - 145 mmol/L   Potassium 4.0 3.5 - 5.1 mmol/L   Chloride 84 (L) 98 - 111 mmol/L   CO2 24 22 - 32 mmol/L   Glucose, Bld 141 (H) 70 - 99 mg/dL   BUN 19 8 - 23 mg/dL   Creatinine, Ser 9.40 0.44 - 1.00 mg/dL   Calcium  8.3 (L) 8.9 - 10.3 mg/dL   GFR, Estimated >39 >39 mL/min   Anion gap 12 5 - 15  Basic metabolic panel   Collection Time: 05/01/24  5:00 AM  Result Value Ref Range   Sodium 121 (L) 135 - 145 mmol/L   Potassium 3.9 3.5 - 5.1 mmol/L   Chloride 84 (L) 98 - 111 mmol/L   CO2 22 22 - 32 mmol/L   Glucose, Bld 147 (H) 70 - 99 mg/dL   BUN 18 8 - 23 mg/dL   Creatinine, Ser 9.40 0.44 - 1.00 mg/dL   Calcium  8.4 (L) 8.9 - 10.3 mg/dL   GFR, Estimated >39 >39 mL/min   Anion gap 15 5 - 15      Assessment & Plan:   Problem List Items Addressed This Visit   None    Follow up plan: No follow-ups on file.   This visit was completed via MyChart due to the restrictions of the COVID-19 pandemic. All issues as above were discussed and addressed. Physical exam was done as above through visual confirmation on MyChart. If it was felt that the patient should be evaluated in the office, they were directed there. The patient verbally consented to this visit. Location of the patient: *** Location of the provider: Office Those involved with this call:  Provider: Darice Petty, NP CMA: *** Front Desk/Registration:  *** This encounter was conducted via ***.  I spent *** dedicated to the care of this patient on the date of this encounter to include previsit review of ***, face to face time with the patient, and post visit ordering of testing.      "

## 2024-05-01 NOTE — Hospital Course (Addendum)
 Partly taken from H&P.  Michaela Hardy is a 68 y.o. female with medical history significant for hypertension on HCTZ, hyperlipidemia, GERD, chronic HFpEF, obesity, former smoker, quit at least 10 years ago, who presents to the ER with complaints of shortness of breath, cough, and chest wall pain for the past several days.  Associated with subjective fevers at home.   On presentation hemodynamically stable, labs pertinent for influenza A positive, hyponatremia with sodium at 121, barely positive troponin at 15.  Patient received IV fluid in ER and admitted for hyponatremia.  12/23: Blood pressure elevated at 162/69, sodium at 124, hyponatremia labs decrease serum osmolality at 252, decreased urine osmolality and urine sodium less than 30, mildly elevated TSH at 5.880. Restrict free water intake.  12/24: Hemodynamically stable, sodium improved to 134, patient was feeling much improved and close to baseline.  She is being discharged for few more days of prednisone , Zithromax  and Tamiflu .  Home hydrochlorothiazide  was discontinued and patient was advised to have a close follow-up with primary care provider and they can adjust her other blood pressure medications if needed.  Blood pressure currently close to goal.  She will continue current medications except hydrochlorothiazide  and follow-up with her providers for further assistance.

## 2024-05-01 NOTE — Progress Notes (Signed)
" °  Progress Note   Patient: Michaela Hardy FMW:[id number] DOB: 23-May-1955 DOA: 04/30/2024     0 DOS: the patient was seen and examined on 05/01/2024   Brief hospital course: Partly taken from H&P.  Michaela Hardy is a 68 y.o. female with medical history significant for hypertension on HCTZ, hyperlipidemia, GERD, chronic HFpEF, obesity, former smoker, quit at least 10 years ago, who presents to the ER with complaints of shortness of breath, cough, and chest wall pain for the past several days.  Associated with subjective fevers at home.   On presentation hemodynamically stable, labs pertinent for influenza A positive, hyponatremia with sodium at 121, barely positive troponin at 15.  Patient received IV fluid in ER and admitted for hyponatremia.  12/23: Blood pressure elevated at 162/69, sodium at 124, hyponatremia labs decrease serum osmolality at 252, decreased urine osmolality and urine sodium less than 30, mildly elevated TSH at 5.880. Restrict free water intake.  Assessment and Plan: * Hyponatremia Hypoosmolar hyponatremia.  Likely multifactorial with free water intake and HCTZ both.  HCTZ has been discontinued. Sodium with some improvement to 124 with normal saline. - Continue with normal saline for another day -Patient was advised to use rehydration fluid instead of free water -Monitor sodium  Influenza A H1N1 infection Coarse breath sounds with concern of developing bronchitis - Continue with Tamiflu  for 5 days -DuoNeb Q6 hourly -Started on Z-Pak and prednisone  Current-continue with supportive care  Essential hypertension - Continue home irbesartan  -Holding HCTZ  Hypokalemia Improved with repletion. - Continue to monitor  Hyperlipidemia - Continue home Crestor    Subjective: Patient was sitting in chair when seen today.  Stating that she is feeling little better, having some cough and chest tightness.  Per patient she drink a lot of free water to keep herself  hydrated.  Physical Exam: Vitals:   05/01/24 0930 05/01/24 1109 05/01/24 1130 05/01/24 1417  BP: 120/66  138/67   Pulse: 94  82   Resp: 19  18   Temp:   98.1 F (36.7 C)   TempSrc:      SpO2: 96%  95% 100%  Weight:      Height:  5' 7 (1.702 m)     General.  Obese lady, in no acute distress. Pulmonary.  Mildly coarse breath sounds bilaterally, normal respiratory effort. CV.  Regular rate and rhythm, no JVD, rub or murmur. Abdomen.  Soft, nontender, nondistended, BS positive. CNS.  Alert and oriented .  No focal neurologic deficit. Extremities.  No edema,  pulses intact and symmetrical. Psychiatry.  Judgment and insight appears normal.   Data Reviewed: Prior data reviewed  Family Communication: Discussed with patient  Disposition: Status is: Inpatient Remains inpatient appropriate because: Severity of illness  Planned Discharge Destination: Home  DVT prophylaxis.  Lovenox  Time spent: 50 minutes  This record has been created using Conservation officer, historic buildings. Errors have been sought and corrected,but may not always be located. Such creation errors do not reflect on the standard of care.   Author: Amaryllis Dare, MD 05/01/2024 3:42 PM  For on call review [email or web address].  "

## 2024-05-01 NOTE — Progress Notes (Signed)
 PHARMACIST - PHYSICIAN COMMUNICATION  CONCERNING:  Enoxaparin  (Lovenox ) for DVT Prophylaxis    RECOMMENDATION: Patient was prescribed enoxaprin 40mg  q24 hours for VTE prophylaxis.   Filed Weights   05/01/24 0352  Weight: 108.2 kg (238 lb 9.6 oz)    Body mass index is 37.37 kg/m.  Estimated Creatinine Clearance: 86.4 mL/min (by C-G formula based on SCr of 0.59 mg/dL).   Based on [facility] policy patient is candidate for enoxaparin  0.5mg /kg TBW SQ every 24 hours based on BMI being >30.  DESCRIPTION: Pharmacy has adjusted enoxaparin  dose per [facility] policy.  Patient is now receiving enoxaparin  0.5 mg/kg every 24 hours   Rankin CANDIE Dills, PharmD, [facility] 05/01/2024 3:55 AM

## 2024-05-02 ENCOUNTER — Other Ambulatory Visit: Payer: Self-pay

## 2024-05-02 DIAGNOSIS — E876 Hypokalemia: Secondary | ICD-10-CM | POA: Diagnosis not present

## 2024-05-02 DIAGNOSIS — I10 Essential (primary) hypertension: Secondary | ICD-10-CM

## 2024-05-02 DIAGNOSIS — J101 Influenza due to other identified influenza virus with other respiratory manifestations: Secondary | ICD-10-CM

## 2024-05-02 DIAGNOSIS — E871 Hypo-osmolality and hyponatremia: Secondary | ICD-10-CM

## 2024-05-02 DIAGNOSIS — E785 Hyperlipidemia, unspecified: Secondary | ICD-10-CM

## 2024-05-02 DIAGNOSIS — J40 Bronchitis, not specified as acute or chronic: Secondary | ICD-10-CM | POA: Diagnosis not present

## 2024-05-02 LAB — BASIC METABOLIC PANEL WITH GFR
Anion gap: 8 (ref 5–15)
BUN: 11 mg/dL (ref 8–23)
CO2: 27 mmol/L (ref 22–32)
Calcium: 8.5 mg/dL — ABNORMAL LOW (ref 8.9–10.3)
Chloride: 100 mmol/L (ref 98–111)
Creatinine, Ser: 0.63 mg/dL (ref 0.44–1.00)
GFR, Estimated: >60 mL/min
Glucose, Bld: 131 mg/dL — ABNORMAL HIGH (ref 70–99)
Potassium: 3.5 mmol/L (ref 3.5–5.1)
Sodium: 136 mmol/L (ref 135–145)

## 2024-05-02 LAB — CBC
HCT: 32.6 % — ABNORMAL LOW (ref 36.0–46.0)
Hemoglobin: 11.1 g/dL — ABNORMAL LOW (ref 12.0–15.0)
MCH: 29.5 pg (ref 26.0–34.0)
MCHC: 34 g/dL (ref 30.0–36.0)
MCV: 86.7 fL (ref 80.0–100.0)
Platelets: 356 K/uL (ref 150–400)
RBC: 3.76 MIL/uL — ABNORMAL LOW (ref 3.87–5.11)
RDW: 12.6 % (ref 11.5–15.5)
WBC: 6.5 K/uL (ref 4.0–10.5)
nRBC: 0 % (ref 0.0–0.2)

## 2024-05-02 LAB — HIV ANTIBODY (ROUTINE TESTING W REFLEX): HIV Screen 4th Generation wRfx: NONREACTIVE

## 2024-05-02 MED ORDER — OSELTAMIVIR PHOSPHATE 75 MG PO CAPS
75.0000 mg | ORAL_CAPSULE | Freq: Two times a day (BID) | ORAL | 0 refills | Status: AC
  Filled 2024-05-02: qty 6, 3d supply, fill #0

## 2024-05-02 MED ORDER — DEXTROMETHORPHAN-GUAIFENESIN 20-200 MG/20ML PO LIQD
10.0000 mL | ORAL | 0 refills | Status: DC | PRN
  Filled 2024-05-02: qty 118, 1d supply, fill #0

## 2024-05-02 MED ORDER — PREDNISONE 20 MG PO TABS
40.0000 mg | ORAL_TABLET | Freq: Every day | ORAL | 0 refills | Status: AC
  Filled 2024-05-02: qty 6, 3d supply, fill #0

## 2024-05-02 MED ORDER — AZITHROMYCIN 250 MG PO TABS
250.0000 mg | ORAL_TABLET | Freq: Every day | ORAL | 0 refills | Status: AC
  Filled 2024-05-02: qty 3, 3d supply, fill #0

## 2024-05-02 NOTE — Discharge Summary (Signed)
 " Physician Discharge Summary   Patient: Michaela Hardy MRN: [id number] DOB: [birth year]  Admit date:     04/30/2024  Discharge date: 05/02/2024  Discharge Physician: Amaryllis Dare   PCP: [name], NP   Recommendations at discharge:  Please obtain CBC and BMP on follow-up We discontinued hydrochlorothiazide  for concern of hyponatremia, please consider increasing the dose of hydralazine  if needed. Follow-up with primary care provider within a week  Discharge Diagnoses: Principal Problem:   Hyponatremia Active Problems:   Influenza A   Essential hypertension   Hypokalemia   Hyperlipidemia   Bronchitis   Hospital Course: Partly taken from H&P.  Michaela Hardy is a 68 y.o. female with medical history significant for hypertension on HCTZ, hyperlipidemia, GERD, chronic HFpEF, obesity, former smoker, quit at least 10 years ago, who presents to the ER with complaints of shortness of breath, cough, and chest wall pain for the past several days.  Associated with subjective fevers at home.   On presentation hemodynamically stable, labs pertinent for influenza A positive, hyponatremia with sodium at 121, barely positive troponin at 15.  Patient received IV fluid in ER and admitted for hyponatremia.  12/23: Blood pressure elevated at 162/69, sodium at 124, hyponatremia labs decrease serum osmolality at 252, decreased urine osmolality and urine sodium less than 30, mildly elevated TSH at 5.880. Restrict free water intake.  12/24: Hemodynamically stable, sodium improved to 134, patient was feeling much improved and close to baseline.  She is being discharged for few more days of prednisone , Zithromax  and Tamiflu .  Home hydrochlorothiazide  was discontinued and patient was advised to have a close follow-up with primary care provider and they can adjust her other blood pressure medications if needed.  Blood pressure currently close to goal.  She will continue current medications except  hydrochlorothiazide  and follow-up with her providers for further assistance.  Assessment and Plan: * Hyponatremia Hypoosmolar hyponatremia.  Likely multifactorial with free water intake and HCTZ both.  HCTZ has been discontinued. Sodium with some improvement to 124 with normal saline. - Continue with normal saline for another day -Patient was advised to use rehydration fluid instead of free water  Influenza A Coarse breath sounds with concern of developing bronchitis - Continue with Tamiflu  for 5 days -DuoNeb Q6 hourly -Started on Z-Pak and prednisone  Current-continue with supportive care  Essential hypertension - Continue home irbesartan  and hydralazine -hydralazine  dose can be increased if needed -Holding HCTZ  Hypokalemia Improved with repletion. - Continue to monitor  Hyperlipidemia - Continue home Crestor   Consultants: None Procedures performed: None Disposition: Home Diet recommendation:  Regular diet DISCHARGE MEDICATION: Allergies as of 05/02/2024       Reactions   Amlodipine  Other (See Comments)   headache   Nifedipine  Other (See Comments)   Flushing and red face; headache        Medication List     STOP taking these medications    hydrochlorothiazide  25 MG tablet Commonly known as: HYDRODIURIL        TAKE these medications    azithromycin  250 MG tablet Commonly known as: ZITHROMAX  Take 1 tablet (250 mg total) by mouth daily for 3 days.   calcium  carbonate 600 MG Tabs tablet Commonly known as: OS-CAL Take 1 tablet by mouth daily with breakfast.   cyanocobalamin  1000 MCG tablet Commonly known as: VITAMIN B12 Take 1,000 mcg by mouth daily.   hydrALAZINE  10 MG tablet Commonly known as: APRESOLINE  TAKE 1 TABLET BY MOUTH 3 TIMES  DAILY   Multiple Vitamin  tablet Take 1 tablet by mouth daily.   omeprazole 20 MG capsule Commonly known as: PRILOSEC Take 1 capsule by mouth daily.   oseltamivir  75 MG capsule Commonly known as: TAMIFLU  Take  1 capsule (75 mg total) by mouth 2 (two) times daily for 3 days.   predniSONE  20 MG tablet Commonly known as: DELTASONE  Take 2 tablets (40 mg total) by mouth daily with breakfast for 3 days.   Robafen DM 20-200 MG/20ML Liqd Generic drug: Dextromethorphan -guaiFENesin  Take 10 mLs by mouth every 4 (four) hours as needed for cough.   rosuvastatin  10 MG tablet Commonly known as: CRESTOR  TAKE 1 TABLET BY MOUTH DAILY   telmisartan  80 MG tablet Commonly known as: MICARDIS  TAKE 1 TABLET BY MOUTH DAILY   ursodiol  300 MG capsule Commonly known as: ACTIGALL  Take 2 capsules by mouth 2 (two) times daily.        Follow-up Information     [name], NP. Go on 05/15/2024.   Specialty: Nurse Practitioner Why: @ 1pm Contact information: [address] [place] [address] [phone]                Discharge Exam: Michaela Hardy   05/01/24 0352  Weight: 108.2 kg   General.  Obese lady, in no acute distress. Pulmonary.  Lungs clear bilaterally, normal respiratory effort. CV.  Regular rate and rhythm, no JVD, rub or murmur. Abdomen.  Soft, nontender, nondistended, BS positive. CNS.  Alert and oriented .  No focal neurologic deficit. Extremities.  No edema, no cyanosis, pulses intact and symmetrical. Psychiatry.  Judgment and insight appears normal.   Condition at discharge: stable  The results of significant diagnostics from this hospitalization (including imaging, microbiology, ancillary and laboratory) are listed below for reference.   Imaging Studies: DG Chest 2 View Result Date: 04/30/2024 CLINICAL DATA:  Chest pressure.  Shortness of breath.  Cough. EXAM: DG CHEST 2V COMPARISON:  None Available. FINDINGS: The cardiomediastinal contours are normal. Aortic atherosclerosis. Mild hyperinflation and diffuse bronchial thickening. Pulmonary vasculature is normal. No consolidation, pleural effusion, or pneumothorax. No acute osseous abnormalities are seen. IMPRESSION:  Mild hyperinflation and bronchial thickening, can be seen with smoking sequela, bronchitis or asthma. Electronically Signed   By: Andrea Gasman M.D.   On: 04/30/2024 21:23    Microbiology: Results for orders placed or performed during the hospital encounter of 04/30/24  Resp panel by RT-PCR (RSV, Flu A&B, Covid) Anterior Nasal Swab     Status: Abnormal   Collection Time: 04/30/24  8:50 PM   Specimen: Anterior Nasal Swab  Result Value Ref Range Status   SARS Coronavirus 2 by RT PCR NEGATIVE NEGATIVE Final    Comment: (NOTE) SARS-CoV-2 target nucleic acids are NOT DETECTED.  The SARS-CoV-2 RNA is generally detectable in upper respiratory specimens during the acute phase of infection. The lowest concentration of SARS-CoV-2 viral copies this assay can detect is 138 copies/mL. A negative result does not preclude SARS-Cov-2 infection and should not be used as the sole basis for treatment or other patient management decisions. A negative result may occur with  improper specimen collection/handling, submission of specimen other than nasopharyngeal swab, presence of viral mutation(s) within the areas targeted by this assay, and inadequate number of viral copies(<138 copies/mL). A negative result must be combined with clinical observations, patient history, and epidemiological information. The expected result is Negative.  Fact Sheet for Patients:  bloggercourse.com  Fact Sheet for Healthcare Providers:  seriousbroker.it  This test is no t yet approved  or cleared by the [place]  FDA and  has been authorized for detection and/or diagnosis of SARS-CoV-2 by FDA under an Emergency Use Authorization (EUA). This EUA will remain  in effect (meaning this test can be used) for the duration of the COVID-19 declaration under Section 564(b)(1) of the Act, 21 U.S.C.section 360bbb-3(b)(1), unless the authorization is terminated  or revoked  sooner.       Influenza A by PCR POSITIVE (A) NEGATIVE Final   Influenza B by PCR NEGATIVE NEGATIVE Final    Comment: (NOTE) The Xpert Xpress SARS-CoV-2/FLU/RSV plus assay is intended as an aid in the diagnosis of influenza from Nasopharyngeal swab specimens and should not be used as a sole basis for treatment. Nasal washings and aspirates are unacceptable for Xpert Xpress SARS-CoV-2/FLU/RSV testing.  Fact Sheet for Patients: bloggercourse.com  Fact Sheet for Healthcare Providers: seriousbroker.it  This test is not yet approved or cleared by the [place]  FDA and has been authorized for detection and/or diagnosis of SARS-CoV-2 by FDA under an Emergency Use Authorization (EUA). This EUA will remain in effect (meaning this test can be used) for the duration of the COVID-19 declaration under Section 564(b)(1) of the Act, 21 U.S.C. section 360bbb-3(b)(1), unless the authorization is terminated or revoked.     Resp Syncytial Virus by PCR NEGATIVE NEGATIVE Final    Comment: (NOTE) Fact Sheet for Patients: bloggercourse.com  Fact Sheet for Healthcare Providers: seriousbroker.it  This test is not yet approved or cleared by the [place]  FDA and has been authorized for detection and/or diagnosis of SARS-CoV-2 by FDA under an Emergency Use Authorization (EUA). This EUA will remain in effect (meaning this test can be used) for the duration of the COVID-19 declaration under Section 564(b)(1) of the Act, 21 U.S.C. section 360bbb-3(b)(1), unless the authorization is terminated or revoked.  Performed at [facility], [address] [place], [place] [address]     Labs: CBC: Recent Labs  Lab 04/30/24 2051 05/02/24 0552  WBC 7.4 6.5  HGB 12.3 11.1*  HCT 34.5* 32.6*  MCV 83.7 86.7  PLT 349 356   Basic Metabolic Panel: Recent Labs  Lab 04/30/24 2051  04/30/24 2255 05/01/24 0256 05/01/24 0500 05/01/24 0958 05/02/24 0552  NA 121*  --  120* 121* 124* 136  K 2.8*  --  4.0 3.9 3.8 3.5  CL 80*  --  84* 84* 87* 100  CO2 26  --  24 22 22 27   GLUCOSE 125*  --  141* 147* 276* 131*  BUN 24*  --  19 18 15 11   CREATININE 0.84  --  0.59 0.59 0.71 0.63  CALCIUM  9.0  --  8.3* 8.4* 8.7* 8.5*  MG  --  2.1  --   --   --   --    Liver Function Tests: No results for input(s): AST, ALT, ALKPHOS, BILITOT, PROT, ALBUMIN in the last 168 hours. CBG: No results for input(s): GLUCAP in the last 168 hours.  Discharge time spent: greater than 30 minutes.  This record has been created using Conservation officer, historic buildings. Errors have been sought and corrected,but may not always be located. Such creation errors do not reflect on the standard of care.   Signed: Amaryllis Dare, MD [facility] 05/02/2024 "

## 2024-05-02 NOTE — Plan of Care (Signed)

## 2024-05-05 LAB — OSMOLALITY, URINE: Osmolality, Ur: 199 mosm/kg — ABNORMAL LOW (ref 300–900)

## 2024-05-07 ENCOUNTER — Telehealth: Payer: Self-pay

## 2024-05-07 NOTE — Telephone Encounter (Unsigned)
 Copied from CRM #[id number]. Topic: Appointments - Scheduling Inquiry for Clinic >> May 07, 2024 10:01 AM Michaela Hardy wrote: Reason for CRM: Patient has a hospital follow up until 05/15/2024, but she does not feel comfortable waiting that long, would like a sooner app or more medication called in.   Please give her a call.

## 2024-05-08 NOTE — Telephone Encounter (Signed)
 Scheduled

## 2024-05-08 NOTE — Telephone Encounter (Signed)
 Provider came and verbally asked for me to contact the patient and see if she can come in to see Delon Benders tomorrow for a follow up. Ok'd per office administrator.   Called and spoke to patient. She will be coming at 10:40 to see [facility] tomorrow. Please add her to Jennifer's schedule.

## 2024-05-08 NOTE — Telephone Encounter (Signed)
 Unfortunately, I do not have any openings to get her in or add her to my schedule before her appointment next week.

## 2024-05-09 ENCOUNTER — Ambulatory Visit (INDEPENDENT_AMBULATORY_CARE_PROVIDER_SITE_OTHER)

## 2024-05-09 VITALS — BP 138/84 | HR 76 | Temp 97.7°F | Ht 67.0 in | Wt 216.8 lb

## 2024-05-09 DIAGNOSIS — J111 Influenza due to unidentified influenza virus with other respiratory manifestations: Secondary | ICD-10-CM | POA: Diagnosis not present

## 2024-05-09 DIAGNOSIS — I10 Essential (primary) hypertension: Secondary | ICD-10-CM | POA: Diagnosis not present

## 2024-05-09 DIAGNOSIS — E871 Hypo-osmolality and hyponatremia: Secondary | ICD-10-CM | POA: Diagnosis not present

## 2024-05-09 MED ORDER — DEXTROMETHORPHAN-GUAIFENESIN 20-200 MG/20ML PO LIQD: 10.0000 mL | ORAL | 0 refills | Status: AC | PRN

## 2024-05-09 MED ORDER — PREDNISONE 10 MG (21) PO TBPK: ORAL_TABLET | ORAL | 0 refills | Status: AC

## 2024-05-09 NOTE — Assessment & Plan Note (Signed)
 Hospitalist told pt to discontinue hydrochlorothiazide  due to hyponatremia.  Pt states her bp has been controlled since admission and she is just taking hydralazine  as needed.  Encouraged her to take her bp at home and bring log to appt next week with Darice, NP.

## 2024-05-09 NOTE — Progress Notes (Signed)
 "  There were no vitals taken for this visit.   Subjective:    Patient ID: Michaela Hardy, female    DOB: 1956-05-10, 68 y.o.   MRN: [id number]  HPI: Michaela Hardy is a 68 y.o. female presenting for follow up after a hospital admission.  She is coughing a lot during the night.  She reports some wheezing, coughing.  Denies HA, myalgias, chills, fevers.  She discontinued hydrochlorothiazide  due to hyponatremia.    Chief Complaint  Patient presents with   Hospitalization Follow-up    Admitted 12/22 for Flu and Bronchitis    Cough   Wheezing    Relevant past medical, surgical, family and social history reviewed and updated as indicated. Interim medical history since our last visit reviewed. Allergies and medications reviewed and updated.  Review of Systems  Per HPI unless specifically indicated above     Objective:    There were no vitals taken for this visit.  Wt Readings from Last 3 Encounters:  05/01/24 238 lb 9.6 oz (108.2 kg)  03/06/24 217 lb 6.4 oz (98.6 kg)  10/25/23 217 lb 12.8 oz (98.8 kg)    Physical Exam Vitals and nursing note reviewed.  Constitutional:      General: She is not in acute distress.    Appearance: Normal appearance. She is obese. She is not toxic-appearing.  HENT:     Head: Normocephalic and atraumatic.     Right Ear: Tympanic membrane normal.     Left Ear: Tympanic membrane normal.     Nose: Congestion present.     Mouth/Throat:     Mouth: Mucous membranes are moist.  Eyes:     Pupils: Pupils are equal, round, and reactive to light.  Cardiovascular:     Rate and Rhythm: Normal rate and regular rhythm.  Pulmonary:     Breath sounds: Rhonchi present. No wheezing.  Chest:     Chest wall: No tenderness.  Abdominal:     General: Abdomen is flat. There is no distension.     Palpations: Abdomen is soft.  Musculoskeletal:        General: No swelling.  Skin:    General: Skin is warm and dry.  Neurological:     Mental Status: She is alert.      Results for orders placed or performed during the hospital encounter of 04/30/24  Resp panel by RT-PCR (RSV, Flu A&B, Covid) Anterior Nasal Swab   Collection Time: 04/30/24  8:50 PM   Specimen: Anterior Nasal Swab  Result Value Ref Range   SARS Coronavirus 2 by RT PCR NEGATIVE NEGATIVE   Influenza A by PCR POSITIVE (A) NEGATIVE   Influenza B by PCR NEGATIVE NEGATIVE   Resp Syncytial Virus by PCR NEGATIVE NEGATIVE  Basic metabolic panel   Collection Time: 04/30/24  8:51 PM  Result Value Ref Range   Sodium 121 (L) 135 - 145 mmol/L   Potassium 2.8 (L) 3.5 - 5.1 mmol/L   Chloride 80 (L) 98 - 111 mmol/L   CO2 26 22 - 32 mmol/L   Glucose, Bld 125 (H) 70 - 99 mg/dL   BUN 24 (H) 8 - 23 mg/dL   Creatinine, Ser 9.15 0.44 - 1.00 mg/dL   Calcium  9.0 8.9 - 10.3 mg/dL   GFR, Estimated >39 >39 mL/min   Anion gap 14 5 - 15  CBC   Collection Time: 04/30/24  8:51 PM  Result Value Ref Range   WBC 7.4 4.0 - 10.5 K/uL  RBC 4.12 3.87 - 5.11 MIL/uL   Hemoglobin 12.3 12.0 - 15.0 g/dL   HCT 65.4 (L) 63.9 - 53.9 %   MCV 83.7 80.0 - 100.0 fL   MCH 29.9 26.0 - 34.0 pg   MCHC 35.7 30.0 - 36.0 g/dL   RDW 87.6 88.4 - 84.4 %   Platelets 349 150 - 400 K/uL   nRBC 0.0 0.0 - 0.2 %  Troponin T, High Sensitivity   Collection Time: 04/30/24  8:51 PM  Result Value Ref Range   Troponin T High Sensitivity <15 0 - 19 ng/L  Magnesium   Collection Time: 04/30/24 10:55 PM  Result Value Ref Range   Magnesium 2.1 1.7 - 2.4 mg/dL  Troponin T, High Sensitivity   Collection Time: 04/30/24 10:55 PM  Result Value Ref Range   Troponin T High Sensitivity <15 0 - 19 ng/L  TSH   Collection Time: 05/01/24 12:29 AM  Result Value Ref Range   TSH 5.880 (H) 0.350 - 4.500 uIU/mL  Osmolality   Collection Time: 05/01/24 12:31 AM  Result Value Ref Range   Osmolality 252 (L) 275 - 295 mOsm/kg  Basic metabolic panel   Collection Time: 05/01/24  2:56 AM  Result Value Ref Range   Sodium 120 (L) 135 - 145 mmol/L    Potassium 4.0 3.5 - 5.1 mmol/L   Chloride 84 (L) 98 - 111 mmol/L   CO2 24 22 - 32 mmol/L   Glucose, Bld 141 (H) 70 - 99 mg/dL   BUN 19 8 - 23 mg/dL   Creatinine, Ser 9.40 0.44 - 1.00 mg/dL   Calcium  8.3 (L) 8.9 - 10.3 mg/dL   GFR, Estimated >39 >39 mL/min   Anion gap 12 5 - 15  Basic metabolic panel   Collection Time: 05/01/24  5:00 AM  Result Value Ref Range   Sodium 121 (L) 135 - 145 mmol/L   Potassium 3.9 3.5 - 5.1 mmol/L   Chloride 84 (L) 98 - 111 mmol/L   CO2 22 22 - 32 mmol/L   Glucose, Bld 147 (H) 70 - 99 mg/dL   BUN 18 8 - 23 mg/dL   Creatinine, Ser 9.40 0.44 - 1.00 mg/dL   Calcium  8.4 (L) 8.9 - 10.3 mg/dL   GFR, Estimated >39 >39 mL/min   Anion gap 15 5 - 15  Osmolality, urine   Collection Time: 05/01/24  9:00 AM  Result Value Ref Range   Osmolality, Ur 199 (L) 300 - 900 mOsm/kg  Sodium, urine, random   Collection Time: 05/01/24  9:00 AM  Result Value Ref Range   Sodium, Ur <30 mmol/L  Basic metabolic panel   Collection Time: 05/01/24  9:58 AM  Result Value Ref Range   Sodium 124 (L) 135 - 145 mmol/L   Potassium 3.8 3.5 - 5.1 mmol/L   Chloride 87 (L) 98 - 111 mmol/L   CO2 22 22 - 32 mmol/L   Glucose, Bld 276 (H) 70 - 99 mg/dL   BUN 15 8 - 23 mg/dL   Creatinine, Ser 9.28 0.44 - 1.00 mg/dL   Calcium  8.7 (L) 8.9 - 10.3 mg/dL   GFR, Estimated >39 >39 mL/min   Anion gap 16 (H) 5 - 15  HIV Antibody (routine testing w rflx)   Collection Time: 05/02/24  5:52 AM  Result Value Ref Range   HIV Screen 4th Generation wRfx Non Reactive Non Reactive  CBC   Collection Time: 05/02/24  5:52 AM  Result Value Ref Range  WBC 6.5 4.0 - 10.5 K/uL   RBC 3.76 (L) 3.87 - 5.11 MIL/uL   Hemoglobin 11.1 (L) 12.0 - 15.0 g/dL   HCT 67.3 (L) 63.9 - 53.9 %   MCV 86.7 80.0 - 100.0 fL   MCH 29.5 26.0 - 34.0 pg   MCHC 34.0 30.0 - 36.0 g/dL   RDW 87.3 88.4 - 84.4 %   Platelets 356 150 - 400 K/uL   nRBC 0.0 0.0 - 0.2 %  Basic metabolic panel with GFR   Collection Time: 05/02/24  5:52  AM  Result Value Ref Range   Sodium 136 135 - 145 mmol/L   Potassium 3.5 3.5 - 5.1 mmol/L   Chloride 100 98 - 111 mmol/L   CO2 27 22 - 32 mmol/L   Glucose, Bld 131 (H) 70 - 99 mg/dL   BUN 11 8 - 23 mg/dL   Creatinine, Ser 9.36 0.44 - 1.00 mg/dL   Calcium  8.5 (L) 8.9 - 10.3 mg/dL   GFR, Estimated >39 >39 mL/min   Anion gap 8 5 - 15      Assessment & Plan:   Assessment & Plan Hyponatremia Admission dx, hospital follow up. Recheck labs. Orders:   Basic Metabolic Panel (BMET)  Bronchitis with flu Admission dx, hospital f/u.  Recheck labs.  Feeling better but cough ongoing with rhonchi on auscultation.  Will send sterapred dose pack and refill cough syrup.   Orders:   CBC w/Diff   Dextromethorphan -guaiFENesin  20-200 MG/20ML LIQD; Take 10 mLs by mouth every 4 (four) hours as needed for cough.   predniSONE  (STERAPRED UNI-PAK 21 TAB) 10 MG (21) TBPK tablet; Take 6 tablets (60 mg total) by mouth daily for 1 day, THEN 5 tablets (50 mg total) daily for 1 day, THEN 4 tablets (40 mg total) daily for 1 day, THEN 3 tablets (30 mg total) daily for 1 day, THEN 2 tablets (20 mg total) daily for 1 day, THEN 1 tablet (10 mg total) daily for 1 day.  Essential hypertension Hospitalist told pt to discontinue hydrochlorothiazide  due to hyponatremia.  Pt states her bp has been controlled since admission and she is just taking hydralazine  as needed.  Encouraged her to take her bp at home and bring log to appt next week with Darice, NP.     Follow up plan: Please follow up next week with Darice, NP.      Transition of Care Hospital Follow up.   Hospital/Facility: [facility] D/C Physician: Caleen, MD D/C Date: 05/02/24  Records Requested: available Records Received:  Records Reviewed: 05/09/24  Diagnoses on Discharge: bronchitis, hyponatremia  Date of interactive Contact within 48 hours of discharge:  Contact was through: other  Date of 7 day or 14 day face-to-face visit:    within 7  days  Outpatient Encounter Medications as of 05/09/2024  Medication Sig Note   calcium  carbonate (OS-CAL) 600 MG TABS tablet Take 1 tablet by mouth daily with breakfast.  08/10/2014: Received from: [facility] Received Sig:    cyanocobalamin  (VITAMIN B12) 1000 MCG tablet Take 1,000 mcg by mouth daily.    Dextromethorphan -guaiFENesin  20-200 MG/20ML LIQD Take 10 mLs by mouth every 4 (four) hours as needed for cough.    hydrALAZINE  (APRESOLINE ) 10 MG tablet TAKE 1 TABLET BY MOUTH 3 TIMES  DAILY    Multiple Vitamin tablet Take 1 tablet by mouth daily. 08/10/2014: Received from: [facility] Received Sig:    omeprazole (PRILOSEC) 20 MG capsule Take 1 capsule by mouth daily.  08/10/2014: Received from: [facility] Received Sig:    rosuvastatin  (CRESTOR ) 10 MG tablet TAKE 1 TABLET BY MOUTH DAILY    telmisartan  (MICARDIS ) 80 MG tablet TAKE 1 TABLET BY MOUTH DAILY    ursodiol  (ACTIGALL ) 300 MG capsule Take 2 capsules by mouth 2 (two) times daily. 08/10/2014: Received from: [facility] Received Sig:    No facility-administered encounter medications on file as of 05/09/2024.    Diagnostic Tests Reviewed/Disposition: labs/and d/c home  Consults: n/a  Discharge Instructions  Disease/illness Education: n/a  Home Health/Community Services Discussions/Referrals: n/a  Establishment or re-establishment of referral orders for community resources: n/a  Discussion with other health care providers: n/a  Assessment and Support of treatment regimen adherence: n/a  Appointments Coordinated with: n/a  Education for self-management, independent living, and ADLs: spouse caring for her   "

## 2024-05-09 NOTE — Assessment & Plan Note (Signed)
 Admission dx, hospital follow up. Recheck labs. Orders:   Basic Metabolic Panel (BMET)

## 2024-05-10 LAB — CBC WITH DIFFERENTIAL/PLATELET
Basophils Absolute: 0 x10E3/uL (ref 0.0–0.2)
Basos: 0 %
EOS (ABSOLUTE): 0 x10E3/uL (ref 0.0–0.4)
Eos: 0 %
Hematocrit: 36.8 % (ref 34.0–46.6)
Hemoglobin: 11.8 g/dL (ref 11.1–15.9)
Immature Grans (Abs): 0.1 x10E3/uL (ref 0.0–0.1)
Immature Granulocytes: 1 %
Lymphocytes Absolute: 1.6 x10E3/uL (ref 0.7–3.1)
Lymphs: 16 %
MCH: 29.8 pg (ref 26.6–33.0)
MCHC: 32.1 g/dL (ref 31.5–35.7)
MCV: 93 fL (ref 79–97)
Monocytes Absolute: 0.9 x10E3/uL (ref 0.1–0.9)
Monocytes: 9 %
Neutrophils Absolute: 7.7 x10E3/uL — ABNORMAL HIGH (ref 1.4–7.0)
Neutrophils: 74 %
Platelets: 448 x10E3/uL (ref 150–450)
RBC: 3.96 x10E6/uL (ref 3.77–5.28)
RDW: 13.4 % (ref 11.7–15.4)
WBC: 10.4 x10E3/uL (ref 3.4–10.8)

## 2024-05-10 LAB — BASIC METABOLIC PANEL WITH GFR
BUN/Creatinine Ratio: 14 (ref 12–28)
BUN: 9 mg/dL (ref 8–27)
CO2: 26 mmol/L (ref 20–29)
Calcium: 8.7 mg/dL (ref 8.7–10.3)
Chloride: 100 mmol/L (ref 96–106)
Creatinine, Ser: 0.64 mg/dL (ref 0.57–1.00)
Glucose: 97 mg/dL (ref 70–99)
Potassium: 3.8 mmol/L (ref 3.5–5.2)
Sodium: 141 mmol/L (ref 134–144)
eGFR: 97 mL/min/1.73

## 2024-05-11 ENCOUNTER — Ambulatory Visit: Payer: Self-pay | Admitting: Family Medicine

## 2024-05-15 ENCOUNTER — Encounter: Payer: Self-pay | Admitting: Nurse Practitioner

## 2024-05-15 ENCOUNTER — Ambulatory Visit (INDEPENDENT_AMBULATORY_CARE_PROVIDER_SITE_OTHER): Admitting: Nurse Practitioner

## 2024-05-15 VITALS — BP 130/82 | HR 74 | Temp 98.1°F | Ht 67.01 in | Wt 217.4 lb

## 2024-05-15 DIAGNOSIS — Z09 Encounter for follow-up examination after completed treatment for conditions other than malignant neoplasm: Secondary | ICD-10-CM | POA: Diagnosis not present

## 2024-05-15 DIAGNOSIS — J101 Influenza due to other identified influenza virus with other respiratory manifestations: Secondary | ICD-10-CM

## 2024-05-15 NOTE — Progress Notes (Signed)
 "  BP 130/82 Comment: Home blood pressure reading  Pulse 74   Temp 98.1 F (36.7 C) (Oral)   Ht 5' 7.01 (1.702 m)   Wt 217 lb 6.4 oz (98.6 kg)   SpO2 97%   BMI 34.04 kg/m    Subjective:    Patient ID: Michaela Hardy, female    DOB: 11/08/1955, 69 y.o.   MRN: [id number]  HPI: Michaela Hardy is a 69 y.o. female  Chief Complaint  Patient presents with   Hospitalization Follow-up   Transition of Care Hospital Follow up.   Hospital/Facility: [facility] D/C Physician: Dr. Caleen D/C Date: 05/02/24  Records Requested: NA Records Received: yes Records Reviewed: Yes  Diagnoses on Discharge:    Hyponatremia Hypoosmolar hyponatremia.  Likely multifactorial with free water intake and HCTZ both.  HCTZ has been discontinued. Sodium with some improvement to 124 with normal saline. - Continue with normal saline for another day -Patient was advised to use rehydration fluid instead of free water   Influenza A Coarse breath sounds with concern of developing bronchitis - Continue with Tamiflu  for 5 days -DuoNeb Q6 hourly -Started on Z-Pak and prednisone  Current-continue with supportive care   Essential hypertension - Continue home irbesartan  and hydralazine -hydralazine  dose can be increased if needed Patient is checking blood pressure at home.  130/80s at home.     Hypokalemia Improved with repletion. - Continue to monitor   Hyperlipidemia - Continue home Crestor    Date of interactive Contact within 48 hours of discharge:  Contact was through: none  Date of 7 day or 14 day face-to-face visit:    within 14 days  Outpatient Encounter Medications as of 05/15/2024  Medication Sig Note   calcium  carbonate (OS-CAL) 600 MG TABS tablet Take 1 tablet by mouth daily with breakfast.  08/10/2014: Received from: [facility] Received Sig:    cyanocobalamin  (VITAMIN B12) 1000 MCG tablet Take 1,000 mcg by mouth daily.    hydrALAZINE  (APRESOLINE ) 10 MG tablet TAKE 1 TABLET BY  MOUTH 3 TIMES  DAILY    Multiple Vitamin tablet Take 1 tablet by mouth daily. 08/10/2014: Received from: [facility] Received Sig:    omeprazole (PRILOSEC) 20 MG capsule Take 1 capsule by mouth daily. 08/10/2014: Received from: [facility] Received Sig:    rosuvastatin  (CRESTOR ) 10 MG tablet TAKE 1 TABLET BY MOUTH DAILY    telmisartan  (MICARDIS ) 80 MG tablet TAKE 1 TABLET BY MOUTH DAILY    ursodiol  (ACTIGALL ) 300 MG capsule Take 2 capsules by mouth 2 (two) times daily. 08/10/2014: Received from: [facility] Received Sig:    Dextromethorphan -guaiFENesin  20-200 MG/20ML LIQD Take 10 mLs by mouth every 4 (four) hours as needed for cough.    predniSONE  (STERAPRED UNI-PAK 21 TAB) 10 MG (21) TBPK tablet Take 6 tablets (60 mg total) by mouth daily for 1 day, THEN 5 tablets (50 mg total) daily for 1 day, THEN 4 tablets (40 mg total) daily for 1 day, THEN 3 tablets (30 mg total) daily for 1 day, THEN 2 tablets (20 mg total) daily for 1 day, THEN 1 tablet (10 mg total) daily for 1 day.    No facility-administered encounter medications on file as of 05/15/2024.    Diagnostic Tests Reviewed/Disposition:   Consults:  Discharge Instructions  Disease/illness Education:  Home Health/Community Services Discussions/Referrals:  Establishment or re-establishment of referral orders for community resources:  Discussion with other health care providers:  Assessment and Support of treatment regimen adherence:  Appointments  Coordinated with:   Education for self-management, independent living, and ADLs:   Relevant past medical, surgical, family and social history reviewed and updated as indicated. Interim medical history since our last visit reviewed. Allergies and medications reviewed and updated.  Review of Systems  Respiratory:  Positive for cough and wheezing.     Per HPI unless specifically indicated above     Objective:    BP 130/82 Comment: Home  blood pressure reading  Pulse 74   Temp 98.1 F (36.7 C) (Oral)   Ht 5' 7.01 (1.702 m)   Wt 217 lb 6.4 oz (98.6 kg)   SpO2 97%   BMI 34.04 kg/m   Wt Readings from Last 3 Encounters:  05/15/24 217 lb 6.4 oz (98.6 kg)  05/09/24 216 lb 12.8 oz (98.3 kg)  05/01/24 238 lb 9.6 oz (108.2 kg)    Physical Exam Vitals and nursing note reviewed.  Constitutional:      General: She is not in acute distress.    Appearance: Normal appearance. She is normal weight. She is not ill-appearing, toxic-appearing or diaphoretic.  HENT:     Head: Normocephalic.     Right Ear: External ear normal.     Left Ear: External ear normal.     Nose: Nose normal.     Mouth/Throat:     Mouth: Mucous membranes are moist.     Pharynx: Oropharynx is clear.  Eyes:     General:        Right eye: No discharge.        Left eye: No discharge.     Extraocular Movements: Extraocular movements intact.     Conjunctiva/sclera: Conjunctivae normal.     Pupils: Pupils are equal, round, and reactive to light.  Cardiovascular:     Rate and Rhythm: Normal rate and regular rhythm.     Heart sounds: No murmur heard. Pulmonary:     Effort: Pulmonary effort is normal. No respiratory distress.     Breath sounds: Wheezing present. No rales.  Musculoskeletal:     Cervical back: Normal range of motion and neck supple.  Skin:    General: Skin is warm and dry.     Capillary Refill: Capillary refill takes less than 2 seconds.  Neurological:     General: No focal deficit present.     Mental Status: She is alert and oriented to person, place, and time. Mental status is at baseline.  Psychiatric:        Mood and Affect: Mood normal.        Behavior: Behavior normal.        Thought Content: Thought content normal.        Judgment: Judgment normal.     Results for orders placed or performed in visit on 05/09/24  CBC w/Diff   Collection Time: 05/09/24 11:09 AM  Result Value Ref Range   WBC 10.4 3.4 - 10.8 x10E3/uL   RBC  3.96 3.77 - 5.28 x10E6/uL   Hemoglobin 11.8 11.1 - 15.9 g/dL   Hematocrit 63.1 65.9 - 46.6 %   MCV 93 79 - 97 fL   MCH 29.8 26.6 - 33.0 pg   MCHC 32.1 31.5 - 35.7 g/dL   RDW 86.5 88.2 - 84.5 %   Platelets 448 150 - 450 x10E3/uL   Neutrophils 74 Not Estab. %   Lymphs 16 Not Estab. %   Monocytes 9 Not Estab. %   Eos 0 Not Estab. %   Basos 0 Not Estab. %  Neutrophils Absolute 7.7 (H) 1.4 - 7.0 x10E3/uL   Lymphocytes Absolute 1.6 0.7 - 3.1 x10E3/uL   Monocytes Absolute 0.9 0.1 - 0.9 x10E3/uL   EOS (ABSOLUTE) 0.0 0.0 - 0.4 x10E3/uL   Basophils Absolute 0.0 0.0 - 0.2 x10E3/uL   Immature Granulocytes 1 Not Estab. %   Immature Grans (Abs) 0.1 0.0 - 0.1 x10E3/uL  Basic Metabolic Panel (BMET)   Collection Time: 05/09/24 11:09 AM  Result Value Ref Range   Glucose 97 70 - 99 mg/dL   BUN 9 8 - 27 mg/dL   Creatinine, Ser 9.35 0.57 - 1.00 mg/dL   eGFR 97 >40 fO/fpw/8.26   BUN/Creatinine Ratio 14 12 - 28   Sodium 141 134 - 144 mmol/L   Potassium 3.8 3.5 - 5.2 mmol/L   Chloride 100 96 - 106 mmol/L   CO2 26 20 - 29 mmol/L   Calcium  8.7 8.7 - 10.3 mg/dL      Assessment & Plan:   Problem List Items Addressed This Visit       Respiratory   Influenza A   Other Visit Diagnoses       Hospital discharge follow-up    -  Primary   CMP checked at visit today.  Continue without HCTZ.  Doing well. Completed course of prednisone . Follow up in 2 months.   Relevant Orders   Comp Met (CMET)        Follow up plan: Return in about 2 months (around 07/13/2024) for BP Check.      "

## 2024-05-16 ENCOUNTER — Ambulatory Visit: Payer: Self-pay | Admitting: Nurse Practitioner

## 2024-05-16 LAB — COMPREHENSIVE METABOLIC PANEL WITH GFR
ALT: 47 IU/L — ABNORMAL HIGH (ref 0–32)
AST: 14 IU/L (ref 0–40)
Albumin: 4.1 g/dL (ref 3.9–4.9)
Alkaline Phosphatase: 120 IU/L (ref 49–135)
BUN/Creatinine Ratio: 20 (ref 12–28)
BUN: 13 mg/dL (ref 8–27)
Bilirubin Total: 0.3 mg/dL (ref 0.0–1.2)
CO2: 24 mmol/L (ref 20–29)
Calcium: 9 mg/dL (ref 8.7–10.3)
Chloride: 103 mmol/L (ref 96–106)
Creatinine, Ser: 0.65 mg/dL (ref 0.57–1.00)
Globulin, Total: 2.3 g/dL (ref 1.5–4.5)
Glucose: 151 mg/dL — ABNORMAL HIGH (ref 70–99)
Potassium: 4.1 mmol/L (ref 3.5–5.2)
Sodium: 142 mmol/L (ref 134–144)
Total Protein: 6.4 g/dL (ref 6.0–8.5)
eGFR: 96 mL/min/1.73

## 2024-05-22 ENCOUNTER — Ambulatory Visit
Admission: RE | Admit: 2024-05-22 | Discharge: 2024-05-22 | Disposition: A | Source: Ambulatory Visit | Attending: Obstetrics and Gynecology

## 2024-05-22 DIAGNOSIS — Z1231 Encounter for screening mammogram for malignant neoplasm of breast: Secondary | ICD-10-CM

## 2024-06-02 ENCOUNTER — Other Ambulatory Visit: Payer: Self-pay | Admitting: Nurse Practitioner

## 2024-06-04 NOTE — Telephone Encounter (Signed)
 Requested Prescriptions  Pending Prescriptions Disp Refills   hydrALAZINE  (APRESOLINE ) 10 MG tablet [Pharmacy Med Name: hydrALAZINE  HCl 10 MG Oral Tablet] 270 tablet 0    Sig: TAKE 1 TABLET BY MOUTH 3 TIMES  DAILY     Cardiovascular:  Vasodilators Failed - 06/04/2024 10:36 AM      Failed - ANA Screen, Ifa, Serum in normal range and within 360 days    No results found for: ANA, ANATITER, LABANTI       Passed - HCT in normal range and within 360 days    Hematocrit  Date Value Ref Range Status  05/09/2024 36.8 34.0 - 46.6 % Final         Passed - HGB in normal range and within 360 days    Hemoglobin  Date Value Ref Range Status  05/09/2024 11.8 11.1 - 15.9 g/dL Final         Passed - RBC in normal range and within 360 days    RBC  Date Value Ref Range Status  05/09/2024 3.96 3.77 - 5.28 x10E6/uL Final  05/02/2024 3.76 (L) 3.87 - 5.11 MIL/uL Final         Passed - WBC in normal range and within 360 days    WBC  Date Value Ref Range Status  05/09/2024 10.4 3.4 - 10.8 x10E3/uL Final  05/02/2024 6.5 4.0 - 10.5 K/uL Final         Passed - PLT in normal range and within 360 days    Platelets  Date Value Ref Range Status  05/09/2024 448 150 - 450 x10E3/uL Final         Passed - Last BP in normal range    BP Readings from Last 1 Encounters:  05/15/24 130/82         Passed - Valid encounter within last 12 months    Recent Outpatient Visits           2 weeks ago Hospital discharge follow-up   [place] [facility] [name], NP   3 weeks ago Hyponatremia   [place] [facility] Con Delon HERO, FNP   3 months ago Biliary cirrhosis [facility])   [place] [facility] [name], NP   7 months ago Dizziness   [place] [facility] [name], NP   8 months ago Dizziness   [place] [facility] [place], Megan P, DO               rosuvastatin  (CRESTOR ) 10 MG tablet  [Pharmacy Med Name: Rosuvastatin  Calcium  10 MG Oral Tablet] 90 tablet 0    Sig: TAKE 1 TABLET BY MOUTH DAILY     Cardiovascular:  Antilipid - Statins 2 Failed - 06/04/2024 10:36 AM      Failed - Lipid Panel in normal range within the last 12 months    Cholesterol, Total  Date Value Ref Range Status  03/06/2024 182 100 - 199 mg/dL Final   LDL Chol Calc (NIH)  Date Value Ref Range Status  03/06/2024 95 0 - 99 mg/dL Final   HDL  Date Value Ref Range Status  03/06/2024 63 >39 mg/dL Final   Triglycerides  Date Value Ref Range Status  03/06/2024 140 0 - 149 mg/dL Final         Passed - Cr in normal range and within 360 days    Creatinine, Ser  Date Value Ref Range Status  05/15/2024 0.65 0.57 - 1.00 mg/dL Final  Passed - Patient is not pregnant      Passed - Valid encounter within last 12 months    Recent Outpatient Visits           2 weeks ago Hospital discharge follow-up   [place] [facility] [name], NP   3 weeks ago Hyponatremia   [place] [facility] Con Delon HERO, FNP   3 months ago Biliary cirrhosis [facility])   [place] [facility] [name], NP   7 months ago Dizziness   [place] [facility] [name], NP   8 months ago Dizziness   [place] [facility] [place], [place], [place]

## 2024-06-05 ENCOUNTER — Encounter: Payer: Self-pay | Admitting: Nurse Practitioner

## 2024-06-06 ENCOUNTER — Encounter: Payer: Self-pay | Admitting: Nurse Practitioner

## 2024-06-06 ENCOUNTER — Other Ambulatory Visit: Payer: Self-pay | Admitting: Nurse Practitioner

## 2024-06-06 ENCOUNTER — Telehealth: Admitting: Nurse Practitioner

## 2024-06-06 VITALS — BP 168/82 | Wt 218.0 lb

## 2024-06-06 DIAGNOSIS — I10 Essential (primary) hypertension: Secondary | ICD-10-CM

## 2024-06-06 MED ORDER — HYDRALAZINE HCL 25 MG PO TABS: 25.0000 mg | ORAL_TABLET | Freq: Three times a day (TID) | ORAL | 0 refills | Status: DC

## 2024-06-06 NOTE — Progress Notes (Signed)
 "  BP (!) 168/82 (BP Location: Left Arm)   Wt 218 lb (98.9 kg)   BMI 34.14 kg/m    Subjective:    Patient ID: Michaela Hardy, female    DOB: Mar 21, 1956, 69 y.o.   MRN: [id number]  HPI: Michaela Hardy is a 69 y.o. female  Chief Complaint  Patient presents with   office visit    High BP. Patient stated she noticed it back on 05/15/24 & she was told to keep track of it.  She is experiencing headaches as well.   HYPERTENSION / HYPERLIPIDEMIA Patient states she is doing well.  She has been more stressed lately which is likely why her blood pressure is up. She is taking care of her mom and mother in law who require a lot of her time.   Satisfied with current treatment? yes Duration of hypertension: years BP monitoring frequency: not checking BP range:  BP medication side effects: no Past BP meds: Telmisartan  and HCTZ Duration of hyperlipidemia: years Cholesterol medication side effects: no Cholesterol supplements: none Past cholesterol medications: rosuvastatin  (crestor ) Medication compliance: excellent compliance Aspirin: no Recent stressors: no Recurrent headaches: no Visual changes: no Palpitations: no Dyspnea: no Chest pain: no Lower extremity edema: no Dizzy/lightheaded: no   Relevant past medical, surgical, family and social history reviewed and updated as indicated. Interim medical history since our last visit reviewed. Allergies and medications reviewed and updated.  Review of Systems  Eyes:  Negative for visual disturbance.  Respiratory:  Negative for cough, chest tightness and shortness of breath.   Cardiovascular:  Negative for chest pain, palpitations and leg swelling.  Neurological:  Negative for dizziness and headaches.    Per HPI unless specifically indicated above     Objective:    BP (!) 168/82 (BP Location: Left Arm)   Wt 218 lb (98.9 kg)   BMI 34.14 kg/m   Wt Readings from Last 3 Encounters:  06/06/24 218 lb (98.9 kg)  05/15/24 217 lb 6.4 oz  (98.6 kg)  05/09/24 216 lb 12.8 oz (98.3 kg)    Physical Exam Vitals and nursing note reviewed.  HENT:     Head: Normocephalic.     Right Ear: Hearing normal.     Left Ear: Hearing normal.     Nose: Nose normal.  Eyes:     Pupils: Pupils are equal, round, and reactive to light.  Pulmonary:     Effort: Pulmonary effort is normal. No respiratory distress.  Neurological:     Mental Status: She is alert.  Psychiatric:        Mood and Affect: Mood normal.        Behavior: Behavior normal.        Thought Content: Thought content normal.        Judgment: Judgment normal.     Results for orders placed or performed in visit on 05/15/24  Comp Met (CMET)   Collection Time: 05/15/24  1:19 PM  Result Value Ref Range   Glucose 151 (H) 70 - 99 mg/dL   BUN 13 8 - 27 mg/dL   Creatinine, Ser 9.34 0.57 - 1.00 mg/dL   eGFR 96 >40 fO/fpw/8.26   BUN/Creatinine Ratio 20 12 - 28   Sodium 142 134 - 144 mmol/L   Potassium 4.1 3.5 - 5.2 mmol/L   Chloride 103 96 - 106 mmol/L   CO2 24 20 - 29 mmol/L   Calcium  9.0 8.7 - 10.3 mg/dL   Total Protein 6.4 6.0 - 8.5  g/dL   Albumin 4.1 3.9 - 4.9 g/dL   Globulin, Total 2.3 1.5 - 4.5 g/dL   Bilirubin Total 0.3 0.0 - 1.2 mg/dL   Alkaline Phosphatase 120 49 - 135 IU/L   AST 14 0 - 40 IU/L   ALT 47 (H) 0 - 32 IU/L      Assessment & Plan:   Problem List Items Addressed This Visit       Cardiovascular and Mediastinum   Essential hypertension - Primary   Chronic. Not well controlled.  Will increase hydralazine  to 25mg  TID.  Continue with Telmisartan  80mg  daily.  Follow up in 3 weeks.  Continue to check blood pressures at home.       Relevant Medications   hydrALAZINE  (APRESOLINE ) 25 MG tablet     Follow up plan: Return in about 3 weeks (around 06/27/2024) for BP Check (virtual).   This visit was completed via MyChart due to the restrictions of the COVID-19 pandemic. All issues as above were discussed and addressed. Physical exam was done as above  through visual confirmation on MyChart. If it was felt that the patient should be evaluated in the office, they were directed there. The patient verbally consented to this visit. Location of the patient: Home Location of the provider: Office Those involved with this call:  Provider: Darice Petty, NP CMA: Joya Louder, CMA Front Desk/Registration: Claretta Maiden This encounter was conducted via video.  I spent  30 minutes  dedicated to the care of this patient on the date of this encounter to include previsit review of plan of care, medications and follow up, face to face time with the patient, and post visit ordering of testing.      "

## 2024-06-06 NOTE — Assessment & Plan Note (Signed)
 Chronic. Not well controlled.  Will increase hydralazine  to 25mg  TID.  Continue with Telmisartan  80mg  daily.  Follow up in 3 weeks.  Continue to check blood pressures at home.

## 2024-06-07 ENCOUNTER — Encounter: Payer: Self-pay | Admitting: Nurse Practitioner

## 2024-06-08 MED ORDER — HYDRALAZINE HCL 25 MG PO TABS: 25.0000 mg | ORAL_TABLET | Freq: Three times a day (TID) | ORAL | 0 refills | Status: AC

## 2024-06-08 MED ORDER — HYDRALAZINE HCL 25 MG PO TABS: 25.0000 mg | ORAL_TABLET | Freq: Three times a day (TID) | ORAL | 0 refills | Status: DC

## 2024-07-10 ENCOUNTER — Ambulatory Visit: Payer: Medicare Other

## 2024-09-04 ENCOUNTER — Ambulatory Visit: Admitting: Nurse Practitioner
